# Patient Record
Sex: Female | Born: 1945
Health system: Southern US, Community
[De-identification: ages and names within clinical notes are randomized; demographics above are authoritative.]

## PROBLEM LIST (undated history)

## (undated) DIAGNOSIS — H9313 Tinnitus, bilateral: Secondary | ICD-10-CM

## (undated) DIAGNOSIS — D649 Anemia, unspecified: Secondary | ICD-10-CM

## (undated) DIAGNOSIS — I10 Essential (primary) hypertension: Secondary | ICD-10-CM

## (undated) DIAGNOSIS — R9431 Abnormal electrocardiogram [ECG] [EKG]: Secondary | ICD-10-CM

## (undated) DIAGNOSIS — Z789 Other specified health status: Secondary | ICD-10-CM

## (undated) DIAGNOSIS — E785 Hyperlipidemia, unspecified: Secondary | ICD-10-CM

## (undated) HISTORY — PX: COLONOSCOPY: SHX174

## (undated) HISTORY — DX: Essential (primary) hypertension: I10

## (undated) HISTORY — DX: Abnormal electrocardiogram (ECG) (EKG): R94.31

## (undated) HISTORY — PX: ARM SKIN LESION BIOPSY / EXCISION: SUR471

## (undated) HISTORY — DX: Other specified health status: Z78.9

## (undated) HISTORY — DX: Hyperlipidemia, unspecified: E78.5

---

## 1975-01-03 HISTORY — PX: TUBAL LIGATION: SHX77

## 1983-01-03 DIAGNOSIS — I10 Essential (primary) hypertension: Secondary | ICD-10-CM

## 1983-01-03 HISTORY — DX: Essential (primary) hypertension: I10

## 2000-08-01 ENCOUNTER — Encounter: Payer: Self-pay | Admitting: Family Medicine

## 2000-08-01 ENCOUNTER — Ambulatory Visit (HOSPITAL_COMMUNITY): Admission: RE | Admit: 2000-08-01 | Discharge: 2000-08-01 | Payer: Self-pay | Admitting: Family Medicine

## 2000-08-20 ENCOUNTER — Other Ambulatory Visit: Admission: RE | Admit: 2000-08-20 | Discharge: 2000-08-20 | Payer: Self-pay | Admitting: *Deleted

## 2004-09-26 ENCOUNTER — Emergency Department (HOSPITAL_COMMUNITY): Admission: EM | Admit: 2004-09-26 | Discharge: 2004-09-26 | Payer: Self-pay | Admitting: Emergency Medicine

## 2009-01-02 HISTORY — PX: CATARACT EXTRACTION: SUR2

## 2009-07-10 ENCOUNTER — Emergency Department (HOSPITAL_COMMUNITY): Admission: EM | Admit: 2009-07-10 | Discharge: 2009-07-10 | Payer: Self-pay | Admitting: Emergency Medicine

## 2009-10-21 ENCOUNTER — Encounter: Payer: Self-pay | Admitting: Family Medicine

## 2009-12-02 ENCOUNTER — Ambulatory Visit: Payer: Self-pay | Admitting: Family Medicine

## 2009-12-02 DIAGNOSIS — I10 Essential (primary) hypertension: Secondary | ICD-10-CM | POA: Insufficient documentation

## 2009-12-05 DIAGNOSIS — L0291 Cutaneous abscess, unspecified: Secondary | ICD-10-CM | POA: Insufficient documentation

## 2009-12-05 DIAGNOSIS — L0390 Cellulitis, unspecified: Secondary | ICD-10-CM

## 2009-12-09 ENCOUNTER — Ambulatory Visit (HOSPITAL_COMMUNITY)
Admission: RE | Admit: 2009-12-09 | Discharge: 2009-12-09 | Payer: Self-pay | Source: Home / Self Care | Attending: Ophthalmology | Admitting: Ophthalmology

## 2009-12-16 ENCOUNTER — Ambulatory Visit (HOSPITAL_COMMUNITY)
Admission: RE | Admit: 2009-12-16 | Discharge: 2009-12-16 | Payer: Self-pay | Source: Home / Self Care | Attending: Family Medicine | Admitting: Family Medicine

## 2010-01-10 ENCOUNTER — Telehealth: Payer: Self-pay | Admitting: Family Medicine

## 2010-01-12 ENCOUNTER — Encounter: Payer: Self-pay | Admitting: Family Medicine

## 2010-01-13 ENCOUNTER — Ambulatory Visit (HOSPITAL_COMMUNITY)
Admission: RE | Admit: 2010-01-13 | Discharge: 2010-01-13 | Payer: Self-pay | Source: Home / Self Care | Attending: Ophthalmology | Admitting: Ophthalmology

## 2010-01-14 LAB — CONVERTED CEMR LAB
BUN: 16 mg/dL (ref 6–23)
CO2: 28 meq/L (ref 19–32)
Calcium: 9.8 mg/dL (ref 8.4–10.5)
Chloride: 102 meq/L (ref 96–112)
Creatinine, Ser: 0.96 mg/dL (ref 0.40–1.20)
Glucose, Bld: 94 mg/dL (ref 70–99)
Potassium: 3.4 meq/L — ABNORMAL LOW (ref 3.5–5.3)
Sodium: 141 meq/L (ref 135–145)

## 2010-01-31 ENCOUNTER — Encounter: Payer: Self-pay | Admitting: Family Medicine

## 2010-01-31 ENCOUNTER — Ambulatory Visit: Admit: 2010-01-31 | Payer: Self-pay | Admitting: Family Medicine

## 2010-02-01 NOTE — Letter (Signed)
Summary: Letter to [facility] appt.  Letter to [facility] appt.   Imported By: Curtis Sites 10/22/2009 15:17:06  _____________________________________________________________________  External Attachment:    Type:   Image     Comment:   External Document

## 2010-02-01 NOTE — Assessment & Plan Note (Signed)
Summary: NEW PATIENT   Vital Signs:  Patient profile:   65 year old female Menstrual status:  postmenopausal Height:      66 inches Weight:      184.75 pounds BMI:     29.93 O2 Sat:      98 % on Room air Pulse rate:   82 / minute Pulse rhythm:   regular Resp:     16 per minute BP sitting:   160 / 90  (left arm)  Vitals Entered By: Adella Hare LPN (December 02, 2009 2:37 PM)  Nutrition Counseling: Patient's BMI is greater than 25 and therefore counseled on weight management options.  O2 Flow:  Room air CC: new patient Is Patient Diabetic? No Pain Assessment Patient in pain? no          Menstrual Status postmenopausal   CC:  new patient.  History of Present Illness: Reports  thatshe has been doing well. she has a long h/o htN, recently during preop for surgery, her blood pressure was found to be elevated, she states that often her med makes her "feel badly' when she takes it.  Denies recent fever or chills. Denies sinus pressure, nasal congestion , ear pain or sore throat. Denies chest congestion, or cough productive of sputum. Denies chest pain, palpitations, PND, orthopnea or leg swelling. Denies abdominal pain, nausea, vomitting, diarrhea or constipation. Denies change in bowel movements or bloody stool. Denies dysuria , frequency, incontinence or hesitancy. Denies  joint pain, swelling, or reduced mobility. Denies headaches, vertigo, seizures. Denies depression, anxiety or insomnia. Denies  rash,  or itch.Concerned about an erythematous lesion on right ant chest wall which is painful.     Preventive Screening-Counseling & Management  Alcohol-Tobacco     Smoking Status: never  Caffeine-Diet-Exercise     Does Patient Exercise: no      Drug Use:  no.    Current Medications (verified): 1)  Amlodipine Besylate 10 Mg Tabs (Amlodipine Besylate) .... One Tab By Mouth Once Daily 2)  Maxzide 75-50 Mg Tabs (Triamterene-Hctz) .... One Half By Mouth Once  Daily  Allergies (verified): No Known Drug Allergies  Past History:  Past Medical History: Hypertension  in mid 30's Right cataract  to be extracted in 2011  Past Surgical History: Tubal ligation  Family History: mother deceased- DM, COPD father deceased- pneumonia 3 sisters- HTN x1 3 brothers- DM x1,   Social History: Retired Married 3 grown children Never Smoked Alcohol use-no Drug use-no Regular exercise-no Smoking Status:  never Drug Use:  no Does Patient Exercise:  no  Review of Systems      See HPI General:  Complains of fatigue. Eyes:  Complains of vision loss-both eyes. Derm:  Complains of lesion(s). Endo:  Denies cold intolerance, excessive hunger, excessive thirst, excessive urination, and heat intolerance. Heme:  Denies abnormal bruising and bleeding. Allergy:  Denies hives or rash, itching eyes, and persistent infections.  Physical Exam  General:  Well-developed,well-nourished,in no acute distress; alert,appropriate and cooperative throughout examination HEENT: No facial asymmetry,  EOMI, No sinus tenderness, TM's Clear, oropharynx  pink and moist.   Chest: Clear to auscultation bilaterally.  CVS: S1, S2, No murmurs, No S3.   Abd: Soft, Nontender.  MS: Adequate ROM spine, hips, shoulders and knees.  Ext: No edema.   CNS: CN 2-12 intact, power tone and sensation normal throughout.   Skin: Intact, erythematous purulent appearing lesion on left ant chest. Psych: Good eye contact, normal affect.  Memory intact, not anxious  or depressed appearing.    Impression & Recommendations:  Problem # 1:  HYPERTENSION (ICD-401.9) Assessment Comment Only  Her updated medication list for this problem includes:    Triamterene-hctz 75-50 Mg Tabs (Triamterene-hctz) .Marland Kitchen... Take 1 tablet by mouth once a day    Amlodipine Besylate 10 Mg Tabs (Amlodipine besylate) ..... One tablet at 10pm at night  Orders: Medicare Electronic Prescription [phone])  BP today:  160/90, uncontrolled, dose increase in maxzide instituted   Problem # 2:  Preventive Health Care (ICD-V70.0) Assessment: Comment Only immunization issues addressed  Problem # 3:  CELLULITIS (ICD-682.9) antibiotic oint prescribed  Complete Medication List: 1)  Triamterene-hctz 75-50 Mg Tabs (Triamterene-hctz) .... Take 1 tablet by mouth once a day 2)  Amlodipine Besylate 10 Mg Tabs (Amlodipine besylate) .... One tablet at 10pm at night 3)  Mupirocin 2 % Oint (Mupirocin) .... Apply twice daily to rash on ant chest wall for 5 days , then as needed for infection  Other Orders: Pneumococcal Vaccine (60454) Admin 1st Vaccine (09811) Tdap => 40yrs IM (91478) Admin of Any Addtl Vaccine (29562) Radiology Referral (Radiology)  Patient Instructions: 1)  cPE in 4 to 5 weeks 2)  new dose of the triamterene  75mg  ONE daily 3)  you will be referred for a mamogram 4)  med sent in for rash Prescriptions: MUPIROCIN 2 % OINT (MUPIROCIN) apply twice daily to rash on ant chest wall for 5 days , then as needed for infection  #45 gm x 0   Entered and Authorized by:   Syliva Overman MD   Signed by:   Syliva Overman MD on 12/02/2009   Method used:   Electronically to        [facility] (retail)       [address] [place], [place]  [address]       Ph: [phone]       Fax: [phone]   RxID:   [id number] AMLODIPINE BESYLATE 10 MG TABS (AMLODIPINE BESYLATE) one tablet at 10pm at night  #90 x 0   Entered and Authorized by:   Syliva Overman MD   Signed by:   Syliva Overman MD on 12/02/2009   Method used:   Electronically to        [facility] (retail)       [address] [place], [place]  [address]       Ph: [phone]       Fax: [phone]   RxID:   [id number] TRIAMTERENE-HCTZ 75-50 MG TABS (TRIAMTERENE-HCTZ) Take 1 tablet by mouth once a day  #90 x 0   Entered and Authorized by:   Syliva Overman MD   Signed by:   Syliva Overman MD on 12/02/2009   Method used:   Electronically to        [facility] (retail)       [address]       [place], [place]  [address]       Ph: [phone]       Fax: [phone]   RxID:   [id number]    Orders Added: 1)  New Patient Level IV [99204] 2)  Medicare Electronic Prescription [G8553] 3)  Pneumococcal Vaccine [90732] 4)  Admin 1st Vaccine [90471] 5)  Tdap => 7yrs  IM [90715] 6)  Admin of Any Addtl Vaccine [90472] 7)  Radiology Referral [Radiology]   Immunizations Administered:  Pneumonia Vaccine:    Vaccine Type: Pneumovax    Site: right deltoid    Mfr: Merck    Dose: 0.5 ml    Route: IM    Given by: Adella Hare LPN    Exp. Date: 03/19/2011    Lot #: [id number]    VIS given: 12/07/08 version given December 02, 2009.  Tetanus Vaccine:    Vaccine Type: Tdap    Site: left deltoid    Mfr: GlaxoSmithKline    Dose: 0.5 ml    Route: IM    Given by: Adella Hare LPN    Exp. Date: 10/22/2011    Lot #: [id number]    VIS given: 11/20/07 version given December 02, 2009.   Immunizations Administered:  Pneumonia Vaccine:    Vaccine Type: Pneumovax    Site: right deltoid    Mfr: Merck    Dose: 0.5 ml    Route: IM    Given by: Adella Hare LPN    Exp. Date: 03/19/2011    Lot #: [id number]    VIS given: 12/07/08 version given December 02, 2009.  Tetanus Vaccine:    Vaccine Type: Tdap    Site: left deltoid    Mfr: GlaxoSmithKline    Dose: 0.5 ml    Route: IM    Given by: Adella Hare LPN    Exp. Date: 10/22/2011    Lot #: [id number]    VIS given: 11/20/07 version given December 02, 2009.

## 2010-02-03 ENCOUNTER — Other Ambulatory Visit: Payer: Self-pay | Admitting: Family Medicine

## 2010-02-03 ENCOUNTER — Other Ambulatory Visit (HOSPITAL_COMMUNITY)
Admission: RE | Admit: 2010-02-03 | Discharge: 2010-02-03 | Disposition: A | Discharge status: Home / Self Care | Payer: Medicare FFS | Source: Ambulatory Visit | Attending: Family Medicine | Admitting: Family Medicine

## 2010-02-03 ENCOUNTER — Encounter (INDEPENDENT_AMBULATORY_CARE_PROVIDER_SITE_OTHER): Payer: 59 | Admitting: Family Medicine

## 2010-02-03 ENCOUNTER — Encounter: Payer: Self-pay | Admitting: Family Medicine

## 2010-02-03 DIAGNOSIS — H9209 Otalgia, unspecified ear: Secondary | ICD-10-CM

## 2010-02-03 DIAGNOSIS — R9431 Abnormal electrocardiogram [ECG] [EKG]: Secondary | ICD-10-CM | POA: Insufficient documentation

## 2010-02-03 DIAGNOSIS — R1011 Right upper quadrant pain: Secondary | ICD-10-CM | POA: Insufficient documentation

## 2010-02-03 DIAGNOSIS — Z0000 Encounter for general adult medical examination without abnormal findings: Secondary | ICD-10-CM

## 2010-02-03 DIAGNOSIS — I10 Essential (primary) hypertension: Secondary | ICD-10-CM

## 2010-02-03 DIAGNOSIS — Z01419 Encounter for gynecological examination (general) (routine) without abnormal findings: Secondary | ICD-10-CM | POA: Insufficient documentation

## 2010-02-03 HISTORY — DX: Abnormal electrocardiogram (ECG) (EKG): R94.31

## 2010-02-03 NOTE — Progress Notes (Signed)
Summary: blood pressure medicine  Phone Note Call from Patient   Summary of Call: her blood pressure medicine is making her hands and legs cramp up and would like a different kind of medicine send to [place] [facility] call back at [phone] to let her know Initial call taken by: Lind Guest,  January 10, 2010 1:53 PM  Follow-up for Phone Call        The maxzide is causing her to have cramps and she wants it changed to something else. [place] Follow-up by: Everitt Amber LPN,  January 10, 2010 2:01 PM  Additional Follow-up for Phone Call Additional follow up Details #1::        I recommend blood work to include chem 7 her potassium is likely low, also if she is willing to take potassium one daily with this it is worth tring before changing her med, she has an appt in 3 days , see if she will get chem 7 today or tomorrow , pls order, not as a stat, fax in potassium entered historically if she agrees, pls document if she does not Additional Follow-up by: Syliva Overman MD,  January 10, 2010 2:25 PM   New Allergies: ! MAXZIDE Additional Follow-up for Phone Call Additional follow up Details #2::    patient is not willing to do blood work or take potassium, states she does not like the bp med and doesnt want to take it Follow-up by: Adella Hare LPN,  January 11, 2010 9:29 AM  Additional Follow-up for Phone Call Additional follow up Details #3:: Details for Additional Follow-up Action Taken: advise pt and send order to pharmacy to d/s the maxzide , enter adverse s/e pls of cramps, pls ssen in benazeprill 20mg  one daily # 30 refill one in its place, pls let her knowif she develops , dry cough , or swelling of the lips this is an unwanted adverse side effect, she willneed to stop the med and let me know  Additional Follow-up by: Syliva Overman MD,  January 12, 2010 4:21 AM  New/Updated Medications: BENAZEPRIL HCL 20 MG TABS (BENAZEPRIL HCL) Take 1 tablet by mouth once a day KLOR-CON M20 20 MEQ  CR-TABS (POTASSIUM CHLORIDE CRYS CR) Take 1 tablet by mouth once a day New Allergies: ! MAXZIDEPrescriptions: BENAZEPRIL HCL 20 MG TABS (BENAZEPRIL HCL) Take 1 tablet by mouth once a day  #30 x 1   Entered by:   Everitt Amber LPN   Authorized by:   Syliva Overman MD   Signed by:   Everitt Amber LPN on 45/40/9811   Method used:   Printed then faxed to ...       [facility] (retail)       [address]       [place], [place]  [address]       Ph: [phone]       Fax: [phone]   RxID:   [id number] KLOR-CON M20 20 MEQ CR-TABS (POTASSIUM CHLORIDE CRYS CR) Take 1 tablet by mouth once a day  #30 x 2   Entered and Authorized by:   Syliva Overman MD   Signed by:   Syliva Overman MD on 01/10/2010   Method used:   Historical   RxID:   [id number]  New med sent in with letter to d/c maxzide. Patient did agree to get lab to check her potassium.

## 2010-02-03 NOTE — Letter (Signed)
Summary: Letter  Letter   Imported By: Lind Guest 01/12/2010 13:05:24  _____________________________________________________________________  External Attachment:    Type:   Image     Comment:   External Document

## 2010-02-04 ENCOUNTER — Other Ambulatory Visit: Payer: Self-pay | Admitting: Family Medicine

## 2010-02-04 DIAGNOSIS — R109 Unspecified abdominal pain: Secondary | ICD-10-CM

## 2010-02-07 ENCOUNTER — Encounter (INDEPENDENT_AMBULATORY_CARE_PROVIDER_SITE_OTHER): Payer: Self-pay

## 2010-02-08 ENCOUNTER — Ambulatory Visit (HOSPITAL_COMMUNITY): Payer: Medicare FFS

## 2010-02-09 NOTE — Assessment & Plan Note (Signed)
Summary: physical   Vital Signs:  Patient profile:   65 year old female Menstrual status:  postmenopausal Height:      66 inches Weight:      182 pounds BMI:     29.48 O2 Sat:      98 % Pulse rate:   74 / minute Pulse rhythm:   regular Resp:     16 per minute BP sitting:   160 / 92  (left arm) Cuff size:   regular  Vitals Entered By: Everitt Amber LPN (February 03, 2010 8:21 AM)  Nutrition Counseling: Patient's BMI is greater than 25 and therefore counseled on weight management options. CC: CPE   Vision Screening:Left eye w/o correction: 20 / 20 Right Eye w/o correction: 20 / 20 Both eyes w/o correction:  20/ 20  Color vision testing: normal      Vision Entered By: Everitt Amber LPN (February 03, 2010 8:30 AM)   CC:  CPE .  History of Present Illness: Reports  that she has been  doing well. Denies recent fever or chills. Denies sinus pressure, nasal congestion , ear pain or sore throat. Denies chest congestion, or cough productive of sputum. Denies palpitations, PND, orthopnea or leg swelling.Does have intermittent left chest discomfort Denies , nausea, vomitting, diarrhea or constipation. Denies change in bowel movements or bloody stool. Denies dysuria , frequency, incontinence or hesitancy. Denies  joint pain, swelling, or reduced mobility. Denies headaches, vertigo, seizures. Denies depression, anxiety or insomnia. Denies  rash, lesions, or itch.     Current Medications (verified): 1)  Benazepril Hcl 20 Mg Tabs (Benazepril Hcl) .... Take 1 Tablet By Mouth Once A Day 2)  Amlodipine Besylate 10 Mg Tabs (Amlodipine Besylate) .... One Tablet At 10pm At Night 3)  Mupirocin 2 % Oint (Mupirocin) .... Apply Twice Daily To Rash On Ant Chest Wall For 5 Days , Then As Needed For Infection 4)  Klor-Con M20 20 Meq Cr-Tabs (Potassium Chloride Crys Cr) .... Take 1 Tablet By Mouth Once A Day 5)  Ketorolac Tromethamine 0.4 % Soln (Ketorolac Tromethamine) .... One Drop in  Affected Eye Daily 6)  Zymaxid 0.5 % Soln (Gatifloxacin) .... One Drop in Left Eye Daily 7)  Prednisolone Acetate 1 % Susp (Prednisolone Acetate) .... One Drop in Left Eye Two Times A Day  Allergies (verified): 1)  ! Maxzide  Past History:  Past medical history reviewed for relevance to current acute and chronic problems.  Past Medical History: Reviewed history from 12/02/2009 and no changes required. Hypertension  in mid 30's Right cataract  to be extracted in 2011  Past Surgical History: Tubal ligation, 1977 left cataract extractio and right cataract extraction in 2011  Review of Systems      See HPI Eyes:  Denies discharge and red eye. ENT:  Complains of earache; left ear pressure x 3 weeks, she has chronic tinnitus for yars, sytill has left earache. CV:  Complains of chest pain or discomfort; denies difficulty breathing while lying down, fainting, palpitations, shortness of breath with exertion, and swelling of hands; inteermittent left chest pain, no identifiable trigger. GI:  Complains of abdominal pain and gas; denies bloody stools, constipation, hemorrhoids, nausea, and vomiting; recurrent bloating and belching x months. Endo:  Denies cold intolerance, excessive hunger, excessive thirst, excessive urination, and polyuria. Heme:  Denies abnormal bruising and bleeding. Allergy:  Denies hives or rash and itching eyes.  Physical Exam  General:  Well-developed,well-nourished,in no acute distress; alert,appropriate and cooperative throughout  examination Head:  Normocephalic and atraumatic without obvious abnormalities. No apparent alopecia or balding. Eyes:  No corneal or conjunctival inflammation noted. EOMI. Perrla. Funduscopic exam benign, without hemorrhages, exudates or papilledema. Vision grossly normal. Ears:  External ear exam shows no significant lesions or deformities.  Otoscopic examination reveals clear canals, tympanic membranes are intact bilaterally without  bulging, retraction, inflammation or discharge. Hearing is grossly normal bilaterally. Nose:  External nasal examination shows no deformity or inflammation. Nasal mucosa are pink and moist without lesions or exudates. Mouth:  fair dentition and teeth missing.   Neck:  No deformities, masses, or tenderness noted. Chest Wall:  No deformities, masses, or tenderness noted. Breasts:  No mass, nodules, thickening, tenderness, bulging, retraction, inflamation, nipple discharge or skin changes noted.   Lungs:  Normal respiratory effort, chest expands symmetrically. Lungs are clear to auscultation, no crackles or wheezes. Heart:  Normal rate and regular rhythm. S1 and S2 normal without gallop, murmur, click, rub or other extra sounds. Abdomen:  Bowel sounds positive,abdomen soft and non-tender without masses, organomegaly or hernias noted. Rectal:  No external abnormalities noted. Normal sphincter tone. No rectal masses or tenderness. Genitalia:  Normal introitus for age, no external lesions, no vaginal discharge, mucosa pink and moist, no vaginal or cervical lesions, no vaginal atrophy, no friaility or hemorrhage, normal uterus size and position, no adnexal masses or tenderness Msk:  No deformity or scoliosis noted of thoracic or lumbar spine.   Pulses:  R and L carotid,radial,femoral,dorsalis pedis and posterior tibial pulses are full and equal bilaterally Extremities:  No clubbing, cyanosis, edema, or deformity noted with normal full range of motion of all joints.   Neurologic:  No cranial nerve deficits noted. Station and gait are normal. Plantar reflexes are down-going bilaterally. DTRs are symmetrical throughout. Sensory, motor and coordinative functions appear intact. Skin:  Intact without suspicious lesions or rashes Cervical Nodes:  No lymphadenopathy noted Axillary Nodes:  No palpable lymphadenopathy Inguinal Nodes:  No significant adenopathy Psych:  Cognition and judgment appear intact. Alert  and cooperative with normal attention span and concentration. No apparent delusions, illusions, hallucinations   Impression & Recommendations:  Problem # 1:  ABDOMINAL PAIN RIGHT UPPER QUADRANT (ICD-789.01) Assessment Comment Only  Orders: Radiology Referral (Radiology)  Problem # 2:  ABNORMAL ELECTROCARDIOGRAM (ICD-794.31) Assessment: Comment Only  Future Orders: Cardiology Referral (Cardiology) ... 02/08/2010, occasional chest pain, and htn  Problem # 3:  HYPERTENSION (ICD-401.9) Assessment: Unchanged  Her updated medication list for this problem includes:    Benazepril Hcl 20 Mg Tabs (Benazepril hcl) .Marland Kitchen... Take 1 tablet by mouth once a day    Amlodipine Besylate 10 Mg Tabs (Amlodipine besylate) ..... One tablet at 10pm at night    Hydrochlorothiazide 25 Mg Tabs (Hydrochlorothiazide) .Marland Kitchen... Take 1 tablet by mouth once a day  Orders: EKG w/ Interpretation (93000) Medicare Electronic Prescription [id number]) EKG w/ Interpretation (93000) T-Basic Metabolic Panel (98119-14782)  BP today: 160/92 Prior BP: 160/90 (12/02/2009)  Labs Reviewed: K+: 3.4 (01/12/2010) Creat: : 0.96 (01/12/2010)     Problem # 4:  PHYSICAL EXAMINATION (ICD-V70.0) Assessment: Comment Only pap sent , stool is guaic neg  Problem # 5:  EAR PAIN, LEFT (ICD-388.70) Assessment: Comment Only  Her updated medication list for this problem includes:    Auralgan 1.4-5.5 % Soln (Benzocaine-antipyrine) ..... One drop 3 times daily to left ear for 5 days , then as needed for pain/discomfort  Orders: Medicare Electronic Prescription [phone])  Discussed symptom control.   Complete Medication List:  1)  Benazepril Hcl 20 Mg Tabs (Benazepril hcl) .... Take 1 tablet by mouth once a day 2)  Amlodipine Besylate 10 Mg Tabs (Amlodipine besylate) .... One tablet at 10pm at night 3)  Mupirocin 2 % Oint (Mupirocin) .... Apply twice daily to rash on ant chest wall for 5 days , then as needed for infection 4)  Klor-con M20  20 Meq Cr-tabs (Potassium chloride crys cr) .... Take 1 tablet by mouth once a day 5)  Ketorolac Tromethamine 0.4 % Soln (Ketorolac tromethamine) .... One drop in affected eye daily 6)  Zymaxid 0.5 % Soln (Gatifloxacin) .... One drop in left eye daily 7)  Prednisolone Acetate 1 % Susp (Prednisolone acetate) .... One drop in left eye two times a day 8)  Hydrochlorothiazide 25 Mg Tabs (Hydrochlorothiazide) .... Take 1 tablet by mouth once a day 9)  Auralgan 1.4-5.5 % Soln (Benzocaine-antipyrine) .... One drop 3 times daily to left ear for 5 days , then as needed for pain/discomfort 10)  Aspir-low 81 Mg Tbec (Aspirin) .... Take 1 tablet by mouth once a day 11)  Oscal 500/200 D-3 500-200 Mg-unit Tabs (Calcium carbonate-vitamin d) .... Take 1 tablet by mouth three times a day 12)  Womens Multivitamin Plus Tabs (Multiple vitamins-minerals) .... Take 1 tablet by mouth once a day  Other Orders: T-Lipid Profile (95621-30865) T-CBC w/Diff (78469-62952) T-TSH [phone]) Gastroenterology Referral (GI)  Patient Instructions: 1)  f/u in 6 to 8 weeks. 2)  BMP prior to visit, ICD-9: 3)  Lipid Panel prior to visit, ICD-9:  fasting in 1 week 4)  TSH prior to visit, ICD-9: 5)  CBC w/ Diff prior to visit, ICD-9: 6)  Your blood pressure is still too high, and additional med is added, pls continue what you are already traking. 7)  An ear drop is prescribed for your left ear, pls use as directed, and stop using q tips in the ear. 8)  For routine health calcium is recommended for bones, and one multivit daily 9)  Take an Aspirin every day. 10)  We will schedule a colonoscopy/sigmoidoscopy to help detect colon cancer. 11)  You are being referred to the cardiologist since your EKG is abnormal. 12)  You will be referred for gallbladder studies since you c/o right upper abdominal pain with bloating Prescriptions: WOMENS MULTIVITAMIN PLUS  TABS (MULTIPLE VITAMINS-MINERALS) Take 1 tablet by mouth once a day  #90  x 3   Entered and Authorized by:   Syliva Overman MD   Signed by:   Syliva Overman MD on 02/03/2010   Method used:   Electronically to        [facility] (retail)       [address]       [place], [place]  [address]       Ph: [phone]       Fax: [phone]   RxID:   [id number] OSCAL 500/200 D-3 500-200 MG-UNIT TABS (CALCIUM CARBONATE-VITAMIN D) Take 1 tablet by mouth three times a day  #270 x 3   Entered and Authorized by:   Syliva Overman MD   Signed by:   Syliva Overman MD on 02/03/2010   Method used:   Electronically to        [facility] (retail)       [address]       [place], [place]  [address]  Ph: [phone]       Fax: [phone]   RxID:   [id number] ASPIR-LOW 81 MG TBEC (ASPIRIN) Take 1 tablet by mouth once a day  #90 x 3   Entered and Authorized by:   Syliva Overman MD   Signed by:   Syliva Overman MD on 02/03/2010   Method used:   Electronically to        [facility] (retail)       [address] [place], [place]  [address]       Ph: [phone]       Fax: [phone]   RxID:   [id number] AURALGAN 1.4-5.5 % SOLN (BENZOCAINE-ANTIPYRINE) one drop 3 times daily to left ear for 5 days , then as needed for pain/discomfort  #25ml x 0   Entered and Authorized by:   Syliva Overman MD   Signed by:   Syliva Overman MD on 02/03/2010   Method used:   Electronically to        [facility] (retail)       [address] [place], [place]  [address]       Ph: [phone]       Fax: [phone]   RxID:   [id number] HYDROCHLOROTHIAZIDE 25 MG TABS (HYDROCHLOROTHIAZIDE) Take 1 tablet by mouth once a day  #30 x 3   Entered and Authorized by:   Syliva Overman MD   Signed by:   Syliva Overman MD on 02/03/2010   Method used:   Electronically to        [facility]  (retail)       [address]       [place], [place]  [address]       Ph: [phone]       Fax: [phone]   RxID:   [id number]    Orders Added: 1)  Est. Patient 65& > [99397] 2)  EKG w/ Interpretation [93000] 3)  Medicare Electronic Prescription [G8553] 4)  EKG w/ Interpretation [93000] 5)  Radiology Referral [Radiology] 6)  T-Basic Metabolic Panel [80048-22910] 7)  T-Lipid Profile [80061-22930] 8)  T-CBC w/Diff [78242-35361] 9)  T-TSH [44315-40086] 10)  Gastroenterology Referral [GI] 11)  Cardiology Referral [Cardiology]

## 2010-02-09 NOTE — Letter (Signed)
Summary: [facility]   Imported By: Lind Guest 01/31/2010 10:06:30  _____________________________________________________________________  External Attachment:    Type:   Image     Comment:   External Document

## 2010-02-17 NOTE — Letter (Signed)
Summary: Pap Smear, Normal Letter, [facility]  [address]   [place], [place] [address]   Phone: [phone]  Fax: [phone]          February 07, 2010    Dear: Sara Haynes    I am pleased to notify you that your PAP smear was normal.  You will need your next PAP smear in:     ____ 3 Months    ____ 6 Months    ____ 12 Months    Please call the office at our office number above, to schedule your next appointment.    Sincerely,     [facility]

## 2010-02-25 ENCOUNTER — Encounter: Payer: Self-pay | Admitting: Family Medicine

## 2010-03-01 NOTE — Letter (Signed)
[facility]   Imported By: Lind Guest 02/25/2010 11:30:14  _____________________________________________________________________  External Attachment:    Type:   Image     Comment:   External Document

## 2010-03-10 ENCOUNTER — Encounter: Payer: Self-pay | Admitting: Family Medicine

## 2010-03-14 LAB — BASIC METABOLIC PANEL
BUN: 22 mg/dL (ref 6–23)
CO2: 29 mEq/L (ref 19–32)
Calcium: 9.9 mg/dL (ref 8.4–10.5)
Creatinine, Ser: 1.1 mg/dL (ref 0.4–1.2)
GFR calc non Af Amer: 50 mL/min — ABNORMAL LOW (ref >60)
Glucose, Bld: 98 mg/dL (ref 70–99)
Sodium: 138 mEq/L (ref 135–145)

## 2010-03-20 LAB — BASIC METABOLIC PANEL
CO2: 27 mEq/L (ref 19–32)
Calcium: 9.6 mg/dL (ref 8.4–10.5)
Chloride: 103 mEq/L (ref 96–112)
Creatinine, Ser: 0.9 mg/dL (ref 0.4–1.2)
GFR calc Af Amer: >60 mL/min (ref >60)
Glucose, Bld: 129 mg/dL — ABNORMAL HIGH (ref 70–99)

## 2010-03-22 ENCOUNTER — Encounter: Payer: Self-pay | Admitting: Family Medicine

## 2010-03-22 NOTE — Letter (Signed)
[facility]   Imported By: Lind Guest 03/15/2010 14:45:30  _____________________________________________________________________  External Attachment:    Type:   Image     Comment:   External Document

## 2010-03-25 ENCOUNTER — Ambulatory Visit: Payer: 59 | Admitting: Family Medicine

## 2010-04-26 ENCOUNTER — Encounter: Payer: Self-pay | Admitting: Family Medicine

## 2010-04-27 ENCOUNTER — Encounter: Payer: Self-pay | Admitting: Family Medicine

## 2010-04-28 ENCOUNTER — Encounter: Payer: Self-pay | Admitting: Family Medicine

## 2010-04-28 ENCOUNTER — Ambulatory Visit (INDEPENDENT_AMBULATORY_CARE_PROVIDER_SITE_OTHER): Payer: 59 | Admitting: Family Medicine

## 2010-04-28 VITALS — BP 180/110 | HR 68 | Resp 16 | Ht 66.5 in | Wt 185.0 lb

## 2010-04-28 DIAGNOSIS — R7301 Impaired fasting glucose: Secondary | ICD-10-CM

## 2010-04-28 DIAGNOSIS — R5381 Other malaise: Secondary | ICD-10-CM

## 2010-04-28 DIAGNOSIS — Z79899 Other long term (current) drug therapy: Secondary | ICD-10-CM

## 2010-04-28 DIAGNOSIS — I10 Essential (primary) hypertension: Secondary | ICD-10-CM

## 2010-04-28 DIAGNOSIS — R5383 Other fatigue: Secondary | ICD-10-CM

## 2010-04-28 MED ORDER — BENAZEPRIL HCL 20 MG PO TABS: 20.0000 mg | ORAL_TABLET | Freq: Two times a day (BID) | ORAL | Status: DC

## 2010-04-28 NOTE — Patient Instructions (Addendum)
F/u in 6 weeks.   The dose of benazepril 20mg  is increased to twice daily, pls take at 9am and 9pm.  Cbc , fasting chem7, lipid , tsh in 6 weeks  PLS CALL AND RESCHEDULE YOUR COLONSCOPY

## 2010-04-29 LAB — BASIC METABOLIC PANEL
CO2: 26 mEq/L (ref 19–32)
Calcium: 9.6 mg/dL (ref 8.4–10.5)
Creat: 0.94 mg/dL (ref 0.40–1.20)
Glucose, Bld: 88 mg/dL (ref 70–99)

## 2010-04-29 LAB — CBC WITH DIFFERENTIAL/PLATELET
Basophils Relative: 1 % (ref 0–1)
Eosinophils Absolute: 0.1 10*3/uL (ref 0.0–0.7)
Eosinophils Relative: 1 % (ref 0–5)
Lymphs Abs: 2.1 10*3/uL (ref 0.7–4.0)
MCH: 29.3 pg (ref 26.0–34.0)
MCHC: 32.6 g/dL (ref 30.0–36.0)
MCV: 90.1 fL (ref 78.0–100.0)
Monocytes Relative: 7 % (ref 3–12)
Neutrophils Relative %: 43 % (ref 43–77)
Platelets: 212 10*3/uL (ref 150–400)
RBC: 3.92 MIL/uL (ref 3.87–5.11)

## 2010-04-29 LAB — TSH: TSH: 1.17 u[IU]/mL (ref 0.350–4.500)

## 2010-04-29 LAB — LIPID PANEL: Cholesterol: 219 mg/dL — ABNORMAL HIGH (ref 0–200)

## 2010-05-01 NOTE — Progress Notes (Signed)
  Subjective:    Patient ID: Sara Haynes, female    DOB: 1945/02/24, 65 y.o.   MRN: [id number]  HPI Pt here for f/u of uncontrolled hypertension. She has seen the cardiologist since her last visit, had another med adjustment , again due to cramps on the med she had prescribed. She appears intolerant of diuretics.    Review of Systems Denies recent fever or chills. Denies sinus pressure, nasal congestion, ear pain or sore throat. Denies chest congestion, productive cough or wheezing. Denies chest pains, palpitations, paroxysmal nocturnal dyspnea, orthopnea and leg swelling Denies abdominal pain, nausea, vomiting,diarrhea or constipation.  Denies rectal bleeding or change in bowel movement.       Objective:   Physical Exam    Patient alert and oriented and in no Cardiopulmonary distress.  HEENT: No facial asymmetry, EOMI, no sinus tenderness, TM's clear, Oropharynx pink and moist.  Neck supple no adenopathy.  Chest: Clear to auscultation bilaterally.  CVS: S1, S2 no murmurs, no S3.  ABD: Soft non tender. Bowel sounds normal.  Ext: No edema  MS: Adequate ROM spine, shoulders, hips and knees.  Skin: Intact, no ulcerations or rash noted.  Psych: Good eye contact, normal affect. Memory intact not anxious or depressed appearing.  CNS: CN 2-12 intact, power, tone and sensation normal throughout.     Assessment & Plan:

## 2010-05-01 NOTE — Assessment & Plan Note (Signed)
Medication compliance addressed. Commitment to regular exercise and healthy  food choices, with portion control discussed. DASH diet and low fat diet discussed and literature offered. Changes in medication made at this visit.  

## 2010-06-01 ENCOUNTER — Encounter: Payer: Self-pay | Admitting: Family Medicine

## 2010-06-03 ENCOUNTER — Encounter: Payer: Self-pay | Admitting: Family Medicine

## 2010-06-03 ENCOUNTER — Ambulatory Visit (INDEPENDENT_AMBULATORY_CARE_PROVIDER_SITE_OTHER): Payer: 59 | Admitting: Family Medicine

## 2010-06-03 VITALS — BP 170/102 | HR 92 | Resp 16 | Ht 65.0 in | Wt 184.0 lb

## 2010-06-03 DIAGNOSIS — I10 Essential (primary) hypertension: Secondary | ICD-10-CM

## 2010-06-03 NOTE — Patient Instructions (Signed)
F/u in 6 to 8 weeks.  Your blood pressure is a little  better than the last visit but still  Too high. Today 170/102  Take benazepril/hctz HALF tablet every morning at 7am  Take benazepril 20mg  ONE tablet at 10pm  Call if unable to take the medication

## 2010-06-03 NOTE — Progress Notes (Signed)
  Subjective:    Patient ID: Sara Haynes, female    DOB: Dec 25, 1945, 65 y.o.   MRN: [id number]  HPI Pt reports intolerance to benaz/  hctz one daily, states she ges t drunk, did no take benazepr l20mg  twice dailty and switched back to only half of the combination tab. She has been testing daily and her blood pressure has never been under 140 and is often markedlyelevated. She denies headache, chest pain, palpitation, orthopnea or leg swelling.   Review of Systems Denies recent fever, chils, head or chest congestion    Objective:   Physical Exam Patient alert and oriented and in no Cardiopulmonary distress.  HEENT: No facial asymmetry, EOMI, no sinus tenderness, TM's clear, Oropharynx pink and moist.  Neck supple no adenopathy.  Chest: Clear to auscultation bilaterally.  CVS: S1, S2 no murmurs, no S3.  Skin: Intact, no ulcerations or rash noted.  Psych: Good eye contact, normal affect. Memory intact not anxious or depressed appearing.  CNS: CN 2-12 intact, power, tone and sensation normal throughout.        Assessment & Plan:

## 2010-06-05 NOTE — Assessment & Plan Note (Signed)
Uncontrolled, pt to start benazepril one daily at bedtime and continue combination drug as before, she is to call if intolerant

## 2010-06-09 ENCOUNTER — Telehealth: Payer: Self-pay | Admitting: Family Medicine

## 2010-06-09 NOTE — Telephone Encounter (Signed)
   Pt advised to reduce to half tab in the morning and half at bedtime, she is willing to try this, she is intolerant of her bp med                      Pt advised

## 2010-08-03 ENCOUNTER — Encounter: Payer: Self-pay | Admitting: Family Medicine

## 2010-08-04 ENCOUNTER — Ambulatory Visit (INDEPENDENT_AMBULATORY_CARE_PROVIDER_SITE_OTHER): Payer: 59 | Admitting: Family Medicine

## 2010-08-04 ENCOUNTER — Encounter: Payer: Self-pay | Admitting: Family Medicine

## 2010-08-04 VITALS — BP 150/90 | HR 87 | Resp 16 | Ht 66.5 in | Wt 185.1 lb

## 2010-08-04 DIAGNOSIS — Z13220 Encounter for screening for lipoid disorders: Secondary | ICD-10-CM

## 2010-08-04 DIAGNOSIS — M5380 Other specified dorsopathies, site unspecified: Secondary | ICD-10-CM

## 2010-08-04 DIAGNOSIS — I10 Essential (primary) hypertension: Secondary | ICD-10-CM

## 2010-08-04 DIAGNOSIS — M62830 Muscle spasm of back: Secondary | ICD-10-CM

## 2010-08-04 MED ORDER — HYDROCHLOROTHIAZIDE 12.5 MG PO CAPS: 12.5000 mg | ORAL_CAPSULE | Freq: Every day | ORAL | Status: DC

## 2010-08-04 MED ORDER — BENAZEPRIL-HYDROCHLOROTHIAZIDE 10-12.5 MG PO TABS: 1.0000 | ORAL_TABLET | Freq: Every day | ORAL | Status: DC

## 2010-08-04 MED ORDER — BENAZEPRIL HCL 10 MG PO TABS: 10.0000 mg | ORAL_TABLET | Freq: Every day | ORAL | Status: DC

## 2010-08-04 NOTE — Progress Notes (Signed)
  Subjective:    Patient ID: Sara Haynes, female    DOB: December 05, 1945, 66 y.o.   MRN: [id number]  HPI  1 week h/o left flank pain after lifting clothes worse when she twists. She is here primarily for f/u of uncontrolled blood pressure. She reports being able to tolerate her medication well and denies light headedness, cramps or cough  Review of Systems Denies recent fever or chills. Denies sinus pressure, nasal congestion, ear pain or sore throat. Denies chest congestion, productive cough or wheezing. Denies chest pains, palpitations and leg swelling Denies abdominal pain, nausea, vomiting,diarrhea or constipation.   Denies dysuria, frequency, hesitancy or incontinence. Denies joint pain or  swelling Denies headaches, seizures, numbness, or tingling. Denies depression, anxiety or insomnia. Denies skin break down or rash.        Objective:   Physical Exam Patient alert and oriented and in no cardiopulmonary distress.  HEENT: No facial asymmetry, EOMI, no sinus tenderness,  oropharynx pink and moist.  Neck supple no adenopathy.  Chest: Clear to auscultation bilaterally.  CVS: S1, S2 no murmurs, no S3.  ABD: Soft non tender. Bowel sounds normal.  Ext: No edema  MS: Adequate ROM spine, shoulders, hips and knees.  Skin: Intact, no ulcerations or rash noted.  Psych: Good eye contact, normal affect. Memory intact not anxious or depressed appearing.  CNS: CN 2-12 intact, power, tone and sensation normal throughout.        Assessment & Plan:

## 2010-08-04 NOTE — Patient Instructions (Addendum)
F/U end October or early November.  Fasting lipid, and chem 7 ,  3 to 5 days before next visit..  Today chem 7  Blood pressure is much better.  Continue medication  At the dose you were taking.  PLEASE NOTE your new tablets are HALF as strong , so do NOT break in half, take the WHOLE New additional medicine to be taken once daily

## 2010-08-05 DIAGNOSIS — M62830 Muscle spasm of back: Secondary | ICD-10-CM | POA: Insufficient documentation

## 2010-08-05 LAB — BASIC METABOLIC PANEL
CO2: 29 mEq/L (ref 19–32)
Chloride: 102 mEq/L (ref 96–112)
Glucose, Bld: 76 mg/dL (ref 70–99)
Potassium: 3.8 mEq/L (ref 3.5–5.3)
Sodium: 140 mEq/L (ref 135–145)

## 2010-08-05 NOTE — Assessment & Plan Note (Signed)
1 week h/o acute back spasm, pt will use anti inflammatories and compresses

## 2010-08-05 NOTE — Assessment & Plan Note (Signed)
Improved though sub-optimal control, pt able to "tolerate" current dosing. Add HCTZ 12.5mg  one daily

## 2010-08-17 ENCOUNTER — Telehealth: Payer: Self-pay | Admitting: Family Medicine

## 2010-08-17 NOTE — Telephone Encounter (Signed)
We have the fax form from pharmacy and will complete and fax back

## 2010-11-02 ENCOUNTER — Encounter: Payer: Self-pay | Admitting: Family Medicine

## 2010-11-03 HISTORY — PX: EYE SURGERY: SHX253

## 2010-11-03 LAB — BASIC METABOLIC PANEL
Calcium: 9.9 mg/dL (ref 8.4–10.5)
Glucose, Bld: 115 mg/dL — ABNORMAL HIGH (ref 70–99)
Sodium: 141 mEq/L (ref 135–145)

## 2010-11-03 LAB — LIPID PANEL
HDL: 48 mg/dL (ref >39)
LDL Cholesterol: 139 mg/dL — ABNORMAL HIGH (ref 0–99)
Total CHOL/HDL Ratio: 4.5 Ratio
VLDL: 29 mg/dL (ref 0–40)

## 2010-11-04 ENCOUNTER — Ambulatory Visit (INDEPENDENT_AMBULATORY_CARE_PROVIDER_SITE_OTHER): Payer: 59 | Admitting: Family Medicine

## 2010-11-04 ENCOUNTER — Encounter: Payer: Self-pay | Admitting: Family Medicine

## 2010-11-04 VITALS — BP 180/100 | HR 70 | Resp 16 | Ht 66.5 in | Wt 188.1 lb

## 2010-11-04 DIAGNOSIS — R7301 Impaired fasting glucose: Secondary | ICD-10-CM

## 2010-11-04 DIAGNOSIS — E785 Hyperlipidemia, unspecified: Secondary | ICD-10-CM

## 2010-11-04 DIAGNOSIS — Z23 Encounter for immunization: Secondary | ICD-10-CM

## 2010-11-04 DIAGNOSIS — R7303 Prediabetes: Secondary | ICD-10-CM

## 2010-11-04 DIAGNOSIS — I10 Essential (primary) hypertension: Secondary | ICD-10-CM

## 2010-11-04 DIAGNOSIS — R7309 Other abnormal glucose: Secondary | ICD-10-CM

## 2010-11-04 HISTORY — DX: Hyperlipidemia, unspecified: E78.5

## 2010-11-04 MED ORDER — AMLODIPINE BESYLATE 2.5 MG PO TABS: 2.5000 mg | ORAL_TABLET | Freq: Every day | ORAL | Status: DC

## 2010-11-04 MED ORDER — BENAZEPRIL-HYDROCHLOROTHIAZIDE 10-12.5 MG PO TABS: 1.0000 | ORAL_TABLET | Freq: Every day | ORAL | Status: DC

## 2010-11-04 NOTE — Progress Notes (Signed)
  Subjective:    Patient ID: Sara Haynes, female    DOB: [birth year], 65 y.o.   MRN: [id number]  HPI The PT is here for follow up and re-evaluation of chronic medical conditions, medication management and review of any available recent lab and radiology data.  Preventive health is updated, specifically  Cancer screening and Immunization.   Questions or concerns regarding consultations or procedures which the PT has had in the interim are  addressed. The PT reports adverse reactions to antihypertensive  Medication prescribed and has been taking half the prescribed dose. Reports increased stress since her spouse recently dx with an aneurysm and will need surgery           Review of Systems See HPI Denies recent fever or chills. Denies sinus pressure, nasal congestion, ear pain or sore throat. Denies chest congestion, productive cough or wheezing. Denies chest pains, palpitations and leg swelling Denies abdominal pain, nausea, vomiting,diarrhea or constipation.   Denies dysuria, frequency, hesitancy or incontinence. Denies joint pain, swelling and limitation in mobility. Denies headaches, seizures, numbness, or tingling. Denies depression, anxiety or insomnia. Denies skin break down or rash.        Objective:   Physical Exam   Patient alert and oriented and in no cardiopulmonary distress.  HEENT: No facial asymmetry, EOMI, no sinus tenderness,  oropharynx pink and moist.  Neck supple no adenopathy.  Chest: Clear to auscultation bilaterally.  CVS: S1, S2 no murmurs, no S3.  ABD: Soft non tender. Bowel sounds normal.  Ext: No edema  MS: Adequate ROM spine, shoulders, hips and knees.  Skin: Intact, no ulcerations or rash noted.  Psych: Good eye contact, normal affect. Memory intact not anxious or depressed appearing.  CNS: CN 2-12 intact, power, tone and sensation normal throughout.      Assessment & Plan:

## 2010-11-04 NOTE — Assessment & Plan Note (Signed)
Uncontrolled pt non  compliant with med, multiple c/o intolerable side effects , she does not call in , but turns up for appts stating she had not taken the med as prescribed.

## 2010-11-04 NOTE — Patient Instructions (Signed)
F/u in first week in  January.  New additional medication for blood pressure, amlodipine 2.5mg  daily, and new tablet for blood pressure at the dose you ar taking so you will not need to break this benazepril tablet in half  HBA1C in January before f/u  Flu vaccine today.  Your cholesterol is high, you need to reduce fried and fatty foods, butter, cheese, oil and red meats  Your blood sugar is too high. You need to cut back on sweets, cake, ice cream, sweetened drinks and learn how to measure you carbs at meals. Pls register for a class at the hospital, this is free

## 2010-11-04 NOTE — Assessment & Plan Note (Signed)
Low fat diet discussed and encouraged, no med at this time , recheck lipids prior to next visit

## 2010-12-31 LAB — HEMOGLOBIN A1C
Hgb A1c MFr Bld: 6.1 % — ABNORMAL HIGH (ref <5.7)
Mean Plasma Glucose: 128 mg/dL — ABNORMAL HIGH (ref <117)

## 2011-01-03 HISTORY — PX: EYE SURGERY: SHX253

## 2011-01-05 ENCOUNTER — Encounter: Payer: Self-pay | Admitting: Family Medicine

## 2011-01-06 ENCOUNTER — Ambulatory Visit (INDEPENDENT_AMBULATORY_CARE_PROVIDER_SITE_OTHER): Payer: 59 | Admitting: Family Medicine

## 2011-01-06 ENCOUNTER — Encounter: Payer: Self-pay | Admitting: Family Medicine

## 2011-01-06 VITALS — BP 200/110 | HR 86 | Resp 16 | Ht 66.5 in | Wt 182.0 lb

## 2011-01-06 DIAGNOSIS — I10 Essential (primary) hypertension: Secondary | ICD-10-CM

## 2011-01-06 DIAGNOSIS — E785 Hyperlipidemia, unspecified: Secondary | ICD-10-CM

## 2011-01-06 DIAGNOSIS — R7309 Other abnormal glucose: Secondary | ICD-10-CM

## 2011-01-06 DIAGNOSIS — R7303 Prediabetes: Secondary | ICD-10-CM

## 2011-01-06 MED ORDER — POTASSIUM CHLORIDE CRYS ER 20 MEQ PO TBCR: 20.0000 meq | EXTENDED_RELEASE_TABLET | Freq: Two times a day (BID) | ORAL | Status: DC

## 2011-01-06 MED ORDER — HYDROCHLOROTHIAZIDE 25 MG PO TABS: 25.0000 mg | ORAL_TABLET | Freq: Every day | ORAL | Status: DC

## 2011-01-06 NOTE — Patient Instructions (Signed)
F/U end April  You are being referred to cardiology for blood pressure control, your pressure is so high that it is putting you at risk for heart failure, stroke or kidney failure.  Until you see cardiology to help with blood pressure, take HCTZ and potassium for the blood pressure  Your cholesterol and blood sugars are too high, you need to reduce fatty food, sugar and carbs    Fasting lipid, chem 7, HBa1C  In end April before next visit

## 2011-01-07 DIAGNOSIS — R7303 Prediabetes: Secondary | ICD-10-CM | POA: Insufficient documentation

## 2011-01-07 NOTE — Progress Notes (Signed)
Subjective:     Patient ID: Sara Haynes, female   DOB: 1945/03/05, 66 y.o.   MRN: [id number]  HPI The PT is here for follow up and re-evaluation of chronic medical conditions, medication management and review of any available recent lab and radiology data.  Preventive health is updated, specifically  Cancer screening and Immunization.    Main reason for evaluation is uncontrolled stage 2 HTN, essentially , patient reports intolerance to all medication prescribed, and I have attempted approx 6 different combinations. Her blood pressure remains high, she is incapable of adhering to prescribed meds, stops the medication prior to returning faithfully for visits, still with blood pressure  markedly elevated. She is asymptomatic as far as her cardiovascular system is concerned, however I again review the silent organ damage which is occuring in the setting of severe uncontrolled HTN. At this point I believe that help from cardiology is warranted, I explain this to her and she is being referred for BP management   Review of Systems See HPI Denies recent fever or chills. Denies sinus pressure, nasal congestion, ear pain or sore throat. Denies chest congestion, productive cough or wheezing. Denies chest pains, palpitations and leg swelling Denies abdominal pain, nausea, vomiting,diarrhea or constipation.   Denies dysuria, frequency, hesitancy or incontinence. Denies joint pain, swelling and limitation in mobility. Denies headaches, seizures, numbness, or tingling. Denies depression, anxiety or insomnia. Denies skin break down or rash.        Objective:   Physical Exam Patient alert and oriented and in no cardiopulmonary distress.  HEENT: No facial asymmetry, EOMI, no sinus tenderness,  oropharynx pink and moist.  Neck supple no adenopathy.  Chest: Clear to auscultation bilaterally.  CVS: S1, S2 no murmurs, no S3.  ABD: Soft non tender. Bowel sounds normal.  Ext: No edema  MS:  Adequate ROM spine, shoulders, hips and knees.  Skin: Intact, no ulcerations or rash noted.  Psych: Good eye contact, normal affect. Memory intact not anxious or depressed appearing.  CNS: CN 2-12 intact, power, tone and sensation normal throughout.     Assessment:        Plan:

## 2011-01-07 NOTE — Assessment & Plan Note (Signed)
Elevated total and LDL cholesterol, low fat diet discussed and encouraged, no med at this time, rept lb in 4 month

## 2011-01-07 NOTE — Assessment & Plan Note (Signed)
Pos fam h/o diabetes, with abn hBA1C. Encouraged to attend group sessions on diabetic ed and diet, the importance of measuring carbs and limiting sweets is discussed

## 2011-01-07 NOTE — Assessment & Plan Note (Signed)
Stage 2 hTN, uncontrolled, pt reports intolerance to all medication , cardiology to help with management.  Danger of continuing to maintain such elevated pressure is stressed

## 2011-01-13 ENCOUNTER — Ambulatory Visit (INDEPENDENT_AMBULATORY_CARE_PROVIDER_SITE_OTHER): Payer: 59 | Admitting: Cardiology

## 2011-01-13 ENCOUNTER — Encounter: Payer: Self-pay | Admitting: Cardiology

## 2011-01-13 VITALS — BP 195/110 | HR 96 | Resp 16 | Ht 66.0 in | Wt 183.0 lb

## 2011-01-13 DIAGNOSIS — I10 Essential (primary) hypertension: Secondary | ICD-10-CM

## 2011-01-13 DIAGNOSIS — Z789 Other specified health status: Secondary | ICD-10-CM | POA: Insufficient documentation

## 2011-01-13 DIAGNOSIS — R9431 Abnormal electrocardiogram [ECG] [EKG]: Secondary | ICD-10-CM

## 2011-01-13 DIAGNOSIS — E785 Hyperlipidemia, unspecified: Secondary | ICD-10-CM

## 2011-01-13 MED ORDER — CLONIDINE HCL 0.2 MG/24HR TD PTWK: 1.0000 | MEDICATED_PATCH | TRANSDERMAL | Status: DC

## 2011-01-13 NOTE — Assessment & Plan Note (Signed)
EKG shows changes suggestive of LVH, but is otherwise unremarkable.  No further testing nor treatment is warranted beyond control of hypertension.

## 2011-01-13 NOTE — Assessment & Plan Note (Addendum)
I discussed the issues with medication and compliance with medication.  I indicated that minor adverse effects not infrequently decreased with time and the patient needs to give each medication a reasonable chance.  She appears to be more concerned about pills than other forms of treatment.  We will start clonidine TTS level II and recheck blood pressure in 2 weeks.  I indicated that patient needs to call if she thinks she is experiencing a side effect before discontinuing the drug.  Basic tests not yet performed will be obtained including a chest x-ray and urinalysis.  I will see her again in one month.  If we cannot successfully treat her pharmacologically, radiofrequency ablation of the renal artery should be available commercially in the near future.

## 2011-01-13 NOTE — Patient Instructions (Addendum)
Your physician recommends that you schedule a follow-up appointment in: 1 month with Dr Dietrich Pates and 2 weeks with nurse for blood pressure check  A chest x-ray takes a picture of the organs and structures inside the chest, including the heart, lungs, and blood vessels. This test can show several things, including, whether the heart is enlarges; whether fluid is building up in the lungs; and whether pacemaker / defibrillator leads are still in place.  Your physician has requested that you regularly monitor and record your blood pressure readings at home. Please use the same machine at the same time of day to check your readings and record them to bring to your follow-up visit.  Your physician has recommended you make the following change in your medication:  1 - Start Catapress patch and change weekly

## 2011-01-13 NOTE — Progress Notes (Signed)
Patient ID: Sara Haynes, female   DOB: 03-01-1945, 66 y.o.   MRN: [id number] HPI: Initial evaluation for this nice woman kindly referred by Dr. Lodema Hong for evaluation and treatment of hypertension.  Sara Haynes has had long-standing hypertension, but has never been able to achieve control due to adverse reactions to multiple medications.  Dr. Sudie Bailey initially attempted to manage, but apparently determined that treatment would be impossible.  Since starting with Dr. Lodema Hong, patient has had adverse reactions to amlodipine, diuretic and ACE inhibitor.  In all cases, she claims muscle cramps that resolved after the medication was discontinued.  She has no such symptoms when she is not taking medication.  Current Outpatient Prescriptions on File Prior to Visit  Medication Sig Dispense Refill  . aspirin (ASPIRIN LOW DOSE) 81 MG EC tablet Take 81 mg by mouth daily.        . Benzocaine-Antipyrine (AURALGAN) 1.4-5.5 % SOLN Place in ear(s). One drop 3 times daily to left ear for 5 days, then as needed for pain /discomfort       . calcium-vitamin D (OSCAL 500/200 D-3) 500-200 MG-UNIT per tablet Take 1 tablet by mouth 3 (three) times daily.        Marland Kitchen gatifloxacin (ZYMAXID) 0.5 % SOLN Place 1 drop into the left eye daily.        Marland Kitchen ketorolac (ACULAR) 0.4 % SOLN 1 drop daily.        . Multiple Vitamins-Minerals (WOMENS MULTIVITAMIN PLUS) TABS Take by mouth daily.        . mupirocin (BACTROBAN) 2 % ointment Apply topically. Apply to rash twice daily on  Ant chest wall for 5 days, then as needed for infection          Allergies  Allergen Reactions  . Amlodipine     All over cramps  . Hydrochlorothiazide W/Triamterene     REACTION: cramps      Past Medical History  Diagnosis Date  . Hypertension 1985    Never successfully treated.  . Hyperlipidemia 11/04/2010  . Abnormal electrocardiogram 02/03/2010    Sinus bradycardia  . Medication intolerance      Past Surgical History  Procedure Date  .  Tubal ligation 1977  . Cataract extraction 2011    Bilateral     Family History  Problem Relation Age of Onset  . COPD Mother   . Diabetes Mother   . Pneumonia Father   . Hypertension Sister   . Diabetes Brother   . Alcohol abuse Father      History   Social History  . Marital Status: Married    Spouse Name: N/A    Number of Children: 3  . Years of Education: N/A   Occupational History  . Retired    Social History Main Topics  . Smoking status: Never Smoker   . Smokeless tobacco: Never Used  . Alcohol Use: No  . Drug Use: No  . Sexually Active: Not on file   Other Topics Concern  . Not on file   Social History Narrative  . No narrative on file  ROS:  Dentures; stable weight and appetite; intermittent constipation.    All other systems reviewed and are negative.  PHYSICAL EXAM: BP 195/110  Pulse 96  Resp 16  Ht 5\' 6"  (1.676 m)  Wt 83.008 kg (183 lb)  BMI 29.54 kg/m2  General-Well-developed; no acute distress Body Habitus-proportionate weight and height HEENT-[place]/AT; PERRL; EOM intact; conjunctiva and lids nl Neck-No JVD; no carotid bruits  Endocrine-No thyromegaly Lungs-Clear lung fields; resonant percussion; normal I-to-E ratio Cardiovascular- normal PMI; normal S1 and S2 Abdomen-BS normal; soft and non-tender without masses or organomegaly Musculoskeletal-No deformities, cyanosis or clubbing Neurologic-Nl cranial nerves; symmetric strength and tone Skin- Warm, no significant lesions Extremities-Nl distal pulses; no edema  EKG:  Normal sinus rhythm, borderline left atrial abnormality, LVH by voltage, minor nonspecific ST segment abnormality, nondiagnostic lateral Q waves. When compared to a previous tracing performed 02/03/10, anterior T-wave inversions have resolved.  ASSESSMENT AND PLAN:  Brady Bing, MD 01/13/2011 1:06 PM

## 2011-01-13 NOTE — Assessment & Plan Note (Signed)
Lipid abnormalities are mild to moderate.  In the absence of known vascular disease, pharmacologic therapy is not necessary.

## 2011-01-19 LAB — URINALYSIS
Bilirubin Urine: NEGATIVE
Nitrite: NEGATIVE
Protein, ur: NEGATIVE mg/dL
Urobilinogen, UA: 0.2 mg/dL (ref 0.0–1.0)

## 2011-01-27 ENCOUNTER — Encounter: Payer: Self-pay | Admitting: *Deleted

## 2011-01-27 ENCOUNTER — Ambulatory Visit (INDEPENDENT_AMBULATORY_CARE_PROVIDER_SITE_OTHER): Payer: 59 | Admitting: *Deleted

## 2011-01-27 VITALS — BP 197/91 | HR 97 | Wt 183.0 lb

## 2011-01-27 DIAGNOSIS — I10 Essential (primary) hypertension: Secondary | ICD-10-CM

## 2011-01-27 NOTE — Patient Instructions (Signed)
Advised patient to continue all medications, as prescribed and follow up with Dr Dietrich Pates in Feb as planned.

## 2011-01-27 NOTE — Progress Notes (Signed)
Patient returns today for a 1 month BP check s/p initiation of Catapress patch.  Patient admits to not taking HCTZ 25 mg as prescribed, as she thought she was to stop it when patch initiated.  BP at home have all ben adequate with pressures in the 110 - 140 range, however was hypertensive in the office today.  States that she gets nervous when in the office.  No other complaints noted. Advised patient to continue all medications, as prescribed and follow up with Dr Dietrich Pates in Feb as planned.

## 2011-02-13 ENCOUNTER — Encounter: Payer: Self-pay | Admitting: Cardiology

## 2011-02-13 ENCOUNTER — Encounter: Payer: 59 | Admitting: Cardiology

## 2011-02-19 NOTE — Progress Notes (Signed)
This encounter was created in error - please disregard.

## 2011-02-28 ENCOUNTER — Ambulatory Visit: Payer: 59 | Admitting: Adult Health

## 2011-03-02 ENCOUNTER — Ambulatory Visit: Payer: 59 | Admitting: Adult Health

## 2011-03-03 ENCOUNTER — Ambulatory Visit (INDEPENDENT_AMBULATORY_CARE_PROVIDER_SITE_OTHER): Payer: 59 | Admitting: Adult Health

## 2011-03-03 ENCOUNTER — Encounter: Payer: Self-pay | Admitting: Adult Health

## 2011-03-03 DIAGNOSIS — I10 Essential (primary) hypertension: Secondary | ICD-10-CM

## 2011-03-03 MED ORDER — OLMESARTAN MEDOXOMIL 40 MG PO TABS: 40.0000 mg | ORAL_TABLET | Freq: Every day | ORAL | Status: DC

## 2011-03-03 NOTE — Assessment & Plan Note (Signed)
She may be experiencing [place]at syndrome but is clearly hypertensive on this visit. I have rechecked this manually and found it to correlate with her triage BP. I have convinced her to try benicar 40 mg samples for BP control. She is given 28 days of this and advised that she may feel lightheaded and dizzy on this medication at first, but this is normal. She is to come back for BP check next week having taken the medication. She will have a renal ultrasound ordered.

## 2011-03-03 NOTE — Patient Instructions (Signed)
Your physician recommends that you schedule a follow-up appointment in:  1 - 2 weeks with Sara Haynes 2 - 1 week for blood pressure check (Take medications prior to appointment)  Your physician has recommended you make the following change in your medication:  START Benicar 40 mg daily  Renal Ultrasound

## 2011-03-03 NOTE — Progress Notes (Signed)
Patient ID: Sara Haynes, female   DOB: 1945-01-27, 66 y.o.   MRN: [id number] HPI: Initial evaluation for this nice woman kindly referred by Dr. Lodema Hong for evaluation and treatment of hypertension.  Ms. Brahmbhatt has had long-standing hypertension, but has never been able to achieve control due to adverse reactions to multiple medications.  Dr. Sudie Bailey initially attempted to manage, but apparently determined that treatment would be impossible.  Since starting with Dr. Lodema Hong, patient has had adverse reactions to amlodipine, diuretic and ACE inhibitor.  In all cases, she claims muscle cramps that resolved after the medication was discontinued.  She has no such symptoms when she is not taking medication. She was placed on a clonidine TTS patch 0.2 mg weekly by Dr. Dietrich Pates on last visit. She comes today without having taken HCTZ as it causes her to have more cramping. She states that her BP is normal at home.  Current Outpatient Prescriptions on File Prior to Visit  Medication Sig Dispense Refill  . aspirin (ASPIRIN LOW DOSE) 81 MG EC tablet Take 81 mg by mouth daily.        . Benzocaine-Antipyrine (AURALGAN) 1.4-5.5 % SOLN Place in ear(s). One drop 3 times daily to left ear for 5 days, then as needed for pain /discomfort       . calcium-vitamin D (OSCAL 500/200 D-3) 500-200 MG-UNIT per tablet Take 1 tablet by mouth 3 (three) times daily.        . cloNIDine (CATAPRES - DOSED IN MG/24 HR) 0.2 mg/24hr patch Place 1 patch (0.2 mg total) onto the skin once a week.  4 patch  12  . hydrochlorothiazide (HYDRODIURIL) 25 MG tablet Take 1 tablet by mouth Daily.      . Multiple Vitamins-Minerals (WOMENS MULTIVITAMIN PLUS) TABS Take by mouth daily.        . potassium chloride SA (K-DUR,KLOR-CON) 20 MEQ tablet Take 1 tablet by mouth Twice daily.       Allergies  Allergen Reactions  . Amlodipine     All over cramps  . Hydrochlorothiazide W/Triamterene     REACTION: cramps      Past Medical History    Diagnosis Date  . Hypertension 1985    Never successfully treated.  . Hyperlipidemia 11/04/2010  . Abnormal electrocardiogram 02/03/2010    Sinus bradycardia  . Medication intolerance      Past Surgical History  Procedure Date  . Tubal ligation 1977  . Cataract extraction 2011    Bilateral  . Colonoscopy      Family History  Problem Relation Age of Onset  . COPD Mother   . Diabetes Mother   . Pneumonia Father   . Hypertension Sister   . Diabetes Brother   . Alcohol abuse Father      History   Social History  . Marital Status: Married    Spouse Name: N/A    Number of Children: 3  . Years of Education: N/A   Occupational History  . Retired    Social History Main Topics  . Smoking status: Never Smoker   . Smokeless tobacco: Never Used  . Alcohol Use: No  . Drug Use: No  . Sexually Active: Not on file   Other Topics Concern  . Not on file   Social History Narrative  . No narrative on file  ROS:  Dentures; stable weight and appetite; intermittent constipation.    All other systems reviewed and are negative.  PHYSICAL EXAM: BP 203/111  Pulse 99  Resp 18  Ht 5\' 6"  (1.676 m)  Wt 187 lb (84.823 kg)  BMI 30.18 kg/m2  General-Well-developed; no acute distress Body Habitus-proportionate weight and height HEENT-[place]/AT; PERRL; EOM intact; conjunctiva and lids nl Neck-No JVD; no carotid bruits Endocrine-No thyromegaly Lungs-Clear lung fields; resonant percussion; normal I-to-E ratio Cardiovascular- normal PMI; normal S1 and S2 Abdomen-BS normal; soft and non-tender without masses or organomegaly Musculoskeletal-No deformities, cyanosis or clubbing Neurologic-Nl cranial nerves; symmetric strength and tone Skin- Warm, no significant lesions Extremities-Nl distal pulses; no edema  EKG:  Normal sinus rhythm, borderline left atrial abnormality, LVH by voltage, minor nonspecific ST segment abnormality, nondiagnostic lateral Q waves. When compared to a previous tracing  performed 02/03/10, anterior T-wave inversions have resolved.  ASSESSMENT AND PLAN:  Joni Reining, NP 03/03/2011 4:15 PM

## 2011-03-06 ENCOUNTER — Ambulatory Visit (INDEPENDENT_AMBULATORY_CARE_PROVIDER_SITE_OTHER): Payer: 59

## 2011-03-06 ENCOUNTER — Other Ambulatory Visit: Payer: Self-pay | Admitting: Cardiology

## 2011-03-06 DIAGNOSIS — I10 Essential (primary) hypertension: Secondary | ICD-10-CM

## 2011-03-17 ENCOUNTER — Encounter: Payer: Self-pay | Admitting: Adult Health

## 2011-03-17 ENCOUNTER — Ambulatory Visit (INDEPENDENT_AMBULATORY_CARE_PROVIDER_SITE_OTHER): Payer: 59 | Admitting: Adult Health

## 2011-03-17 VITALS — BP 200/122 | HR 75 | Resp 16 | Ht 66.0 in | Wt 185.0 lb

## 2011-03-17 DIAGNOSIS — I10 Essential (primary) hypertension: Secondary | ICD-10-CM

## 2011-03-17 NOTE — Patient Instructions (Addendum)
Your physician recommends that you schedule a follow-up appointment in: 3 months.  

## 2011-03-17 NOTE — Progress Notes (Signed)
   HPI: Sara Haynes is a 66 y/o patient of Dr.Rothbart we are seeing for ongoing assessment of difficult to control hypertension. She was placed on clonidine TTS weekly patch one month ago and was still hypertensive. I added benicar 40 mg by mouth daily. She comes today stating she is feeling dizzy on these medications along with HCTZ she was taking. She stopped taking the HCTZ on her own,  She is adamant that her BP at home have been in the 130"s and 140's. I had a renal ultrasound completed to evaluate for RAS. She is here for discussion of results.  Allergies  Allergen Reactions  . Amlodipine     All over cramps  . Hydrochlorothiazide W/Triamterene     REACTION: cramps    Current Outpatient Prescriptions  Medication Sig Dispense Refill  . aspirin (ASPIRIN LOW DOSE) 81 MG EC tablet Take 81 mg by mouth daily.        . Benzocaine-Antipyrine (AURALGAN) 1.4-5.5 % SOLN Place in ear(s). One drop 3 times daily to left ear for 5 days, then as needed for pain /discomfort       . calcium-vitamin D (OSCAL 500/200 D-3) 500-200 MG-UNIT per tablet Take 1 tablet by mouth 3 (three) times daily.        . cloNIDine (CATAPRES - DOSED IN MG/24 HR) 0.2 mg/24hr patch Place 1 patch (0.2 mg total) onto the skin once a week.  4 patch  12  . Multiple Vitamins-Minerals (WOMENS MULTIVITAMIN PLUS) TABS Take by mouth daily.        Marland Kitchen olmesartan (BENICAR) 40 MG tablet Take 1 tablet (40 mg total) by mouth daily.  30 tablet  12    Past Medical History  Diagnosis Date  . Hypertension 1985    Never successfully treated.  . Hyperlipidemia 11/04/2010  . Abnormal electrocardiogram 02/03/2010    Sinus bradycardia  . Medication intolerance     Past Surgical History  Procedure Date  . Tubal ligation 1977  . Cataract extraction 2011    Bilateral  . Colonoscopy     EAV:WUJWJX of systems complete and found to be negative unless listed above PHYSICAL EXAM BP 200/122  Pulse 75  Resp 16  Ht 5\' 6"  (1.676 m)  Wt 185 lb  (83.915 kg)  BMI 29.86 kg/m2  General: Well developed, well nourished, in no acute distress Head: Eyes PERRLA, No xanthomas.   Normal cephalic and atramatic  Lungs: Clear bilaterally to auscultation and percussion. Heart: HRRR S1 S2, S4.  Pulses are 2+ & equal.            No carotid bruit. No JVD.  No abdominal bruits. No femoral bruits. Abdomen: Bowel sounds are positive, abdomen soft and non-tender without masses or                  Hernia's noted. Msk:  Back normal, normal gait. Normal strength and tone for age. Extremities: No clubbing, cyanosis or edema.  DP +1 Neuro: Alert and oriented X 3. Psych:  Good affect, responds appropriately    ASSESSMENT AND PLAN

## 2011-03-17 NOTE — Assessment & Plan Note (Signed)
She is still very hypertensive today. She refuses to take any more medications because of profound dizziness and pre-syncope. She takes BP at home and is finding results to be in the 130-140 systolic.  Renal ultrasound completed this month was normal and negative for stenosis.  White coat syndrome is probable cause of her elevated BP.  She is recommended to continue with Dr.Simpson for possible antianxiety when she has medical appointments and prescription for low dose valium or xananx.  . She refuses any more medication titration at this time or further testing. She will see Dr. Dietrich Pates in 3 months.  I told her that I was concerned about her white coat syndrome because for all intent and purposes, it is still hypertension. Depending on her stress level, she may have episodes of this throughout the day.  I have warned her about this. She is to continue to take current medications as directed, even though she may feel a little lightheaded on them initially. She verbalizes understanding.

## 2011-03-21 ENCOUNTER — Ambulatory Visit: Payer: Medicare PPO | Admitting: Cardiology

## 2011-04-06 ENCOUNTER — Encounter: Payer: Self-pay | Admitting: Family Medicine

## 2011-04-06 ENCOUNTER — Ambulatory Visit: Payer: 59 | Admitting: Family Medicine

## 2011-06-19 ENCOUNTER — Encounter: Payer: Self-pay | Admitting: Cardiology

## 2011-06-19 ENCOUNTER — Ambulatory Visit (INDEPENDENT_AMBULATORY_CARE_PROVIDER_SITE_OTHER): Payer: 59 | Admitting: Cardiology

## 2011-06-19 VITALS — BP 222/126 | HR 90 | Resp 16 | Ht 66.0 in | Wt 187.0 lb

## 2011-06-19 DIAGNOSIS — E785 Hyperlipidemia, unspecified: Secondary | ICD-10-CM

## 2011-06-19 DIAGNOSIS — I10 Essential (primary) hypertension: Secondary | ICD-10-CM

## 2011-06-19 DIAGNOSIS — R9431 Abnormal electrocardiogram [ECG] [EKG]: Secondary | ICD-10-CM

## 2011-06-19 NOTE — Progress Notes (Deleted)
Name: Sara Haynes    DOB: [birth year]  Age: 66 y.o.  MR#: [id number]       PCP:  Syliva Overman, MD      Insurance: @PAYORNAME @   CC:    Chief Complaint  Patient presents with  . Appointment    no complaints +meds    VS BP 222/126  Pulse 90  Resp 16  Ht 5\' 6"  (1.676 m)  Wt 187 lb (84.823 kg)  BMI 30.18 kg/m2  Weights Current Weight  06/19/11 187 lb (84.823 kg)  03/17/11 185 lb (83.915 kg)  03/03/11 187 lb (84.823 kg)    Blood Pressure  BP Readings from Last 3 Encounters:  06/19/11 222/126  03/17/11 200/122  03/03/11 203/111     Admit date:  (Not on file) Last encounter with RMR:  02/13/2011   Allergy Allergies  Allergen Reactions  . Amlodipine     All over cramps  . Hydrochlorothiazide W-Triamterene     REACTION: cramps    Current Outpatient Prescriptions  Medication Sig Dispense Refill  . aspirin (ASPIRIN LOW DOSE) 81 MG EC tablet Take 81 mg by mouth daily.        . calcium-vitamin D (OSCAL 500/200 D-3) 500-200 MG-UNIT per tablet Take 1 tablet by mouth 3 (three) times daily.        . cloNIDine (CATAPRES - DOSED IN MG/24 HR) 0.2 mg/24hr patch Place 1 patch (0.2 mg total) onto the skin once a week.  4 patch  12  . Multiple Vitamins-Minerals (WOMENS MULTIVITAMIN PLUS) TABS Take by mouth daily.        Marland Kitchen olmesartan (BENICAR) 40 MG tablet Take 40 mg by mouth as needed.      Marland Kitchen DISCONTD: olmesartan (BENICAR) 40 MG tablet Take 1 tablet (40 mg total) by mouth daily.  30 tablet  12  . DISCONTD: hydrochlorothiazide (HYDRODIURIL) 25 MG tablet Take 1 tablet by mouth Daily.      Marland Kitchen DISCONTD: potassium chloride SA (K-DUR,KLOR-CON) 20 MEQ tablet Take 1 tablet by mouth Twice daily.        Discontinued Meds:    Medications Discontinued During This Encounter  Medication Reason  . Benzocaine-Antipyrine (AURALGAN) 1.4-5.5 % SOLN Error  . olmesartan (BENICAR) 40 MG tablet     Patient Active Problem List  Diagnosis  . HYPERTENSION  . ABNORMAL ELECTROCARDIOGRAM  .  Hyperlipidemia  . Prediabetes  . Medication intolerance    LABS No visits with results within 3 Month(s) from this visit. Latest known visit with results is:  Office Visit on 01/13/2011  Component Date Value  . Color, Urine 01/18/2011 YELLOW   . APPearance 01/18/2011 CLEAR   . Specific Gravity, Urine 01/18/2011 1.013   . pH 01/18/2011 5.0   . Glucose, UA 01/18/2011 NEG   . Bilirubin Urine 01/18/2011 NEG   . Ketones, ur 01/18/2011 NEG   . Hgb urine dipstick 01/18/2011 TRACE*  . Protein, ur 01/18/2011 NEG   . Urobilinogen, UA 01/18/2011 0.2   . Nitrite 01/18/2011 NEG   . Leukocytes, UA 01/18/2011 NEG      Results for this Opt Visit:     Results for orders placed in visit on 01/13/11  URINALYSIS      Component Value Range   Color, Urine YELLOW  YELLOW   APPearance CLEAR  CLEAR   Specific Gravity, Urine 1.013  1.005 - 1.030   pH 5.0  5.0 - 8.0   Glucose, UA NEG  NEG mg/dL   Bilirubin Urine NEG  NEG   Ketones, ur NEG  NEG mg/dL   Hgb urine dipstick TRACE (*) NEG   Protein, ur NEG  NEG mg/dL   Urobilinogen, UA 0.2  0.0 - 1.0 mg/dL   Nitrite NEG  NEG   Leukocytes, UA NEG  NEG    EKG Orders placed in visit on 01/13/11  . EKG 12-LEAD     Prior Assessment and Plan Problem List as of 06/19/2011            Cardiology Problems   HYPERTENSION   Last Assessment & Plan Note   03/17/2011 Office Visit Signed 03/17/2011  2:18 PM by Jodelle Gross, NP    She is still very hypertensive today. She refuses to take any more medications because of profound dizziness and pre-syncope. She takes BP at home and is finding results to be in the 130-140 systolic.  Renal ultrasound completed this month was normal and negative for stenosis.  White coat syndrome is probable cause of her elevated BP.  She is recommended to continue with Dr.Simpson for possible antianxiety when she has medical appointments and prescription for low dose valium or xananx.  . She refuses any more medication  titration at this time or further testing. She will see Dr. Dietrich Pates in 3 months.  I told her that I was concerned about her white coat syndrome because for all intent and purposes, it is still hypertension. Depending on her stress level, she may have episodes of this throughout the day.  I have warned her about this. She is to continue to take current medications as directed, even though she may feel a little lightheaded on them initially. She verbalizes understanding.    Hyperlipidemia   Last Assessment & Plan Note   01/13/2011 Office Visit Signed 01/13/2011  1:14 PM by Kathlen Brunswick, MD    Lipid abnormalities are mild to moderate.  In the absence of known vascular disease, pharmacologic therapy is not necessary.      Other   ABNORMAL ELECTROCARDIOGRAM   Last Assessment & Plan Note   01/13/2011 Office Visit Signed 01/13/2011  1:12 PM by Kathlen Brunswick, MD    EKG shows changes suggestive of LVH, but is otherwise unremarkable.  No further testing nor treatment is warranted beyond control of hypertension.    Prediabetes   Last Assessment & Plan Note   01/06/2011 Office Visit Signed 01/07/2011  6:19 AM by Kerri Perches, MD    Pos fam h/o diabetes, with abn hBA1C. Encouraged to attend group sessions on diabetic ed and diet, the importance of measuring carbs and limiting sweets is discussed    Medication intolerance       Imaging: No results found.   FRS Calculation: Score not calculated. Missing: Total Cholesterol

## 2011-06-19 NOTE — Assessment & Plan Note (Signed)
Blood pressure remains markedly elevated in a medical setting.  24-hour ambulatory BP monitoring will be arranged.  Patient will return with a list of determination she has already obtained at home.  Chillness certainly requires additional medication, but finding drugs she will take will likely be difficult.

## 2011-06-19 NOTE — Progress Notes (Signed)
Patient ID: Sara Haynes, female   DOB: 1945/06/07, 66 y.o.   MRN: [id number]  HPI: Scheduled return visit for this nice woman with hypertension that has been difficult to control due to exacerbation in the medical setting and adverse effects to multiple antihypertensive medications.  Since her last visit, she has discontinued Benicar.  She found that blood pressure actually increased when she took that medication resulting in dizziness.  She has never had low blood pressure in response to any medication.  She is feeling well with good exercise tolerance and good quality of life.  Prior to Admission medications   Medication Sig Start Date End Date Taking? Authorizing Provider  aspirin (ASPIRIN LOW DOSE) 81 MG EC tablet Take 81 mg by mouth daily.     Yes Historical Provider, MD  calcium-vitamin D (OSCAL 500/200 D-3) 500-200 MG-UNIT per tablet Take 1 tablet by mouth 3 (three) times daily.     Yes Historical Provider, MD  cloNIDine (CATAPRES - DOSED IN MG/24 HR) 0.2 mg/24hr patch Place 1 patch (0.2 mg total) onto the skin once a week. 01/13/11 01/13/12 Yes Kathlen Brunswick, MD  Multiple Vitamins-Minerals (WOMENS MULTIVITAMIN PLUS) TABS Take by mouth daily.     Yes Historical Provider, MD  olmesartan (BENICAR) 40 MG tablet Take 40 mg by mouth as needed. 03/03/11 03/02/12  Jodelle Gross, NP   Allergies  Allergen Reactions  . Amlodipine     All over cramps  . Hydrochlorothiazide W-Triamterene     REACTION: cramps     Past medical history, social history, and family history reviewed and updated.  ROS: Denies orthopnea, PND, pedal edema, palpitations or syncope.  All other systems reviewed and are negative.  PHYSICAL EXAM: BP 222/126  Pulse 90  Resp 16  Ht 5\' 6"  (1.676 m)  Wt 84.823 kg (187 lb)  BMI 30.18 kg/m2  General-Well developed; no acute distress Funduscopic-Grade 1-2 hypertensive changes. Body habitus-Mildly overweight Neck-No JVD; no carotid bruits Lungs-clear lung fields;  resonant to percussion Cardiovascular-normal PMI; normal S1 and S2 Abdomen-normal bowel sounds; soft and non-tender without masses or organomegaly Musculoskeletal-No deformities, no cyanosis or clubbing Neurologic-Normal cranial nerves; symmetric strength and tone Skin-Warm, no significant lesions Extremities-distal pulses intact; no edema  ASSESSMENT AND PLAN:  [place] Bing, MD 06/19/2011 1:12 PM

## 2011-06-19 NOTE — Assessment & Plan Note (Signed)
EKG suggests LVH.  Severity of process will be evaluated with echocardiography.

## 2011-06-19 NOTE — Patient Instructions (Signed)
Your physician recommends that you schedule a follow-up appointment in: 1 -  2 months with provider 2 - This week with nurses and bring blood pressure cuff and all medication bottles  Your physician has requested that you have an echocardiogram. Echocardiography is a painless test that uses sound waves to create images of your heart. It provides your doctor with information about the size and shape of your heart and how well your heart's chambers and valves are working. This procedure takes approximately one hour. There are no restrictions for this procedure.

## 2011-06-20 NOTE — Assessment & Plan Note (Signed)
Lipid values are suboptimal, but pharmacologic treatment is not mandated in the absence of known vascular disease.

## 2011-06-21 ENCOUNTER — Ambulatory Visit (HOSPITAL_COMMUNITY)
Admission: RE | Admit: 2011-06-21 | Discharge: 2011-06-21 | Disposition: A | Discharge status: Home / Self Care | Payer: Medicare PPO | Source: Ambulatory Visit | Attending: Cardiology | Admitting: Cardiology

## 2011-06-21 ENCOUNTER — Ambulatory Visit (INDEPENDENT_AMBULATORY_CARE_PROVIDER_SITE_OTHER): Payer: Medicare PPO | Admitting: *Deleted

## 2011-06-21 VITALS — BP 195/100 | HR 81 | Ht 66.0 in | Wt 186.0 lb

## 2011-06-21 DIAGNOSIS — I059 Rheumatic mitral valve disease, unspecified: Secondary | ICD-10-CM | POA: Insufficient documentation

## 2011-06-21 DIAGNOSIS — I359 Nonrheumatic aortic valve disorder, unspecified: Secondary | ICD-10-CM | POA: Insufficient documentation

## 2011-06-21 DIAGNOSIS — E785 Hyperlipidemia, unspecified: Secondary | ICD-10-CM | POA: Insufficient documentation

## 2011-06-21 DIAGNOSIS — I10 Essential (primary) hypertension: Secondary | ICD-10-CM

## 2011-06-21 DIAGNOSIS — R9431 Abnormal electrocardiogram [ECG] [EKG]: Secondary | ICD-10-CM | POA: Insufficient documentation

## 2011-06-21 NOTE — Progress Notes (Signed)
*  PRELIMINARY RESULTS* Echocardiogram 2D Echocardiogram has been performed.  Caswell Corwin 06/21/2011, 9:10 AM

## 2011-06-21 NOTE — Progress Notes (Signed)
Presents for blood pressure check and assessment of home blood pressure monitor.  Her monitor reads 228/121 today, which is off from our monitor.  Brings multiple readings from home, which are much better than today's readings.  Denies complaints.  Medication list reviewed.  Pt is awaiting a 24 hour blood pressure monitor at this time.   Cardiology Attending Blood pressure monitor is apparently an accurate and likely needs to be replaced. Home blood pressure determinations not available for my review.     Obtain 24 hour monitoring device from noninvasive lab and provide to patient.  I will assist in setting up device.  [place] Bing, MD 07/16/2011, 9:42 AM

## 2011-06-22 ENCOUNTER — Encounter: Payer: Self-pay | Admitting: *Deleted

## 2011-07-04 ENCOUNTER — Other Ambulatory Visit: Payer: Self-pay | Admitting: Family Medicine

## 2011-07-04 ENCOUNTER — Ambulatory Visit (INDEPENDENT_AMBULATORY_CARE_PROVIDER_SITE_OTHER): Payer: Medicare PPO | Admitting: Family Medicine

## 2011-07-04 ENCOUNTER — Encounter: Payer: Self-pay | Admitting: Family Medicine

## 2011-07-04 VITALS — BP 212/112 | HR 87 | Resp 18 | Ht 66.5 in | Wt 186.0 lb

## 2011-07-04 DIAGNOSIS — Z1211 Encounter for screening for malignant neoplasm of colon: Secondary | ICD-10-CM

## 2011-07-04 DIAGNOSIS — Z139 Encounter for screening, unspecified: Secondary | ICD-10-CM

## 2011-07-04 DIAGNOSIS — R7309 Other abnormal glucose: Secondary | ICD-10-CM

## 2011-07-04 DIAGNOSIS — R7303 Prediabetes: Secondary | ICD-10-CM

## 2011-07-04 DIAGNOSIS — I10 Essential (primary) hypertension: Secondary | ICD-10-CM

## 2011-07-04 DIAGNOSIS — E785 Hyperlipidemia, unspecified: Secondary | ICD-10-CM

## 2011-07-04 LAB — CBC
MCV: 86 fL (ref 78.0–100.0)
Platelets: 200 10*3/uL (ref 150–400)
RBC: 3.58 MIL/uL — ABNORMAL LOW (ref 3.87–5.11)
RDW: 14.2 % (ref 11.5–15.5)
WBC: 5.3 10*3/uL (ref 4.0–10.5)

## 2011-07-04 LAB — POC HEMOCCULT BLD/STL (OFFICE/1-CARD/DIAGNOSTIC): Fecal Occult Blood, POC: NEGATIVE

## 2011-07-04 NOTE — Progress Notes (Signed)
  Subjective:    Patient ID: Sara Haynes, female    DOB: [birth year], 66 y.o.   MRN: [id number]  HPI Last weekend pt experienced cramping lower abdominal pain, excessive flatulence , no vomiting, feels nauseated all the time. Notes a change in stool caliber , and a feeling of incomplete emptying, seeing pellets for 4 to 6 month, colonoscopy past due , states she had her last in approx 2003 in office of her former PCP Dr Sudie Bailey. She has a brother with colon polyp. Still remains intolerant of blood pressure med and is uncontrolled. I have encouraged her to continue the clonidine patch and start benicar 40mg  at 10pm every night, she agrees to do this Was prediabetic and hyperlipidemic , labs are past due   Review of Systems See HPI Denies recent fever or chills. Denies sinus pressure, nasal congestion, ear pain or sore throat. Denies chest congestion, productive cough or wheezing. Denies chest pains, palpitations and leg swelling   Denies dysuria, frequency, hesitancy or incontinence. Denies joint pain, swelling and limitation in mobility. Denies headaches, seizures, numbness, or tingling. Denies depression, anxiety or insomnia. Denies skin break down or rash.        Objective:   Physical Exam Patient alert and oriented and in no cardiopulmonary distress.  HEENT: No facial asymmetry, EOMI, no sinus tenderness,  oropharynx pink and moist.  Neck supple no adenopathy.  Chest: Clear to auscultation bilaterally.  CVS: S1, S2 no murmurs, no S3.  ABD: Soft non tender. Bowel sounds normal.No organomegaly or masses Rectal: no mass, guaiac negative stool Ext: No edema  MS: Adequate ROM spine, shoulders, hips and knees.  Skin: Intact, no ulcerations or rash noted.  Psych: Good eye contact, normal affect. Memory intact not anxious or depressed appearing.  CNS: CN 2-12 intact, power, tone and sensation normal throughout.        Assessment & Plan:

## 2011-07-04 NOTE — Assessment & Plan Note (Signed)
Uncontrolled, pt encouraged to take benicar every evening on schedule

## 2011-07-04 NOTE — Assessment & Plan Note (Signed)
Hyperlipidemia:Low fat diet discussed and encouraged.  Updated lab today 

## 2011-07-04 NOTE — Patient Instructions (Addendum)
F/u in 3 to 3.5  weeks.  You are being scheduled for your mammogram it is past due.  You are referred to Dr Karilyn Cota for screening colonoscopy, you need this.  PLEASE START Benicar 40mg  at 10 o clock every night , continue the weekly clonidine patch, your blood pressure is too high   Please stop chips , sweets and salty snacks.  Eat fresh fruit and vegetable and drink water, also white meat or fish, grilled or baked, not fried  Labs today CBc, chem 7, hba1c, tsh, lipid, h pylori  Rectal today

## 2011-07-04 NOTE — Assessment & Plan Note (Signed)
Low carb diet, reduced sugar intake and weight loss stressed

## 2011-07-05 LAB — LIPID PANEL
Cholesterol: 211 mg/dL — ABNORMAL HIGH (ref 0–200)
Total CHOL/HDL Ratio: 4.5 Ratio
Triglycerides: 121 mg/dL (ref <150)

## 2011-07-05 LAB — BASIC METABOLIC PANEL
CO2: 30 mEq/L (ref 19–32)
Chloride: 104 mEq/L (ref 96–112)
Creat: 0.88 mg/dL (ref 0.50–1.10)
Sodium: 141 mEq/L (ref 135–145)

## 2011-07-05 LAB — HEMOGLOBIN A1C: Mean Plasma Glucose: 120 mg/dL — ABNORMAL HIGH (ref <117)

## 2011-07-05 LAB — H. PYLORI ANTIBODY, IGG: H Pylori IgG: 0.43 {ISR}

## 2011-07-09 ENCOUNTER — Encounter: Payer: Self-pay | Admitting: Cardiology

## 2011-07-10 ENCOUNTER — Ambulatory Visit (HOSPITAL_COMMUNITY): Payer: Medicare PPO

## 2011-07-11 ENCOUNTER — Other Ambulatory Visit (INDEPENDENT_AMBULATORY_CARE_PROVIDER_SITE_OTHER): Payer: Self-pay | Admitting: *Deleted

## 2011-07-11 ENCOUNTER — Telehealth (INDEPENDENT_AMBULATORY_CARE_PROVIDER_SITE_OTHER): Payer: Self-pay | Admitting: *Deleted

## 2011-07-11 ENCOUNTER — Encounter (INDEPENDENT_AMBULATORY_CARE_PROVIDER_SITE_OTHER): Payer: Self-pay | Admitting: *Deleted

## 2011-07-11 DIAGNOSIS — Z1211 Encounter for screening for malignant neoplasm of colon: Secondary | ICD-10-CM

## 2011-07-11 MED ORDER — PEG-KCL-NACL-NASULF-NA ASC-C 100 G PO SOLR: 1.0000 | Freq: Once | ORAL | Status: DC

## 2011-07-11 NOTE — Telephone Encounter (Signed)
Patient needs movi prep 

## 2011-07-12 ENCOUNTER — Encounter: Payer: Self-pay | Admitting: Cardiology

## 2011-07-13 ENCOUNTER — Ambulatory Visit (HOSPITAL_COMMUNITY)
Admission: RE | Admit: 2011-07-13 | Discharge: 2011-07-13 | Disposition: A | Discharge status: Home / Self Care | Payer: Medicare PPO | Source: Ambulatory Visit | Attending: Family Medicine | Admitting: Family Medicine

## 2011-07-13 DIAGNOSIS — Z139 Encounter for screening, unspecified: Secondary | ICD-10-CM

## 2011-07-13 DIAGNOSIS — Z1231 Encounter for screening mammogram for malignant neoplasm of breast: Secondary | ICD-10-CM | POA: Insufficient documentation

## 2011-08-01 ENCOUNTER — Ambulatory Visit: Payer: Medicare PPO | Admitting: Family Medicine

## 2011-08-02 ENCOUNTER — Encounter (INDEPENDENT_AMBULATORY_CARE_PROVIDER_SITE_OTHER): Payer: Self-pay | Admitting: *Deleted

## 2011-08-03 ENCOUNTER — Ambulatory Visit (INDEPENDENT_AMBULATORY_CARE_PROVIDER_SITE_OTHER): Payer: Medicare PPO | Admitting: Family Medicine

## 2011-08-03 ENCOUNTER — Encounter: Payer: Self-pay | Admitting: Family Medicine

## 2011-08-03 VITALS — BP 220/120 | HR 77 | Resp 18 | Ht 66.5 in | Wt 187.1 lb

## 2011-08-03 DIAGNOSIS — I10 Essential (primary) hypertension: Secondary | ICD-10-CM

## 2011-08-03 DIAGNOSIS — Z789 Other specified health status: Secondary | ICD-10-CM

## 2011-08-03 NOTE — Patient Instructions (Signed)
Continue the clonidine You should be able to pick up your monitor on Monday  Keep previous appointment with heart doctor  F/U 2 months Dr. Lodema Hong

## 2011-08-03 NOTE — Progress Notes (Signed)
  Subjective:    Patient ID: Sara Haynes, female    DOB: 11-05-1945, 66 y.o.   MRN: [id number]  HPI Patient presents to followup interim visit for hypertension. She was restarted on Benicar 40 mg at bedtime, was unable to tolerate that she had dizzy spells and felt swimmy headed. She stopped this medication one week ago. She continues to wear the clonidine patch. She states her blood pressure is always elevated when she comes into the doctor's office however at home is very well controlled her blood pressure this morning was 135/81. She denies chest pain, shortness of breath, headache, current dizziness. She's been evaluated by cardiology in his be set up with cardiac monitor however this will not be available until Monday   Review of Systems - per above    GEN- denies fatigue, fever, weight loss,weakness, recent illness HEENT- denies eye drainage, change in vision, nasal discharge, CVS- denies chest pain, palpitations RESP- denies SOB, cough, wheeze ABD- denies N/V, change in stools, abd pain Neuro- denies headache, +dizziness, syncope, seizure activity      Objective:   Physical Exam GEN- NAD, alert and oriented x3 HEENT- PERRL, EOMI, non injected sclera, pink conjunctiva, MMM, oropharynx clear CVS- RRR, no murmur RESP-CTAB EXT- No edema Pulses- Radial, DP- 2+ Neuro- CNII-XII in tact, no focal deficits        Assessment & Plan:

## 2011-08-03 NOTE — Assessment & Plan Note (Signed)
Intolerant of multiple medications with effects of dizziness, HA and muscle cramps with anti-hypertensives

## 2011-08-03 NOTE — Assessment & Plan Note (Addendum)
Intolerant of multiple medications, has tried diuretic, maxide, norvasc, ACEI, ARB. Tolerating clonidine. Will f/u with cardiology for further recommendations. She is set up to wear monitor for BP monitoring starting Monday hopefully to determine if this is a " white coat hypertension". Discussed with PCP, I will not start any new medication today, will await recommendation from pt cardiologist  Reviewed labs with patient  I discussed with cardiology, with intolerance no change to medication, await monitor and his f/u appt in August

## 2011-08-07 ENCOUNTER — Other Ambulatory Visit: Payer: Self-pay | Admitting: *Deleted

## 2011-08-07 DIAGNOSIS — I10 Essential (primary) hypertension: Secondary | ICD-10-CM

## 2011-08-17 ENCOUNTER — Telehealth: Payer: Self-pay | Admitting: Cardiology

## 2011-08-17 NOTE — Telephone Encounter (Signed)
Please schedule a 24 hour BP monitor next week.  Patient states it is ordered by physician.

## 2011-08-21 ENCOUNTER — Ambulatory Visit: Payer: 59 | Admitting: Cardiology

## 2011-08-21 ENCOUNTER — Telehealth: Payer: Self-pay | Admitting: Cardiology

## 2011-08-21 NOTE — Telephone Encounter (Signed)
Please see note below. 

## 2011-08-21 NOTE — Telephone Encounter (Signed)
Patient would like to know how the 24 hr BP monitor works. / tg

## 2011-08-22 ENCOUNTER — Ambulatory Visit (HOSPITAL_COMMUNITY): Admission: RE | Admit: 2011-08-22 | Payer: Medicare PPO | Source: Ambulatory Visit

## 2011-08-22 NOTE — Telephone Encounter (Signed)
Patient states she did not receive return call yesterday and that she does not want the 24 hr BP monitor/tg

## 2011-08-22 NOTE — Telephone Encounter (Signed)
Advised patient of mechanism of action related to the 24 hour blood pressure monitor and rationale.  States BP has been good at home and she does not wish to pursue this at this time.  I asked that she bring [place] a list of pressures on Friday and I will forward that information to Dr Dietrich Pates for review.

## 2011-08-23 NOTE — Telephone Encounter (Signed)
Agreed -

## 2011-08-30 ENCOUNTER — Telehealth (INDEPENDENT_AMBULATORY_CARE_PROVIDER_SITE_OTHER): Payer: Self-pay | Admitting: *Deleted

## 2011-08-30 NOTE — Telephone Encounter (Signed)
PCP/Requesting MD: simpson  Name & DOB: Sara Haynes [birth year]     Procedure: tcs  Reason/Indication:  screening  Has patient had this procedure before?  no  If so, when, by whom and where?    Is there a family history of colon cancer?  no  Who?  What age when diagnosed?    Is patient diabetic?   no      Does patient have prosthetic heart valve?  no  Do you have a pacemaker?  no  Has patient had joint replacement within last 12 months?  no  Is patient on Coumadin, Plavix and/or Aspirin? yes  Medications: asa 81 mg daily, benicar 40 mg daily, clonidine 0.2 mg weekly,advil prn, iron daily  Allergies: nkda  Medication Adjustment: asa 2 days, iron 10 days  Procedure date & time: 09/20/11 at 930

## 2011-09-01 NOTE — Telephone Encounter (Signed)
agree

## 2011-09-07 ENCOUNTER — Ambulatory Visit (INDEPENDENT_AMBULATORY_CARE_PROVIDER_SITE_OTHER): Payer: Medicare PPO | Admitting: Cardiology

## 2011-09-07 ENCOUNTER — Encounter: Payer: Self-pay | Admitting: *Deleted

## 2011-09-07 ENCOUNTER — Encounter (HOSPITAL_COMMUNITY): Payer: Self-pay | Admitting: Pharmacy Technician

## 2011-09-07 ENCOUNTER — Encounter: Payer: Self-pay | Admitting: Cardiology

## 2011-09-07 VITALS — BP 220/118 | HR 91 | Ht 66.0 in | Wt 190.0 lb

## 2011-09-07 DIAGNOSIS — E785 Hyperlipidemia, unspecified: Secondary | ICD-10-CM

## 2011-09-07 DIAGNOSIS — I10 Essential (primary) hypertension: Secondary | ICD-10-CM

## 2011-09-07 MED ORDER — LISINOPRIL 5 MG PO TABS: 5.0000 mg | ORAL_TABLET | Freq: Every day | ORAL | Status: DC

## 2011-09-07 NOTE — Progress Notes (Signed)
Patient ID: Sara Haynes, female   DOB: 1945/04/02, 66 y.o.   MRN: [id number]  HPI: Scheduled return visit for this very nice woman with hypertension.  Since her last visit, she has done fairly well.  She reports some adverse mental effects with transdermal clonidine, but finds these tolerable.  She has maintained careful log of blood pressures at home.  Systolics typically varied from 140-160 and diastolics from 80-95.  Adverse reactions to medications were reviewed with her.  She has had difficulty with amlodipine, hydrochlorothiazide and Benicar, but can recall no other drug intolerances.  Prior to Admission medications   Medication Sig Start Date End Date Taking? Authorizing Provider  aspirin (ASPIRIN LOW DOSE) 81 MG EC tablet Take 81 mg by mouth daily.     Yes Historical Provider, MD  calcium-vitamin D (OSCAL 500/200 D-3) 500-200 MG-UNIT per tablet Take 1 tablet by mouth 3 (three) times daily.     Yes Historical Provider, MD  CHROMIUM PICOLINATE PO Take 1 capsule by mouth daily.   Yes Historical Provider, MD  cloNIDine (CATAPRES - DOSED IN MG/24 HR) 0.2 mg/24hr patch Place 1 patch (0.2 mg total) onto the skin once a week. 01/13/11 01/13/12 Yes Kathlen Brunswick, MD  ibuprofen (ADVIL,MOTRIN) 200 MG tablet Take 200 mg by mouth every 6 (six) hours as needed. For pain   Yes Historical Provider, MD  Multiple Vitamins-Minerals (WOMENS MULTIVITAMIN PLUS) TABS Take by mouth daily.     Yes Historical Provider, MD  olmesartan (BENICAR) 40 MG tablet Take 40 mg by mouth daily.    Historical Provider, MD   Allergies  Allergen Reactions  . Amlodipine     All over cramps  . Hydrochlorothiazide W-Triamterene     REACTION: cramps     Past medical history, social history, and family history reviewed and updated.  ROS: Denies orthopnea, PND, exertional dyspnea, chest discomfort, palpitations or syncope.  All other systems reviewed and are negative.  PHYSICAL EXAM: BP 220/118  Pulse 91  Ht 5\' 6"   (1.676 m)  Wt 86.183 kg (190 lb)  BMI 30.67 kg/m2  SpO2 96%  General-Well developed; no acute distress Body habitus-overweight Neck-No JVD; no carotid bruits Lungs-clear lung fields; resonant to percussion Cardiovascular-normal PMI; normal S1 and S2 Abdomen-normal bowel sounds; soft and non-tender without masses or organomegaly Musculoskeletal-No deformities, no cyanosis or clubbing Neurologic-Normal cranial nerves; symmetric strength and tone Skin-Warm, no significant lesions Extremities-distal pulses intact; no edema  ASSESSMENT AND PLAN:  [place] Bing, MD 09/07/2011 2:01 PM

## 2011-09-07 NOTE — Assessment & Plan Note (Signed)
Lipid profile is suboptimal, and patient has multiple cardiovascular risk factors but no known vascular disease.  Pharmacologic therapy is not necessarily warranted.

## 2011-09-07 NOTE — Patient Instructions (Addendum)
Your physician recommends that you schedule a follow-up appointment in:  1 - 1 month for blood pressure check 2 - 2 months with Dr Dietrich Pates  Your physician has recommended you make the following change in your medication:  1 - START Lisinopril 5 mg daily  Your physician has requested that you regularly monitor and record your blood pressure readings at home. Please use the same machine at the same time of day to check your readings and record them to bring to your nurse visit, along with your blood pressure machine.  Your physician recommends that you return for lab work in: 3 weeks

## 2011-09-07 NOTE — Progress Notes (Deleted)
Name: Sara Haynes    DOB: 1945/10/18  Age: 66 y.o.  MR#: [id number]       PCP:  Syliva Overman, MD      Insurance: @PAYORNAME @   CC:    Chief Complaint  Patient presents with  . Hypertension    Stopped Benicar after a few doses, as it caused "dizziness" - brings a list of adequate blood pressures, taken with an automatic cuff.    VS BP 220/118  Pulse 91  Ht 5\' 6"  (1.676 m)  Wt 190 lb (86.183 kg)  BMI 30.67 kg/m2  SpO2 96%  Weights Current Weight  09/07/11 190 lb (86.183 kg)  08/03/11 187 lb 1.9 oz (84.877 kg)  07/04/11 186 lb (84.369 kg)    Blood Pressure  BP Readings from Last 3 Encounters:  09/07/11 220/118  08/03/11 220/120  07/04/11 212/112     Admit date:  (Not on file) Last encounter with RMR:  08/21/2011   Allergy Allergies  Allergen Reactions  . Amlodipine     All over cramps  . Hydrochlorothiazide W-Triamterene     REACTION: cramps    Current Outpatient Prescriptions  Medication Sig Dispense Refill  . aspirin (ASPIRIN LOW DOSE) 81 MG EC tablet Take 81 mg by mouth daily.        . calcium-vitamin D (OSCAL 500/200 D-3) 500-200 MG-UNIT per tablet Take 1 tablet by mouth 3 (three) times daily.        . CHROMIUM PICOLINATE PO Take 1 capsule by mouth daily.      . cloNIDine (CATAPRES - DOSED IN MG/24 HR) 0.2 mg/24hr patch Place 1 patch (0.2 mg total) onto the skin once a week.  4 patch  12  . ibuprofen (ADVIL,MOTRIN) 200 MG tablet Take 200 mg by mouth every 6 (six) hours as needed. For pain      . Multiple Vitamins-Minerals (WOMENS MULTIVITAMIN PLUS) TABS Take by mouth daily.        Marland Kitchen olmesartan (BENICAR) 40 MG tablet Take 40 mg by mouth daily.      Marland Kitchen DISCONTD: hydrochlorothiazide (HYDRODIURIL) 25 MG tablet Take 1 tablet by mouth Daily.      Marland Kitchen DISCONTD: potassium chloride SA (K-DUR,KLOR-CON) 20 MEQ tablet Take 1 tablet by mouth Twice daily.        Discontinued Meds:   There are no discontinued medications.  Patient Active Problem List  Diagnosis  .  Hypertension  . ABNORMAL ELECTROCARDIOGRAM  . Hyperlipidemia  . Prediabetes  . Medication intolerance    LABS Office Visit on 07/04/2011  Component Date Value  . WBC 07/04/2011 5.3   . RBC 07/04/2011 3.58*  . Hemoglobin 07/04/2011 10.4*  . HCT 07/04/2011 30.8*  . MCV 07/04/2011 86.0   . [facility] 07/04/2011 29.1   . MCHC 07/04/2011 33.8   . RDW 07/04/2011 14.2   . Platelets 07/04/2011 200   . Sodium 07/04/2011 141   . Potassium 07/04/2011 3.9   . Chloride 07/04/2011 104   . CO2 07/04/2011 30   . Glucose, Bld 07/04/2011 85   . BUN 07/04/2011 10   . Creat 07/04/2011 0.88   . Calcium 07/04/2011 9.5   . Hemoglobin A1C 07/04/2011 5.8*  . Mean Plasma Glucose 07/04/2011 120*  . Cholesterol 07/04/2011 211*  . Triglycerides 07/04/2011 121   . HDL 07/04/2011 47   . Total CHOL/HDL Ratio 07/04/2011 4.5   . VLDL 07/04/2011 24   . LDL Cholesterol 07/04/2011 140*  . TSH 07/04/2011 2.582   . H  Pylori IgG 07/04/2011 0.43   . Fecal Occult Blood, POC 07/04/2011 Negative   Orders Only on 07/04/2011  Component Date Value  . Iron 07/04/2011 34*  . Ferritin 07/04/2011 178      Results for this Opt Visit:     Results for orders placed in visit on 07/04/11  CBC      Component Value Range   WBC 5.3  4.0 - 10.5 K/uL   RBC 3.58 (*) 3.87 - 5.11 MIL/uL   Hemoglobin 10.4 (*) 12.0 - 15.0 g/dL   HCT 16.1 (*) 09.6 - 04.5 %   MCV 86.0  78.0 - 100.0 fL   MCH 29.1  26.0 - 34.0 pg   MCHC 33.8  30.0 - 36.0 g/dL   RDW 40.9  81.1 - 91.4 %   Platelets 200  150 - 400 K/uL  BASIC METABOLIC PANEL      Component Value Range   Sodium 141  135 - 145 mEq/L   Potassium 3.9  3.5 - 5.3 mEq/L   Chloride 104  96 - 112 mEq/L   CO2 30  19 - 32 mEq/L   Glucose, Bld 85  70 - 99 mg/dL   BUN 10  6 - 23 mg/dL   Creat 7.82  9.56 - 2.13 mg/dL   Calcium 9.5  8.4 - 08.6 mg/dL  HEMOGLOBIN V7Q      Component Value Range   Hemoglobin A1C 5.8 (*) <5.7 %   Mean Plasma Glucose 120 (*) <117 mg/dL  LIPID PANEL       Component Value Range   Cholesterol 211 (*) 0 - 200 mg/dL   Triglycerides 469  <629 mg/dL   HDL 47  >52 mg/dL   Total CHOL/HDL Ratio 4.5     VLDL 24  0 - 40 mg/dL   LDL Cholesterol 841 (*) 0 - 99 mg/dL  TSH      Component Value Range   TSH 2.582  0.350 - 4.500 uIU/mL  H. PYLORI ANTIBODY, IGG      Component Value Range   H Pylori IgG 0.43    HEMOCCULT (POC) BLOOD/STOOL TEST (OFFICE-1 CARD)      Component Value Range   Fecal Occult Blood, POC Negative      EKG Orders placed in visit on 08/07/11  . HOLTER MONITOR - 24 HOUR     Prior Assessment and Plan Problem List as of 09/07/2011            Cardiology Problems   Hypertension   Last Assessment & Plan Note   08/03/2011 Office Visit Addendum 08/03/2011 12:37 PM by Salley Scarlet, MD    Intolerant of multiple medications, has tried diuretic, maxide, norvasc, ACEI, ARB. Tolerating clonidine. Will f/u with cardiology for further recommendations. She is set up to wear monitor for BP monitoring starting Monday hopefully to determine if this is a " white coat hypertension". Discussed with PCP, I will not start any new medication today, will await recommendation from pt cardiologist  Reviewed labs with patient  I discussed with cardiology, with intolerance no change to medication, await monitor and his f/u appt in August    Hyperlipidemia   Last Assessment & Plan Note   07/04/2011 Office Visit Signed 07/04/2011  5:54 PM by Kerri Perches, MD    Hyperlipidemia:Low fat diet discussed and encouraged.  Updated lab today      Other   ABNORMAL ELECTROCARDIOGRAM   Last Assessment & Plan Note   06/19/2011 Office Visit  Signed 06/19/2011  1:17 PM by Kathlen Brunswick, MD    EKG suggests LVH.  Severity of process will be evaluated with echocardiography.    Prediabetes   Last Assessment & Plan Note   07/04/2011 Office Visit Signed 07/04/2011  5:54 PM by Kerri Perches, MD    Low carb diet, reduced sugar intake and weight loss stressed     Medication intolerance   Last Assessment & Plan Note   08/03/2011 Office Visit Signed 08/03/2011  9:28 AM by Salley Scarlet, MD    Intolerant of multiple medications with effects of dizziness, HA and muscle cramps with anti-hypertensives        Imaging: No results found.   FRS Calculation: Score not calculated. Missing: Total Cholesterol

## 2011-09-07 NOTE — Assessment & Plan Note (Signed)
Blood pressure control is suboptimal, and data appears to support severe whitecoat hypertension.  Patient has refused to carry a 24-hour blood pressure monitor.  She will continue to check pressures at home, will start lisinopril and will return on a monthly basis for further adjustment of antihypertensive therapy.  We will verify accuracy of her sphygmomanometer at her next visit.

## 2011-09-19 MED ORDER — SODIUM CHLORIDE 0.45 % IV SOLN
Freq: Once | INTRAVENOUS | Status: AC
  Administered 2011-09-20: 1000 mL via INTRAVENOUS

## 2011-09-20 ENCOUNTER — Encounter (HOSPITAL_COMMUNITY)
Admission: RE | Disposition: A | Discharge status: Home / Self Care | Payer: Self-pay | Source: Ambulatory Visit | Attending: Internal Medicine

## 2011-09-20 ENCOUNTER — Encounter (HOSPITAL_COMMUNITY): Payer: Self-pay | Admitting: *Deleted

## 2011-09-20 ENCOUNTER — Ambulatory Visit (HOSPITAL_COMMUNITY)
Admission: RE | Admit: 2011-09-20 | Discharge: 2011-09-20 | Disposition: A | Discharge status: Home / Self Care | Payer: Medicare PPO | Source: Ambulatory Visit | Attending: Internal Medicine | Admitting: Internal Medicine

## 2011-09-20 DIAGNOSIS — K644 Residual hemorrhoidal skin tags: Secondary | ICD-10-CM

## 2011-09-20 DIAGNOSIS — K5730 Diverticulosis of large intestine without perforation or abscess without bleeding: Secondary | ICD-10-CM

## 2011-09-20 DIAGNOSIS — Z1211 Encounter for screening for malignant neoplasm of colon: Secondary | ICD-10-CM

## 2011-09-20 DIAGNOSIS — I10 Essential (primary) hypertension: Secondary | ICD-10-CM | POA: Insufficient documentation

## 2011-09-20 DIAGNOSIS — E785 Hyperlipidemia, unspecified: Secondary | ICD-10-CM | POA: Insufficient documentation

## 2011-09-20 DIAGNOSIS — D126 Benign neoplasm of colon, unspecified: Secondary | ICD-10-CM | POA: Insufficient documentation

## 2011-09-20 HISTORY — PX: COLONOSCOPY: SHX5424

## 2011-09-20 SURGERY — COLONOSCOPY
Anesthesia: Moderate Sedation

## 2011-09-20 MED ORDER — STERILE WATER FOR IRRIGATION IR SOLN
Status: DC | PRN
  Administered 2011-09-20: 10:00:00

## 2011-09-20 MED ORDER — MIDAZOLAM HCL 5 MG/5ML IJ SOLN
INTRAMUSCULAR | Status: AC
  Filled 2011-09-20: qty 10

## 2011-09-20 MED ORDER — MIDAZOLAM HCL 5 MG/5ML IJ SOLN
INTRAMUSCULAR | Status: DC | PRN
  Administered 2011-09-20 (×2): 2 mg via INTRAVENOUS

## 2011-09-20 MED ORDER — MEPERIDINE HCL 50 MG/ML IJ SOLN
INTRAMUSCULAR | Status: AC
  Filled 2011-09-20: qty 1

## 2011-09-20 MED ORDER — MEPERIDINE HCL 50 MG/ML IJ SOLN
INTRAMUSCULAR | Status: DC | PRN
  Administered 2011-09-20 (×2): 25 mg via INTRAVENOUS

## 2011-09-20 NOTE — Op Note (Signed)
COLONOSCOPY PROCEDURE REPORT  PATIENT:  Sara Haynes  MR#:  [id number] Birthdate:  December 27, 1945, 66 y.o., female Endoscopist:  Dr. Malissa Hippo, MD Referred By:  Dr. Syliva Overman, MD Procedure Date: 09/20/2011  Procedure:   Colonoscopy  Indications: Patient is 66 year old African female who is here for average risk screening colonoscopy. This is patient's first exam.  Informed Consent:  The procedure and risks were reviewed with the patient and informed consent was obtained.  Medications:  Demerol 50 mg IV Versed 4 mg IV  Description of procedure:  After a digital rectal exam was performed, that colonoscope was advanced from the anus through the rectum and colon to the area of the cecum, ileocecal valve and appendiceal orifice. The cecum was deeply intubated. These structures were well-seen and photographed for the record. From the level of the cecum and ileocecal valve, the scope was slowly and cautiously withdrawn. The mucosal surfaces were carefully surveyed utilizing scope tip to flexion to facilitate fold flattening as needed. The scope was pulled down into the rectum where a thorough exam including retroflexion was performed.  Findings:   Prep satisfactory. Moderate number of diverticula noted at sigmoid colon. Small polyp ablated via cold biopsy from ascending colon. Normal rectal mucosa. Small hemorrhoids below the dentate line.  Therapeutic/Diagnostic Maneuvers Performed:  See above  Complications:  None  Cecal Withdrawal Time:  13 minutes  Impression:  Examination performed to cecum. Small polyp ablated via cold biopsy from ascending colon. Moderate sigmoid colon diverticulosis. Small external hemorrhoids.   Recommendations:  Standard instructions given. I will contact patient with results of biopsy and further recommendations.  REHMAN,NAJEEB U  09/20/2011 10:22 AM  CC: Dr. Syliva Overman, MD & Dr. Bonnetta Barry ref. provider found

## 2011-09-20 NOTE — H&P (Signed)
Sara Haynes is an 66 y.o. female.   Chief Complaint: Patient is here for colonoscopy. HPI: Patient is 67 year old African female who is here for screening colonoscopy. She denies abdominal pain change in bowel habits or rectal bleeding. This is patient's first exam. Family history is negative for colorectal carcinoma.  Past Medical History  Diagnosis Date  . Hypertension 1985    Never successfully treated.  . Hyperlipidemia 11/04/2010  . Abnormal electrocardiogram 02/03/2010    Sinus bradycardia  . Medication intolerance     Past Surgical History  Procedure Date  . Tubal ligation 1977  . Cataract extraction 2011    Bilateral  . Colonoscopy     Family History  Problem Relation Age of Onset  . COPD Mother   . Diabetes Mother   . Pneumonia Father   . Hypertension Sister   . Diabetes Brother   . Alcohol abuse Father    Social History:  reports that she has never smoked. She has never used smokeless tobacco. She reports that she does not drink alcohol or use illicit drugs.  Allergies:  Allergies  Allergen Reactions  . Amlodipine     All over cramps  . Hydrochlorothiazide W-Triamterene     REACTION: cramps    Medications Prior to Admission  Medication Sig Dispense Refill  . aspirin (ASPIRIN LOW DOSE) 81 MG EC tablet Take 81 mg by mouth daily.        . calcium-vitamin D (OSCAL 500/200 D-3) 500-200 MG-UNIT per tablet Take 1 tablet by mouth 3 (three) times daily.        . CHROMIUM PICOLINATE PO Take 1 capsule by mouth daily.      . cloNIDine (CATAPRES - DOSED IN MG/24 HR) 0.2 mg/24hr patch Place 1 patch (0.2 mg total) onto the skin once a week.  4 patch  12  . ibuprofen (ADVIL,MOTRIN) 200 MG tablet Take 200 mg by mouth every 6 (six) hours as needed. For pain      . lisinopril (PRINIVIL,ZESTRIL) 5 MG tablet Take 1 tablet (5 mg total) by mouth daily.  30 tablet  11  . Multiple Vitamins-Minerals (WOMENS MULTIVITAMIN PLUS) TABS Take by mouth daily.          No results  found for this or any previous visit (from the past 48 hour(s)). No results found.  ROS  Blood pressure 160/111, pulse 74, temperature 97.7 F (36.5 C), temperature source Oral, resp. rate 16, height 5\' 6"  (1.676 m), weight 190 lb (86.183 kg), SpO2 98.00%. Physical Exam  Constitutional: She appears well-developed and well-nourished.  HENT:  Mouth/Throat: Oropharynx is clear and moist.  Eyes: Conjunctivae normal are normal. No scleral icterus.  Neck: No thyromegaly present.  Cardiovascular: Normal rate, regular rhythm and normal heart sounds.   No murmur heard. Respiratory: Effort normal and breath sounds normal.  GI: Soft. She exhibits no distension and no mass. There is no tenderness.  Musculoskeletal: She exhibits no edema.  Lymphadenopathy:    She has no cervical adenopathy.  Neurological: She is alert.  Skin: Skin is warm and dry.     Assessment/Plan Average risk screening colonoscopy.  Sara Haynes U 09/20/2011, 9:48 AM

## 2011-09-22 ENCOUNTER — Encounter (HOSPITAL_COMMUNITY): Payer: Self-pay | Admitting: Internal Medicine

## 2011-09-26 ENCOUNTER — Other Ambulatory Visit: Payer: Self-pay | Admitting: Cardiology

## 2011-09-27 LAB — BASIC METABOLIC PANEL
CO2: 29 mEq/L (ref 19–32)
Chloride: 105 mEq/L (ref 96–112)
Glucose, Bld: 77 mg/dL (ref 70–99)
Potassium: 4 mEq/L (ref 3.5–5.3)
Sodium: 140 mEq/L (ref 135–145)

## 2011-09-29 ENCOUNTER — Other Ambulatory Visit: Payer: Self-pay | Admitting: *Deleted

## 2011-09-29 DIAGNOSIS — I10 Essential (primary) hypertension: Secondary | ICD-10-CM

## 2011-10-03 ENCOUNTER — Ambulatory Visit (INDEPENDENT_AMBULATORY_CARE_PROVIDER_SITE_OTHER): Payer: Medicare PPO | Admitting: Family Medicine

## 2011-10-03 ENCOUNTER — Encounter: Payer: Self-pay | Admitting: Family Medicine

## 2011-10-03 VITALS — BP 198/72 | HR 104 | Resp 18 | Ht 66.5 in | Wt 186.0 lb

## 2011-10-03 DIAGNOSIS — I10 Essential (primary) hypertension: Secondary | ICD-10-CM

## 2011-10-03 DIAGNOSIS — R7303 Prediabetes: Secondary | ICD-10-CM

## 2011-10-03 DIAGNOSIS — Z23 Encounter for immunization: Secondary | ICD-10-CM

## 2011-10-03 DIAGNOSIS — Z789 Other specified health status: Secondary | ICD-10-CM

## 2011-10-03 DIAGNOSIS — R7309 Other abnormal glucose: Secondary | ICD-10-CM

## 2011-10-03 MED ORDER — CLONIDINE HCL 0.2 MG/24HR TD PTWK: MEDICATED_PATCH | TRANSDERMAL | Status: DC

## 2011-10-03 NOTE — Progress Notes (Signed)
  Subjective:    Patient ID: Sara Haynes, female    DOB: 1945-09-04, 66 y.o.   MRN: [id number]  HPI The PT is here for follow up and re-evaluation of chronic medical conditions, medication management and review of any available recent lab and radiology data.  Preventive health is updated, specifically  Cancer screening and Immunization.  Needs zostavax , otherwise up to date Questions or concerns regarding consultations or procedures which the PT has had in the interim are  Addressed.Had colonoscopy, and is still under the care of cardiology attempting to get her blood pressure controlled. Today , states she stopped the latest addition ACE low dose, states she "felt drunk" .  There are no new concerns.  There are no specific complaints except recurrent lightheadedness with every medication adjustment attempted to normalize or near normalize her blood pressure      Review of Systems See HPI Denies recent fever or chills. Denies sinus pressure, nasal congestion, ear pain or sore throat. Denies chest congestion, productive cough or wheezing. Denies chest pains, palpitations and leg swelling Denies abdominal pain, nausea, vomiting,diarrhea or constipation.   Denies dysuria, frequency, hesitancy or incontinence. Denies joint pain, swelling and limitation in mobility. Denies headaches, seizures, numbness, or tingling. Denies depression, anxiety or insomnia. Denies skin break down or rash.        Objective:   Physical Exam  Patient alert and oriented and in no cardiopulmonary distress.  HEENT: No facial asymmetry, EOMI, no sinus tenderness,  oropharynx pink and moist.  Neck supple no adenopathy.  Chest: Clear to auscultation bilaterally.  CVS: S1, S2 no murmurs, no S3.  ABD: Soft non tender. Bowel sounds normal.  Ext: No edema  MS: Adequate ROM spine, shoulders, hips and knees.  Skin: Intact, no ulcerations or rash noted.  Psych: Good eye contact, normal affect.  Memory intact not anxious or depressed appearing.  CNS: CN 2-12 intact, power, tone and sensation normal throughout.       Assessment & Plan:

## 2011-10-03 NOTE — Patient Instructions (Addendum)
Pelvic and breast in December, please call if you need me before  Since you have stopped the lotensin, I will let the cardiology office know., and ask that they cancel appt for this week, but you need to keep the one in the middle of the month with Dr . Dietrich Pates  Increase the catapres patch to two patches to be applied once weekly starting this Saturday. BP today is 180/110  Flu vaccine today

## 2011-10-04 ENCOUNTER — Encounter (INDEPENDENT_AMBULATORY_CARE_PROVIDER_SITE_OTHER): Payer: Self-pay | Admitting: *Deleted

## 2011-10-04 DIAGNOSIS — Z23 Encounter for immunization: Secondary | ICD-10-CM

## 2011-10-05 ENCOUNTER — Other Ambulatory Visit: Payer: Self-pay | Admitting: *Deleted

## 2011-10-05 DIAGNOSIS — I10 Essential (primary) hypertension: Secondary | ICD-10-CM

## 2011-10-05 MED ORDER — CLONIDINE HCL 0.2 MG/24HR TD PTWK: MEDICATED_PATCH | TRANSDERMAL | Status: DC

## 2011-10-09 ENCOUNTER — Other Ambulatory Visit: Payer: Self-pay

## 2011-10-09 DIAGNOSIS — I10 Essential (primary) hypertension: Secondary | ICD-10-CM

## 2011-10-09 MED ORDER — CLONIDINE HCL 0.2 MG/24HR TD PTWK: MEDICATED_PATCH | TRANSDERMAL | Status: DC

## 2011-10-10 NOTE — Assessment & Plan Note (Signed)
Improved in May , though hBA1C still elevated, and she has a strong f/h of DM Patient educated about the importance of limiting  Carbohydrate intake , the need to commit to daily physical activity for a minimum of 30 minutes , and to commit weight loss. The fact that changes in all these areas will reduce or eliminate all together the development of diabetes is stressed.

## 2011-10-10 NOTE — Assessment & Plan Note (Signed)
Uncontrolled. Again stopped newest drug started by cardiology states she "was drunk" Will attempt to double clonidine patch, she is to continue to follow up with cardiology, this was made clear DASH diet and commitment to daily physical activity for a minimum of 30 minutes discussed and encouraged, as a part of hypertension management. The importance of attaining a healthy weight is also discussed. Encouraged use of beet and cucumber may be helpful with BP lowering

## 2011-10-10 NOTE — Assessment & Plan Note (Signed)
An ongoing problem where her main dx is concerned , hypertension

## 2011-10-13 ENCOUNTER — Encounter: Payer: Self-pay | Admitting: *Deleted

## 2011-11-07 ENCOUNTER — Encounter: Payer: Self-pay | Admitting: Cardiology

## 2011-11-07 ENCOUNTER — Ambulatory Visit (INDEPENDENT_AMBULATORY_CARE_PROVIDER_SITE_OTHER): Payer: Medicare PPO | Admitting: Cardiology

## 2011-11-07 VITALS — BP 210/110 | HR 80 | Ht 66.0 in | Wt 188.8 lb

## 2011-11-07 DIAGNOSIS — I10 Essential (primary) hypertension: Secondary | ICD-10-CM

## 2011-11-07 MED ORDER — VERAPAMIL HCL ER 180 MG PO CP24: 180.0000 mg | ORAL_CAPSULE | Freq: Every day | ORAL | Status: DC

## 2011-11-07 NOTE — Patient Instructions (Addendum)
Your physician recommends that you schedule a follow-up appointment in:  1 - 4 months with provider 2 - 1 month with nurse for blood pressure check 3 - 24 hour blood pressure monitor  Your physician has requested that you regularly monitor and record your blood pressure readings at home. Please use the same machine at the same time of day to check your readings and record them to bring to your follow-up visit.  Your physician has recommended you make the following change in your medication:  1 - Apply only ONE clonidine patch per week 2 - START Verapamil 180 mg daily

## 2011-11-07 NOTE — Assessment & Plan Note (Signed)
Blood pressure control remains limited to nonexistent.  Verapamil will be added to her medical regime with a decrease in transdermal clonidine to one patch per week.  She will purchase a new cuff and measure blood pressures at home, returning in one month for reassessment by the cardiology nurses.

## 2011-11-07 NOTE — Progress Notes (Signed)
Patient ID: Sara Haynes, female   DOB: 12-23-1945, 66 y.o.   MRN: [id number]  HPI: Scheduled return visit for this very nice woman with hypertension and hyperlipidemia.  Since her last visit, she has recorded blood pressures at home with generally good results.  She developed an adverse reaction to ACE inhibitor prompting discontinuation and has been using 2 clonidine level II patches simultaneously.  She generally has felt well with some dry mouth and fatigue.  She experiences occasional transient dizziness without loss of consciousness or falls  She performs all of her housework and cares for her grandchild without significant cardiopulmonary symptoms.  Patient brought her sphygmomanometer, which was tested.  It consistently gave "error" readings, apparently due to a leak in the blood pressure cuff.  Prior to Admission medications   Medication Sig Start Date End Date Taking? Authorizing Provider  aspirin (ASPIRIN LOW DOSE) 81 MG EC tablet Take 81 mg by mouth daily.     Yes Historical Provider, MD  calcium-vitamin D (OSCAL 500/200 D-3) 500-200 MG-UNIT per tablet Take 1 tablet by mouth 3 (three) times daily.     Yes Historical Provider, MD  CHROMIUM PICOLINATE PO Take 1 capsule by mouth daily.   Yes Historical Provider, MD  cloNIDine (CATAPRES - DOSED IN MG/24 HR) 0.2 mg/24hr patch Dose increase effective 10/03/2011.  Apply two patches every Saturday 10/7/ha3 10/08/12 Yes Kerri Perches, MD  ibuprofen (ADVIL,MOTRIN) 200 MG tablet Take 200 mg by mouth every 6 (six) hours as needed. For pain   Yes Historical Provider, MD  Multiple Vitamins-Minerals (WOMENS MULTIVITAMIN PLUS) TABS Take by mouth daily.     Yes Historical Provider, MD   Allergies  Allergen Reactions  . Amlodipine     All over cramps  . Hydrochlorothiazide W-Triamterene     REACTION: cramps  . Lisinopril     Dizzy and leg cramps      Past medical history, social history, and family history reviewed and updated.  ROS:  Denies orthopnea, PND, palpitations, or syncope.  All other systems reviewed and are negative.  PHYSICAL EXAM: BP 210/110  Pulse 80  Ht 5\' 6"  (1.676 m)  Wt 85.639 kg (188 lb 12.8 oz)  BMI 30.47 kg/m2  SpO2 97% ; Repeat blood pressure: 170/100 in the left arm sitting General-Well developed; no acute distress Body habitus-Mildly overweight Neck-No JVD; no carotid bruits Lungs-clear lung fields; resonant to percussion Cardiovascular-normal PMI; normal S1 and S2; fourth heart sound present Abdomen-normal bowel sounds; soft and non-tender without masses or organomegaly Musculoskeletal-No deformities, no cyanosis or clubbing Neurologic-Normal cranial nerves; symmetric strength and tone Skin-Warm, no significant lesions Extremities-distal pulses intact; no edema  ASSESSMENT AND PLAN:  [place] Bing, MD 11/07/2011 1:56 PM

## 2011-11-07 NOTE — Progress Notes (Deleted)
Name: Sara Haynes    DOB: 10-Dec-1945  Age: 66 y.o.  MR#: [id number]       PCP:  Syliva Overman, MD      Insurance: @PAYORNAME @   CC:   No chief complaint on file.  MED LIST  VS BP 210/110  Pulse 80  Ht 5\' 6"  (1.676 m)  Wt 188 lb 12.8 oz (85.639 kg)  BMI 30.47 kg/m2  SpO2 97%  Weights Current Weight  11/07/11 188 lb 12.8 oz (85.639 kg)  10/03/11 186 lb 0.6 oz (84.387 kg)  09/20/11 190 lb (86.183 kg)    Blood Pressure  BP Readings from Last 3 Encounters:  11/07/11 210/110  10/03/11 198/72  09/20/11 145/81     Admit date:  (Not on file) Last encounter with RMR:  09/26/2011   Allergy Allergies  Allergen Reactions  . Amlodipine     All over cramps  . Hydrochlorothiazide W-Triamterene     REACTION: cramps  . Lisinopril     Dizzy and leg cramps     Current Outpatient Prescriptions  Medication Sig Dispense Refill  . aspirin (ASPIRIN LOW DOSE) 81 MG EC tablet Take 81 mg by mouth daily.        . calcium-vitamin D (OSCAL 500/200 D-3) 500-200 MG-UNIT per tablet Take 1 tablet by mouth 3 (three) times daily.        . CHROMIUM PICOLINATE PO Take 1 capsule by mouth daily.      . cloNIDine (CATAPRES - DOSED IN MG/24 HR) 0.2 mg/24hr patch Dose increase effective 10/03/2011.  Apply two patches every Saturday  24 patch  1  . ibuprofen (ADVIL,MOTRIN) 200 MG tablet Take 200 mg by mouth every 6 (six) hours as needed. For pain      . Multiple Vitamins-Minerals (WOMENS MULTIVITAMIN PLUS) TABS Take by mouth daily.        . [DISCONTINUED] hydrochlorothiazide (HYDRODIURIL) 25 MG tablet Take 1 tablet by mouth Daily.      . [DISCONTINUED] potassium chloride SA (K-DUR,KLOR-CON) 20 MEQ tablet Take 1 tablet by mouth Twice daily.        Discontinued Meds:   There are no discontinued medications.  Patient Active Problem List  Diagnosis  . Hypertension  . ABNORMAL ELECTROCARDIOGRAM  . Hyperlipidemia  . Prediabetes  . Medication intolerance    LABS Orders Only on 09/26/2011    Component Date Value  . Sodium 09/26/2011 140   . Potassium 09/26/2011 4.0   . Chloride 09/26/2011 105   . CO2 09/26/2011 29   . Glucose, Bld 09/26/2011 77   . BUN 09/26/2011 11   . Creat 09/26/2011 0.94   . Calcium 09/26/2011 9.3      Results for this Opt Visit:     Results for orders placed in visit on 09/26/11  BASIC METABOLIC PANEL      Component Value Range   Sodium 140  135 - 145 mEq/L   Potassium 4.0  3.5 - 5.3 mEq/L   Chloride 105  96 - 112 mEq/L   CO2 29  19 - 32 mEq/L   Glucose, Bld 77  70 - 99 mg/dL   BUN 11  6 - 23 mg/dL   Creat 6.21  3.08 - 6.57 mg/dL   Calcium 9.3  8.4 - 84.6 mg/dL    EKG Orders placed in visit on 08/07/11  . HOLTER MONITOR - 24 HOUR     Prior Assessment and Plan Problem List as of 11/07/2011  Cardiology Problems   Hypertension   Last Assessment & Plan Note   10/03/2011 Office Visit Signed 10/10/2011  5:06 PM by Kerri Perches, MD    Uncontrolled. Again stopped newest drug started by cardiology states she "was drunk" Will attempt to double clonidine patch, she is to continue to follow up with cardiology, this was made clear DASH diet and commitment to daily physical activity for a minimum of 30 minutes discussed and encouraged, as a part of hypertension management. The importance of attaining a healthy weight is also discussed. Encouraged use of beet and cucumber may be helpful with BP lowering    Hyperlipidemia   Last Assessment & Plan Note   09/07/2011 Office Visit Signed 09/07/2011  6:58 PM by Kathlen Brunswick, MD    Lipid profile is suboptimal, and patient has multiple cardiovascular risk factors but no known vascular disease.  Pharmacologic therapy is not necessarily warranted.      Other   ABNORMAL ELECTROCARDIOGRAM   Last Assessment & Plan Note   06/19/2011 Office Visit Signed 06/19/2011  1:17 PM by Kathlen Brunswick, MD    EKG suggests LVH.  Severity of process will be evaluated with echocardiography.     Prediabetes   Last Assessment & Plan Note   10/03/2011 Office Visit Signed 10/10/2011  5:07 PM by Kerri Perches, MD    Improved in May , though hBA1C still elevated, and she has a strong f/h of DM Patient educated about the importance of limiting  Carbohydrate intake , the need to commit to daily physical activity for a minimum of 30 minutes , and to commit weight loss. The fact that changes in all these areas will reduce or eliminate all together the development of diabetes is stressed.       Medication intolerance   Last Assessment & Plan Note   10/03/2011 Office Visit Signed 10/10/2011  5:08 PM by Kerri Perches, MD    An ongoing problem where her main dx is concerned , hypertension        Imaging: No results found.   FRS Calculation: Score not calculated. Missing: Total Cholesterol

## 2011-11-13 ENCOUNTER — Ambulatory Visit (HOSPITAL_COMMUNITY)
Admission: RE | Admit: 2011-11-13 | Discharge: 2011-11-13 | Disposition: A | Discharge status: Home / Self Care | Payer: Medicare PPO | Source: Ambulatory Visit | Attending: Cardiology | Admitting: Cardiology

## 2011-11-13 DIAGNOSIS — I10 Essential (primary) hypertension: Secondary | ICD-10-CM

## 2011-11-13 NOTE — Progress Notes (Signed)
Ambulatory Blood Pressure Monitor in progress 24 hr. 

## 2011-11-17 ENCOUNTER — Other Ambulatory Visit: Payer: Self-pay | Admitting: *Deleted

## 2011-11-17 ENCOUNTER — Telehealth: Payer: Self-pay | Admitting: Cardiology

## 2011-11-17 MED ORDER — CARVEDILOL 12.5 MG PO TABS: 12.5000 mg | ORAL_TABLET | Freq: Two times a day (BID) | ORAL | Status: DC

## 2011-11-17 MED ORDER — LOSARTAN POTASSIUM 100 MG PO TABS: 100.0000 mg | ORAL_TABLET | Freq: Every day | ORAL | Status: DC

## 2011-11-17 NOTE — Telephone Encounter (Signed)
Patient made aware of recommendations and verbalized understanding.  Pt has an allergy listed related to lisinopril causing a contraindication to pop up when losartan ordered, however was by passed, as reaction was listed as stomach cramps.

## 2011-11-17 NOTE — Telephone Encounter (Signed)
24 hour ambulatory blood pressure results indicate poor control of hypertension. We will increase therapy and monitor compliance.  Coreg 12.5 mg twice a day Losartan 100 mg per day Home BPs Blood pressure check and pill counts in 3 weeks.

## 2011-11-17 NOTE — Telephone Encounter (Signed)
Attempted to contact patient.  Mailbox is full and I am unable to leave a message at this time.

## 2011-12-07 ENCOUNTER — Ambulatory Visit (INDEPENDENT_AMBULATORY_CARE_PROVIDER_SITE_OTHER): Payer: Medicare PPO | Admitting: Adult Health

## 2011-12-07 VITALS — BP 218/110 | HR 108 | Ht 66.5 in | Wt 189.1 lb

## 2011-12-07 DIAGNOSIS — I10 Essential (primary) hypertension: Secondary | ICD-10-CM

## 2011-12-07 MED ORDER — CARVEDILOL 25 MG PO TABS: 25.0000 mg | ORAL_TABLET | Freq: Two times a day (BID) | ORAL | Status: DC

## 2011-12-07 NOTE — Progress Notes (Signed)
Reviewed BP records. She is not taking medications as directed. Not taking losartan or coreg as she has not picked them up from the pharmacy. Is unhappy with medications that she is taking because they make her feel bad.   I have advised the nurses to go up on the Coreg to 25 mg BID, take the verapamil as directed, and continue catapress patch. She can hold off on the ARB for now.Do not want to load her up with too many changes at once when she is having trouble taking the medications currently prescribed prior to the recent additions from Dr. Dietrich Pates. They are also to advise the patient that she will feel bad at first when taking the medications until her body has become used to the medications. Medical noncompliance can lead to CVA or hospitalization if she persists in this behavior. The nurses have called her. She will follow up.

## 2011-12-07 NOTE — Patient Instructions (Addendum)
Your physician recommends that you schedule a follow-up appointment in:  For Monday 12/11/2011 TO RECHECK BLOOD PRESSURE  Your physician has recommended you make the following change in your medication:   1) CONTINUE Clonidine 0.2 APPLY TWO PATCHES EVERY Saturday 2) CONTINUE VERAPAMIL 180MG  ONCE DAILY AT BEDTIME 3) START COREG 25MG  TWICE DAILY (YOU CAN TAKE WITH A MEAL TO EASE POSSIBLE SIDE EFFECTS) 4) DO NOT PICK UP COREG 12.5MG  OR LOSARTAN FROM PHARMACY TODAY

## 2011-12-14 ENCOUNTER — Telehealth: Payer: Self-pay | Admitting: Family Medicine

## 2012-01-01 ENCOUNTER — Encounter: Payer: Medicare PPO | Admitting: Family Medicine

## 2012-01-05 NOTE — Telephone Encounter (Signed)
Attempted to contact patient. Voicemail not set up.

## 2012-01-26 ENCOUNTER — Encounter: Payer: Medicare PPO | Admitting: Family Medicine

## 2012-03-08 ENCOUNTER — Ambulatory Visit: Payer: Self-pay | Admitting: Cardiology

## 2012-07-19 ENCOUNTER — Telehealth: Payer: Self-pay | Admitting: *Deleted

## 2012-07-19 NOTE — Telephone Encounter (Signed)
This nurse called ML NP to advise an apt has been scheduled for 07-23-12 at 1:15pm with Dr PN per Dr Macarthur Critchley has no available apts, noted ML vm box at [phone] is full unable to leave message, no call back noted from [facility] NP to advise if pt BP came down prior to leaving pt home will call ML again on Monday, called pt to advise about apt and update BP readings, pt vm box notes not set up yet times 3, will try to contact pt again on Monday when office re-opens

## 2012-07-19 NOTE — Telephone Encounter (Signed)
Noted incoming call from Cherre Huger NP from [facility] to advise she is with the pt and notes her BP as 188/100, pt admits that she stopped taking her medication months ago per it all made her feel bad, ML NP advised she contacted the pt PCP and notes she does not work on fridays, advised pt to put Clonidine patch on and take all other of her BP medications while still with the pt to see if the BP will come down prior to leaving the pt home, noted no apt available for pt today nor with DR RR next Monday, ML NP requested the pt be set up for a HTN specialist as well as a noting a nodule on the pt thyroid that she feels needs to be checked as well, pt is noted as asymptomatic at this time, ML NP advised she will call back to our office to update pt current BP to see if medications will bring this down, please advise instructions for pt in absence of DR RR per request from [facility] NP

## 2012-07-22 NOTE — Telephone Encounter (Signed)
Called ML NP once again to advise the pt has an apt tomorrow however this nurse has called her Friday and today with no contact made, to advise pt about apt date and time, ML advised she will also try to contact the pt again, this nurse called the spouse number listed and noted no longer working, left vm for pt daughter Omega to advise that the pt has an apt tomorrow with Dr PN at 1:15pm, to please call our office to confirm

## 2012-07-23 ENCOUNTER — Encounter: Payer: Self-pay | Admitting: *Deleted

## 2012-07-23 ENCOUNTER — Ambulatory Visit (INDEPENDENT_AMBULATORY_CARE_PROVIDER_SITE_OTHER): Payer: Medicare PPO | Admitting: Cardiovascular Disease

## 2012-07-23 ENCOUNTER — Encounter: Payer: Self-pay | Admitting: Cardiovascular Disease

## 2012-07-23 VITALS — BP 182/91 | HR 90 | Ht 66.0 in | Wt 174.0 lb

## 2012-07-23 DIAGNOSIS — R9431 Abnormal electrocardiogram [ECG] [EKG]: Secondary | ICD-10-CM

## 2012-07-23 DIAGNOSIS — I10 Essential (primary) hypertension: Secondary | ICD-10-CM

## 2012-07-23 DIAGNOSIS — R7309 Other abnormal glucose: Secondary | ICD-10-CM

## 2012-07-23 DIAGNOSIS — R7303 Prediabetes: Secondary | ICD-10-CM

## 2012-07-23 NOTE — Assessment & Plan Note (Signed)
Low carb diet F/U primary for A1c

## 2012-07-23 NOTE — Assessment & Plan Note (Signed)
Patient not willing to take meds as directed Says it makes no difference.  She will f/u with primary and would need to seek advice from nephrology or other MD in future.  Will send letter to Dr Dietrich Pates to have patient no longer seen in our practice due to non compliance and also yelling and hollering in the waiting room.

## 2012-07-23 NOTE — Telephone Encounter (Signed)
Noted pt came into office to evaluation, advised MD PN that she only placed clonidine patch on while ML NP was in her home for visit, pt noted as non compliant with medications/instructions given by DR PN, notations made by DR PN in chart concerning OV today, ML NP with [employer] advised via mobile pt was non-compliant, ML will update pt information, manager YL made aware to contact pt daughter regarding today OV

## 2012-07-23 NOTE — Assessment & Plan Note (Signed)
Related to HTN and LVH  Echo 6/19 with normal EF 55% only mild AR and MR

## 2012-07-23 NOTE — Patient Instructions (Addendum)
Your physician recommends that you schedule a follow-up appointment in: AS NEEDED  

## 2012-07-23 NOTE — Progress Notes (Signed)
Patient ID: Sara Haynes, female   DOB: 06-30-1945, 67 y.o.   MRN: [id number] Patient of Dr Dietrich Pates with refractory HTN.  Patient very beligerent today.  Nurse saw her last week and was alarmed at elevated BP but patient not taking any meds.  Patient indicated she starting putting catapres patch on last week but not taking any other meds.  Adverse reactions to medications were reviewed with her. She has had difficulty with amlodipine, hydrochlorothiazide and Benicar, but can recall no other drug intolerances.  Reviewed Ambulatory BP report ordered by Dr Dietrich Pates 11/13 and Systolic BP greater than Hg 73% of time and diastolic greater than 90 48% of time.  Reviewed renal duplex from 3/13 and normal with no RAS.  Cr and K normal 9/13    I tried to explain to her that her BP wont be controlled unless she takes her meds as instructed and she said it made no difference.  No TIA, chest pain palpitations. Diet is still poor and high in salt.  She also indicated that she didn't think her BP was that high and it goes up and down.  1/13  SR 94  ECG does have LVH  Denies excess ETOH or NSAI's  ROS: Denies fever, malais, weight loss, blurry vision, decreased visual acuity, cough, sputum, SOB, hemoptysis, pleuritic pain, palpitaitons, heartburn, abdominal pain, melena, lower extremity edema, claudication, or rash.  All other systems reviewed and negative  General: Affect appropriate Healthy:  appears stated age HEENT: normal Neck supple with no adenopathy JVP normal no bruits no thyromegaly Lungs clear with no wheezing and good diaphragmatic motion Heart:  S1/S2 no murmur, no rub, gallop or click PMI normal Abdomen: benighn, BS positve, no tenderness, no AAA no bruit.  No HSM or HJR Distal pulses intact with no bruits No edema Neuro non-focal Skin warm and dry No muscular weakness   Current Outpatient Prescriptions  Medication Sig Dispense Refill  . aspirin (ASPIRIN LOW DOSE) 81 MG EC tablet  Take 81 mg by mouth daily.        . calcium-vitamin D (OSCAL 500/200 D-3) 500-200 MG-UNIT per tablet Take 1 tablet by mouth 3 (three) times daily.        . carvedilol (COREG) 25 MG tablet Take 1 tablet (25 mg total) by mouth 2 (two) times daily with a meal.  60 tablet  3  . cloNIDine (CATAPRES - DOSED IN MG/24 HR) 0.2 mg/24hr patch Dose increase effective 10/03/2011.  Apply two patches every Saturday  24 patch  1  . ibuprofen (ADVIL,MOTRIN) 200 MG tablet Take 200 mg by mouth every 6 (six) hours as needed. For pain      . losartan (COZAAR) 100 MG tablet Take 1 tablet (100 mg total) by mouth daily.  30 tablet  6  . Multiple Vitamins-Minerals (WOMENS MULTIVITAMIN PLUS) TABS Take by mouth daily.        . [DISCONTINUED] hydrochlorothiazide (HYDRODIURIL) 25 MG tablet Take 1 tablet by mouth Daily.      . [DISCONTINUED] potassium chloride SA (K-DUR,KLOR-CON) 20 MEQ tablet Take 1 tablet by mouth Twice daily.       No current facility-administered medications for this visit.    Allergies  Amlodipine; Hydrochlorothiazide w-triamterene; and Lisinopril  Electrocardiogram:  1/13  SR LVH   Assessment and Plan

## 2012-08-02 ENCOUNTER — Telehealth: Payer: Self-pay | Admitting: Cardiology

## 2012-08-02 NOTE — Telephone Encounter (Signed)
I was asked to review the suggestion that patient be discharged from practice due to noncompliance. I think it preferable that we be available to assist with her care as needed, especially in the absence of other cardiologist in the community. She is currently to be seen as needed with hypertension to be managed by her primary care physician.

## 2012-08-02 NOTE — Telephone Encounter (Signed)
Noted, pt PRN as advised at last OV 07-23-12

## 2012-08-23 ENCOUNTER — Encounter: Payer: Self-pay | Admitting: Family Medicine

## 2012-08-23 ENCOUNTER — Ambulatory Visit (INDEPENDENT_AMBULATORY_CARE_PROVIDER_SITE_OTHER): Payer: Medicare PPO | Admitting: Family Medicine

## 2012-08-23 ENCOUNTER — Other Ambulatory Visit: Payer: Self-pay | Admitting: Family Medicine

## 2012-08-23 VITALS — BP 200/110 | HR 99 | Resp 16 | Ht 66.0 in | Wt 169.0 lb

## 2012-08-23 DIAGNOSIS — R7309 Other abnormal glucose: Secondary | ICD-10-CM

## 2012-08-23 DIAGNOSIS — R7303 Prediabetes: Secondary | ICD-10-CM

## 2012-08-23 DIAGNOSIS — Z1239 Encounter for other screening for malignant neoplasm of breast: Secondary | ICD-10-CM

## 2012-08-23 DIAGNOSIS — Z789 Other specified health status: Secondary | ICD-10-CM

## 2012-08-23 DIAGNOSIS — E785 Hyperlipidemia, unspecified: Secondary | ICD-10-CM

## 2012-08-23 DIAGNOSIS — I10 Essential (primary) hypertension: Secondary | ICD-10-CM

## 2012-08-23 LAB — HEMOGLOBIN A1C: Mean Plasma Glucose: 105 mg/dL (ref <117)

## 2012-08-23 LAB — TSH: TSH: 1.797 u[IU]/mL (ref 0.350–4.500)

## 2012-08-23 LAB — COMPREHENSIVE METABOLIC PANEL
AST: 19 U/L (ref 0–37)
Albumin: 4.6 g/dL (ref 3.5–5.2)
BUN: 13 mg/dL (ref 6–23)
Calcium: 9.7 mg/dL (ref 8.4–10.5)
Chloride: 104 mEq/L (ref 96–112)
Glucose, Bld: 104 mg/dL — ABNORMAL HIGH (ref 70–99)
Potassium: 3.7 mEq/L (ref 3.5–5.3)

## 2012-08-23 LAB — CBC WITH DIFFERENTIAL/PLATELET
HCT: 33.7 % — ABNORMAL LOW (ref 36.0–46.0)
Hemoglobin: 11.4 g/dL — ABNORMAL LOW (ref 12.0–15.0)
Lymphocytes Relative: 51 % — ABNORMAL HIGH (ref 12–46)
Monocytes Absolute: 0.2 10*3/uL (ref 0.1–1.0)
Monocytes Relative: 6 % (ref 3–12)
Neutro Abs: 1.5 10*3/uL — ABNORMAL LOW (ref 1.7–7.7)
WBC: 3.7 10*3/uL — ABNORMAL LOW (ref 4.0–10.5)

## 2012-08-23 LAB — LIPID PANEL
HDL: 55 mg/dL (ref >39)
Triglycerides: 80 mg/dL (ref <150)

## 2012-08-23 MED ORDER — AMLODIPINE BESYLATE 2.5 MG PO TABS: ORAL_TABLET | ORAL | Status: DC

## 2012-08-23 MED ORDER — POTASSIUM CHLORIDE CRYS ER 20 MEQ PO TBCR: 20.0000 meq | EXTENDED_RELEASE_TABLET | Freq: Two times a day (BID) | ORAL | Status: DC

## 2012-08-23 MED ORDER — HYDROCHLOROTHIAZIDE 25 MG PO TABS: ORAL_TABLET | ORAL | Status: DC

## 2012-08-23 NOTE — Progress Notes (Signed)
  Subjective:    Patient ID: Sara Haynes, female    DOB: 31-Oct-1945, 67 y.o.   MRN: [id number]  HPI The PT is here for follow up and re-evaluation of chronic medical conditions, medication management and review of any available recent lab and radiology data.  Preventive health is updated, specifically  Cancer screening and Immunization.   Recent OV with cardiology re alarming BP , which is NOT new, did not go well, and pt will need to establish with an alternate group.Today she brings in 3 meds, hCTZ, amlodipine 2.5mg  and potassium, last taken in 01/2011, which she states she is "able to tolerate" All other medications which she has been tried on make her "drunk and dizzy", hopefully this will not be the case this time around, but clearly at some time in the interim , she experienced s/e which led her to stop them Pt also continues to state that her BP is "normal" citing indeed normal values at home with her cuff, which she has not brought,  But clearly the medical evidence shows otherwise. She has been working on lifestyle change with great success, states she feels extremely well, and absolutely enjoys caring for her great grand babies     Review of Systems See HPI Denies recent fever or chills. Denies sinus pressure, nasal congestion, ear pain or sore throat. Denies chest congestion, productive cough or wheezing. Denies chest pains, palpitations and leg swelling Denies abdominal pain, nausea, vomiting,diarrhea or constipation.   Denies dysuria, frequency, hesitancy or incontinence. Denies joint pain, swelling and limitation in mobility. Denies headaches, seizures, numbness, or tingling. Denies depression, anxiety or insomnia. Denies skin break down or rash.        Objective:   Physical Exam Patient alert and oriented and in no cardiopulmonary distress.  HEENT: No facial asymmetry, EOMI, no sinus tenderness,  oropharynx pink and moist.  Neck supple no adenopathy.  Chest:  Clear to auscultation bilaterally.  CVS: S1, S2 no murmurs, no S3.  ABD: Soft non tender. Bowel sounds normal.  Ext: No edema  MS: Adequate ROM spine, shoulders, hips and knees.  Skin: Intact, no ulcerations or rash noted.  Psych: Good eye contact, normal affect. Memory intact not anxious or depressed appearing.  CNS: CN 2-12 intact, power, tone and sensation normal throughout.        Assessment & Plan:

## 2012-08-23 NOTE — Patient Instructions (Addendum)
Annual wellness in 4 weeks,  Call if you need me before  Labs today, cbc.lipid, cmp and eGFR, HBA1c , tSH  Today and hBA1C  Please take the 3 medications we discussed, you know you are able to take these, I have not changed any dose from what you have had before  Mammogram will be scheduled for you, stop on the way out  It is important that you exercise regularly at least 30 minutes 5 times a week. If you develop chest pain, have severe difficulty breathing, or feel very tired, stop exercising immediately and seek medical attention   Continue to eat a lot of vegetable and fruit

## 2012-08-24 NOTE — Assessment & Plan Note (Signed)
Uncontrolled DASH diet and commitment to daily physical activity for a minimum of 30 minutes discussed and encouraged, as a part of hypertension management. The importance of attaining a healthy weight is also discussed. Pt to start medication today  That she agrees upon

## 2012-08-24 NOTE — Assessment & Plan Note (Signed)
Improved , pt to be congratulated

## 2012-08-24 NOTE — Assessment & Plan Note (Signed)
Unchanged, start amlodipine , HCTZ and potassium

## 2012-08-24 NOTE — Assessment & Plan Note (Signed)
Unchanged, will discuss low fat diet  Hyperlipidemia:Low fat diet discussed and encouraged.

## 2012-08-30 ENCOUNTER — Ambulatory Visit (HOSPITAL_COMMUNITY)
Admission: RE | Admit: 2012-08-30 | Discharge: 2012-08-30 | Disposition: A | Discharge status: Home / Self Care | Payer: Medicare PPO | Source: Ambulatory Visit | Attending: Family Medicine | Admitting: Family Medicine

## 2012-08-30 DIAGNOSIS — Z1231 Encounter for screening mammogram for malignant neoplasm of breast: Secondary | ICD-10-CM | POA: Insufficient documentation

## 2012-08-30 DIAGNOSIS — Z1239 Encounter for other screening for malignant neoplasm of breast: Secondary | ICD-10-CM

## 2012-09-27 ENCOUNTER — Ambulatory Visit (INDEPENDENT_AMBULATORY_CARE_PROVIDER_SITE_OTHER): Payer: Medicare PPO | Admitting: Family Medicine

## 2012-09-27 ENCOUNTER — Encounter: Payer: Self-pay | Admitting: Family Medicine

## 2012-09-27 VITALS — BP 168/90 | HR 77 | Resp 16 | Ht 66.0 in | Wt 167.4 lb

## 2012-09-27 DIAGNOSIS — I10 Essential (primary) hypertension: Secondary | ICD-10-CM

## 2012-09-27 DIAGNOSIS — Z23 Encounter for immunization: Secondary | ICD-10-CM

## 2012-09-27 DIAGNOSIS — Z0000 Encounter for general adult medical examination without abnormal findings: Secondary | ICD-10-CM

## 2012-09-27 DIAGNOSIS — Z1211 Encounter for screening for malignant neoplasm of colon: Secondary | ICD-10-CM

## 2012-09-27 MED ORDER — AMLODIPINE BESYLATE 5 MG PO TABS: 5.0000 mg | ORAL_TABLET | Freq: Every day | ORAL | Status: DC

## 2012-09-27 MED ORDER — HYDROCHLOROTHIAZIDE 25 MG PO TABS: ORAL_TABLET | ORAL | Status: DC

## 2012-09-27 NOTE — Assessment & Plan Note (Signed)
Annual wellness as documented. Pt is fully functional with no limitations She is encouraged to continue healthy lifestyle to improve and maintain best healt possible Blood pressure has been a major issue, fortunately, she has tolerated most recent regime and is agreeable to dose increase in amlodipine which she needs

## 2012-09-27 NOTE — Assessment & Plan Note (Signed)
Rectal exam performed at visit as due. Pt denied any change in bowel movements or rectal blood Exam is negative for mass or hidden blood

## 2012-09-27 NOTE — Patient Instructions (Addendum)
F/u in 2 month, call if you need me before  Congrats on improved blood pressure, increase amlodipine 2.5mg  tablets to TWO at bedtime, new tablet will be 5mg  ONE at bedtime. Please start increased dose tonight  Flu vaccine today, also rectal exam for colon screen  Chem 7 in 2 month, before visit  Lemon/lime water is excellent to drink

## 2012-09-27 NOTE — Progress Notes (Signed)
Subjective:    Patient ID: Sara Haynes, female    DOB: 04-Jul-1945, 67 y.o.   MRN: [id number]  HPI Preventive Screening-Counseling & Management   Patient present here today for a Medicare annual wellness visit.   Current Problems (verified)   Medications Prior to Visit Allergies (verified)   PAST HISTORY  Family History  Social History Married x 49 years , 3 adult children Never any drug use. Retired as a Lawyer at age 39   Risk Factors  Current exercise habits:    Dietary issues discussed:   Cardiac risk factors:   Depression Screen  (Note: if answer to either of the following is "Yes", a more complete depression screening is indicated)   Over the past two weeks, have you felt down, depressed or hopeless? No  Over the past two weeks, have you felt little interest or pleasure in doing things? No  Have you lost interest or pleasure in daily life? No  Do you often feel hopeless? No  Do you cry easily over simple problems? No   Activities of Daily Living  In your present state of health, do you have any difficulty performing the following activities?  Driving?: No Managing money?: No Feeding yourself?:No Getting from bed to chair?:No Climbing a flight of stairs?:No Preparing food and eating?:No Bathing or showering?:No Getting dressed?:No Getting to the toilet?:No Using the toilet?:No Moving around from place to place?: No  Fall Risk Assessment In the past year have you fallen or had a near fall?:No Are you currently taking any medications that make you dizzy?:No ( a lot of BP meds have caused this in the past)   Hearing Difficulties: No Do you often ask people to speak up or repeat themselves?:No Do you experience ringing or noises in your ears?:yes tinnitus over 5 years Do you have difficulty understanding soft or whispered voices?:No  Cognitive Testing  Alert? Yes Normal Appearance?Yes  Oriented to person? Yes Place? Yes  Time? Yes  Displays  appropriate judgment?Yes  Can read the correct time from a watch face? yes Are you having problems remembering things?No  Advanced Directives have been discussed with the patient?Yes , full code   List the Names of Other Physician/Practitioners you currently use: opthalmology   Indicate any recent Medical Services you may have received from other than [facility] providers in the past year (date may be approximate).   Assessment:    Annual Wellness Exam   Plan:    During the course of the visit the patient was educated and counseled about appropriate screening and preventive services including:  A healthy diet is rich in fruit, vegetables and whole grains. Poultry fish, nuts and beans are a healthy choice for protein rather then red meat. A low sodium diet and drinking 64 ounces of water daily is generally recommended. Oils and sweet should be limited. Carbohydrates especially for those who are diabetic or overweight, should be limited to 30-45 gram per meal. It is important to eat on a regular schedule, at least 3 times daily. Snacks should be primarily fruits, vegetables or nuts. It is important that you exercise regularly at least 30 minutes 5 times a week. If you develop chest pain, have severe difficulty breathing, or feel very tired, stop exercising immediately and seek medical attention  Immunization reviewed and updated. Cancer screening reviewed and updated    Patient Instructions (the written plan) was given to the patient.  Medicare Attestation  I have personally reviewed:  The patient's medical and social  history  Their use of alcohol, tobacco or illicit drugs  Their current medications and supplements  The patient's functional ability including ADLs,fall risks, home safety risks, cognitive, and hearing and visual impairment  Diet and physical activities  Evidence for depression or mood disorders  The patient's weight, height, BMI, and visual acuity have been recorded in the  chart. I have made referrals, counseling, and provided education to the patient based on review of the above and I have provided the patient with a written personalized care plan for preventive services.      Review of Systems     Objective:   Physical Exam        Assessment & Plan:

## 2012-09-27 NOTE — Assessment & Plan Note (Signed)
Improved, but still uncontrolled. Increase in amlodipine to 5mg  daily agreed upon with pt DASH diet and commitment to daily physical activity for a minimum of 30 minutes discussed and encouraged, as a part of hypertension management. The importance of attaining a healthy weight is also discussed.

## 2012-09-30 LAB — POC HEMOCCULT BLD/STL (OFFICE/1-CARD/DIAGNOSTIC): Fecal Occult Blood, POC: NEGATIVE

## 2012-09-30 NOTE — Addendum Note (Signed)
Addended by: Abner Greenspan on: 09/30/2012 08:21 AM   Modules accepted: Orders

## 2012-11-09 LAB — BASIC METABOLIC PANEL
BUN: 15 mg/dL (ref 6–23)
CO2: 28 mEq/L (ref 19–32)
Calcium: 9.5 mg/dL (ref 8.4–10.5)
Chloride: 105 mEq/L (ref 96–112)
Creat: 0.83 mg/dL (ref 0.50–1.10)
Glucose, Bld: 84 mg/dL (ref 70–99)
Potassium: 3.8 mEq/L (ref 3.5–5.3)
Sodium: 140 mEq/L (ref 135–145)

## 2012-11-21 ENCOUNTER — Encounter (HOSPITAL_COMMUNITY): Payer: Self-pay | Admitting: Emergency Medicine

## 2012-11-21 ENCOUNTER — Emergency Department (INDEPENDENT_AMBULATORY_CARE_PROVIDER_SITE_OTHER): Payer: Medicare PPO

## 2012-11-21 ENCOUNTER — Emergency Department (HOSPITAL_COMMUNITY)
Admission: EM | Admit: 2012-11-21 | Discharge: 2012-11-21 | Disposition: A | Discharge status: Home / Self Care | Payer: Medicare PPO | Source: Home / Self Care

## 2012-11-21 DIAGNOSIS — S9030XA Contusion of unspecified foot, initial encounter: Secondary | ICD-10-CM

## 2012-11-21 DIAGNOSIS — S9031XA Contusion of right foot, initial encounter: Secondary | ICD-10-CM

## 2012-11-21 NOTE — ED Notes (Signed)
States she accidentally hit her foot Sunday, and since hen has had swelling, pain; NAD

## 2012-11-21 NOTE — ED Provider Notes (Signed)
CSN: [id number]     Arrival date & time 11/21/12  4098 History   First MD Initiated Contact with Patient 11/21/12 [phone]     Chief Complaint  Patient presents with  . Foot Pain   (Consider location/radiation/quality/duration/timing/severity/associated sxs/prior Treatment) HPI Comments: 67 year old female states she bumped her right foot on an object 4 days ago. She is complaining of increasing pain to the medial aspect of the foot with tenderness and swelling over the  Dorsum and medial aspect.   Past Medical History  Diagnosis Date  . Hypertension 1985    Never successfully treated.  . Hyperlipidemia 11/04/2010  . Abnormal electrocardiogram 02/03/2010    Sinus bradycardia  . Medication intolerance    Past Surgical History  Procedure Laterality Date  . Tubal ligation  1977  . Cataract extraction  2011    Bilateral  . Colonoscopy    . Colonoscopy  09/20/2011    Procedure: COLONOSCOPY;  Surgeon: Malissa Hippo, MD;  Location: [facility];  Service: Endoscopy;  Laterality: N/A;  930  . Eye surgery Right 11/2010    cataract extraction  . Eye surgery Left 01/2011    cataract extraction   Family History  Problem Relation Age of Onset  . COPD Mother   . Diabetes Mother   . Pneumonia Father   . Hypertension Sister   . Diabetes Brother   . Alcohol abuse Father    History  Substance Use Topics  . Smoking status: Never Smoker   . Smokeless tobacco: Never Used  . Alcohol Use: No   OB History   Grav Para Term Preterm Abortions TAB SAB Ect Mult Living                 Review of Systems  Constitutional: Negative for fever, chills and activity change.  HENT: Negative.   Respiratory: Negative.   Cardiovascular: Negative.   Musculoskeletal:       As per HPI  Skin: Negative for color change, pallor and rash.  Neurological: Negative.     Allergies  Amlodipine; Hydrochlorothiazide w-triamterene; and Lisinopril  Home Medications   Current Outpatient Rx  Name  Route  Sig   Dispense  Refill  . amLODipine (NORVASC) 5 MG tablet   Oral   Take 1 tablet (5 mg total) by mouth daily.   30 tablet   11     Dose increase effective 09/27/2012   . aspirin (ASPIRIN LOW DOSE) 81 MG EC tablet   Oral   Take 81 mg by mouth daily.           . calcium-vitamin D (OSCAL 500/200 D-3) 500-200 MG-UNIT per tablet   Oral   Take 1 tablet by mouth 3 (three) times daily.           . hydrochlorothiazide (HYDRODIURIL) 25 MG tablet      One tablet every morning at 8am for high blood pressure   30 tablet   4   . Multiple Vitamins-Minerals (WOMENS MULTIVITAMIN PLUS) TABS   Oral   Take by mouth daily.           . potassium chloride SA (K-DUR,KLOR-CON) 20 MEQ tablet   Oral   Take 1 tablet (20 mEq total) by mouth 2 (two) times daily.   60 tablet   2    BP 154/93  Pulse 90  Temp(Src) 97.3 F (36.3 C) (Oral)  Resp 18  SpO2 96% Physical Exam  Nursing note and vitals reviewed. Constitutional: She is oriented  to person, place, and time. She appears well-developed and well-nourished. No distress.  HENT:  Head: Normocephalic and atraumatic.  Eyes: EOM are normal.  Neck: Neck supple.  Pulmonary/Chest: Effort normal. No respiratory distress.  Musculoskeletal:  Tenderness along the medial aspect of the right foot. Swelling and tenderness over the medial half of the dorsum. Dorsiflexion approximately 50% of full range of motion, plantar flexion 100%. Pedal pulse 2+. No ankle pain swelling or tenderness. No erythema or increased warmth. Distal neurovascular and motor sensory intact.  Neurological: She is alert and oriented to person, place, and time. No cranial nerve deficit.  Skin: Skin is warm and dry.  Psychiatric: She has a normal mood and affect.    ED Course  Procedures (including critical care time) Labs Review Labs Reviewed - No data to display Imaging Review Dg Foot Complete Right  11/21/2012   CLINICAL DATA:  Right foot pain for 4 days since an injury.   EXAM: RIGHT FOOT COMPLETE - 3+ VIEW  COMPARISON:  None.  FINDINGS: No acute bony or joint abnormality is identified. Small plantar calcaneal spur is noted. Soft tissue structures are unremarkable.  IMPRESSION: No acute finding.   Electronically Signed   By: Drusilla Kanner M.D.   On: 11/21/2012 10:04      MDM   1. Foot contusion, right, initial encounter      No signs of infection or gout.  Xray neg. ACE wrap for 3-4 days. If not improving see your PCP If worse may return.  Hayden Rasmussen, NP 11/21/12 1021

## 2012-11-23 NOTE — ED Provider Notes (Signed)
Medical screening examination/treatment/procedure(s) were performed by a resident physician or non-physician practitioner and as the supervising physician I was immediately available for consultation/collaboration.  Evan Corey, MD    Evan S Corey, MD 11/23/12 0838 

## 2012-11-27 ENCOUNTER — Encounter: Payer: Self-pay | Admitting: Family Medicine

## 2012-11-27 ENCOUNTER — Ambulatory Visit (INDEPENDENT_AMBULATORY_CARE_PROVIDER_SITE_OTHER): Payer: Medicare PPO | Admitting: Family Medicine

## 2012-11-27 VITALS — BP 190/100 | HR 100 | Resp 18 | Ht 66.0 in | Wt 164.0 lb

## 2012-11-27 DIAGNOSIS — R7309 Other abnormal glucose: Secondary | ICD-10-CM

## 2012-11-27 DIAGNOSIS — I10 Essential (primary) hypertension: Secondary | ICD-10-CM

## 2012-11-27 DIAGNOSIS — R7303 Prediabetes: Secondary | ICD-10-CM

## 2012-11-27 DIAGNOSIS — G8929 Other chronic pain: Secondary | ICD-10-CM

## 2012-11-27 DIAGNOSIS — M79609 Pain in unspecified limb: Secondary | ICD-10-CM

## 2012-11-27 DIAGNOSIS — M79672 Pain in left foot: Secondary | ICD-10-CM | POA: Insufficient documentation

## 2012-11-27 DIAGNOSIS — E785 Hyperlipidemia, unspecified: Secondary | ICD-10-CM

## 2012-11-27 DIAGNOSIS — Z9119 Patient's noncompliance with other medical treatment and regimen: Secondary | ICD-10-CM

## 2012-11-27 NOTE — Progress Notes (Signed)
   Subjective:    Patient ID: Sara Haynes, female    DOB: 03/27/1945, 67 y.o.   MRN: [id number]  HPI Pt in for ED f/o right foot following direct trauma approximately 2 weeks ago. She was initially evaluated in the ED, no fracture seen on Xray, states pain and swelling have improved a little, but still experiencing pain rated at an 8 No h/o fever, redness or drainage from the foot, no skin breakdown Again sttaes she has not been taking her bP meds reguarlly, stating that she takes meds prescribed only if her home BP reading is high, states that she "feels well", and when she checks her BP it is 130/80 or less Review of Systems See HPI  Denies recent fever or chills. Denies sinus pressure, nasal congestion, ear pain or sore throat. Denies chest congestion, productive cough or wheezing. Denies chest pains, palpitations and leg swelling Denies abdominal pain, nausea, vomiting,diarrhea or constipation.   Denies dysuria, frequency, hesitancy or incontinence.  Denies headaches, seizures, numbness, or tingling. Denies depression, anxiety or insomnia. Denies skin break down or rash.        Objective:   Physical Exam  Patient alert and oriented and in no cardiopulmonary distress.  HEENT: No facial asymmetry, EOMI, no sinus tenderness,  oropharynx pink and moist.  Neck supple no adenopathy.  Chest: Clear to auscultation bilaterally.  CVS: S1, S2 no murmurs, no S3.  ABD: Soft non tender. Bowel sounds normal.  Ext: No edema  MS: Adequate ROM spine, shoulders, hips and knees.Decreased ROM and swelling with tenderness of d right foot  Skin: Intact, no ulcerations or rash noted.  Psych: Good eye contact, normal affect. Memory intact not anxious or depressed appearing.  CNS: CN 2-12 intact, power, tone and sensation normal throughout.       Assessment & Plan:

## 2012-11-27 NOTE — Patient Instructions (Addendum)
Pelvic and breast  in 4 month, call if you need me before  You are referred to dr Hilda Lias  Pls take the other 2 BP meds in here before you leave, we will give you water

## 2012-11-29 DIAGNOSIS — Z9119 Patient's noncompliance with other medical treatment and regimen: Secondary | ICD-10-CM | POA: Insufficient documentation

## 2012-11-29 DIAGNOSIS — Z91199 Patient's noncompliance with other medical treatment and regimen due to unspecified reason: Secondary | ICD-10-CM | POA: Insufficient documentation

## 2012-11-29 NOTE — Assessment & Plan Note (Signed)
2 week h/o pain and swelling of right foot following trauma. Negative Xray however , significant pain and swelling remain, needs ortho eval asap

## 2012-11-29 NOTE — Assessment & Plan Note (Signed)
Marked improvement with lifestyle change, hopefully this will continue

## 2012-11-29 NOTE — Assessment & Plan Note (Signed)
Hyperlipidemia:Low fat diet discussed and encouraged.   

## 2012-11-29 NOTE — Assessment & Plan Note (Signed)
Longstanding h/o unconontrolled stage 2 HTN , py maintains intolerance of medications and repeatedly presents with markedly elevated BP, despite re education both here and also when sen in cardiology

## 2012-11-29 NOTE — Assessment & Plan Note (Signed)
Uncontrolled due to med non compliance. Pt took her medication under supervision in the office at  Time of visit. Importance of compliance and risks of ongoing noncompliance including stroke, kidney failure , heart failure were again discussed

## 2013-01-11 ENCOUNTER — Other Ambulatory Visit: Payer: Self-pay | Admitting: Family Medicine

## 2013-03-18 ENCOUNTER — Encounter: Payer: Self-pay | Admitting: Family Medicine

## 2013-03-18 ENCOUNTER — Ambulatory Visit (INDEPENDENT_AMBULATORY_CARE_PROVIDER_SITE_OTHER): Payer: Medicare PPO | Admitting: Family Medicine

## 2013-03-18 ENCOUNTER — Encounter (INDEPENDENT_AMBULATORY_CARE_PROVIDER_SITE_OTHER): Payer: Self-pay

## 2013-03-18 ENCOUNTER — Other Ambulatory Visit (HOSPITAL_COMMUNITY)
Admission: RE | Admit: 2013-03-18 | Discharge: 2013-03-18 | Disposition: A | Discharge status: Home / Self Care | Payer: Medicare PPO | Source: Ambulatory Visit | Attending: Family Medicine | Admitting: Family Medicine

## 2013-03-18 VITALS — BP 148/92 | HR 98 | Resp 16 | Wt 155.4 lb

## 2013-03-18 DIAGNOSIS — Z124 Encounter for screening for malignant neoplasm of cervix: Secondary | ICD-10-CM

## 2013-03-18 DIAGNOSIS — Z1211 Encounter for screening for malignant neoplasm of colon: Secondary | ICD-10-CM

## 2013-03-18 DIAGNOSIS — Z0000 Encounter for general adult medical examination without abnormal findings: Secondary | ICD-10-CM

## 2013-03-18 DIAGNOSIS — N840 Polyp of corpus uteri: Secondary | ICD-10-CM | POA: Insufficient documentation

## 2013-03-18 LAB — POC HEMOCCULT BLD/STL (OFFICE/1-CARD/DIAGNOSTIC): Fecal Occult Blood, POC: NEGATIVE

## 2013-03-18 MED ORDER — HYDROCHLOROTHIAZIDE 25 MG PO TABS: ORAL_TABLET | ORAL | Status: DC

## 2013-03-18 NOTE — Progress Notes (Signed)
   Subjective:    Patient ID: Sara Haynes, female    DOB: August 02, 1945, 68 y.o.   MRN: [id number]  HPI Pt  in for complete physical exam. She has no heal;th concerns which are of concern to be addressed. Immunization and cancer screening needs are reviewed and addressed.   Review of Systems See HPI     Objective:   Physical Exam  Pleasant well nourished female, alert and oriented x 3, in no cardio-pulmonary distress. Afebrile. HEENT No facial trauma or asymetry. Sinuses non tender.  EOMI, PERTL, fundoscopic exam  no hemorhage or exudate.  External ears normal, tympanic membranes clear. Oropharynx moist, no exudate, good dentition. Neck: supple, no adenopathy,JVD or thyromegaly.No bruits.  Chest: Clear to ascultation bilaterally.No crackles or wheezes. Non tender to palpation  Breast: No asymetry,no masses or lumps. No tenderness. No nipple discharge or inversion. No axillary or supraclavicular adenopathy  Cardiovascular system; Heart sounds normal,  S1 and  S2 ,no S3.  No murmur, or thrill.Regular rate Apical beat not displaced Peripheral pulses normal.  Abdomen: Soft, non tender, no organomegaly or masses. No bruits. Bowel sounds normal. No guarding, tenderness or rebound.  Rectal:  Normal sphincter tone. No mass.No rectal masses.  Guaiac negative stool.  GU: External genitalia normal female genitalia , female distribution of hair. No lesions. Urethral meatus normal in size, no  Prolapse, no lesions visibly  Present. Bladder non tender. Vagina pink and moist , with no visible lesions , discharge present . Adequate pelvic support no  cystocele or rectocele noted Cervix pink and appears healthy, no lesions or ulcerations noted, no discharge noted from os Uterus enlarged 8 week size,no cervical motion or adnexal tenderness.   Musculoskeletal exam: Full ROM of spine, hips , shoulders and knees. No deformity ,swelling or crepitus noted. No muscle  wasting or atrophy.   Neurologic: Cranial nerves 2 to 12 intact. Power, tone ,sensation and reflexes normal throughout. No disturbance in gait. No tremor.  Skin: Intact, no ulceration, erythema , scaling or rash noted. Pigmentation normal throughout  Psych; Normal mood and affect. Judgement and concentration normal       Assessment & Plan:  Routine general medical examination at a health care facility Annual physical  exam as documented. Counseling done  re healthy lifestyle involving commitment to 150 minutes exercise per week, heart healthy diet, and attaining healthy weight.The importance of adequate sleep also discussed. Regular seat belt use  is also discussed. Changes in health habits are decided on by the patient with goals and time frames  set for achieving them. Immunization and cancer screening needs are specifically addressed at this visit. Fall precautions also discussed,, and information provided

## 2013-03-18 NOTE — Patient Instructions (Signed)
Annual wellness in 4.5 month, call if you need me before  Congrats on excellent blood pressure, continue all medication you are taking   Labs today  You will be referred for a pelvic US as your womb feels enlarged  Fall Prevention and Home Safety Falls cause injuries and can affect all age groups. It is possible to prevent falls.  HOW TO PREVENT FALLS  Wear shoes with rubber soles that do not have an opening for your toes.  Keep the inside and outside of your house well lit.  Use night lights throughout your home.  Remove clutter from floors.  Clean up floor spills.  Remove throw rugs or fasten them to the floor with carpet tape.  Do not place electrical cords across pathways.  Put grab bars by your tub, shower, and toilet. Do not use towel bars as grab bars.  Put handrails on both sides of the stairway. Fix loose handrails.  Do not climb on stools or stepladders, if possible.  Do not wax your floors.  Repair uneven or unsafe sidewalks, walkways, or stairs.  Keep items you use a lot within reach.  Be aware of pets.  Keep emergency numbers next to the telephone.  Put smoke detectors in your home and near bedrooms. Ask your doctor what other things you can do to prevent falls. Document Released: 10/15/2008 Document Revised: 06/20/2011 Document Reviewed: 03/21/2011 [facility] Patient Information 2014 [place], [place].

## 2013-03-18 NOTE — Assessment & Plan Note (Addendum)
Annual physical  exam as documented. Counseling done  re healthy lifestyle involving commitment to 150 minutes exercise per week, heart healthy diet, and attaining healthy weight.The importance of adequate sleep also discussed. Regular seat belt use  is also discussed. Changes in health habits are decided on by the patient with goals and time frames  set for achieving them. Immunization and cancer screening needs are specifically addressed at this visit. Fall precautions also discussed,, and information provided

## 2013-03-22 LAB — BASIC METABOLIC PANEL
BUN: 11 mg/dL (ref 6–23)
CHLORIDE: 107 meq/L (ref 96–112)
CO2: 27 mEq/L (ref 19–32)
Calcium: 9.3 mg/dL (ref 8.4–10.5)
Creat: 0.75 mg/dL (ref 0.50–1.10)
GLUCOSE: 95 mg/dL (ref 70–99)
POTASSIUM: 4.1 meq/L (ref 3.5–5.3)
SODIUM: 141 meq/L (ref 135–145)

## 2013-03-22 LAB — LIPID PANEL
CHOLESTEROL: 149 mg/dL (ref 0–200)
HDL: 39 mg/dL — AB (ref >39)
LDL Cholesterol: 97 mg/dL (ref 0–99)
Total CHOL/HDL Ratio: 3.8 Ratio
Triglycerides: 66 mg/dL (ref <150)
VLDL: 13 mg/dL (ref 0–40)

## 2013-03-22 LAB — CBC WITH DIFFERENTIAL/PLATELET
Basophils Absolute: 0 10*3/uL (ref 0.0–0.1)
Basophils Relative: 0 % (ref 0–1)
Eosinophils Absolute: 0.1 10*3/uL (ref 0.0–0.7)
Eosinophils Relative: 2 % (ref 0–5)
HCT: 31.6 % — ABNORMAL LOW (ref 36.0–46.0)
HEMOGLOBIN: 10.6 g/dL — AB (ref 12.0–15.0)
Lymphocytes Relative: 62 % — ABNORMAL HIGH (ref 12–46)
Lymphs Abs: 1.9 10*3/uL (ref 0.7–4.0)
MCH: 29.6 pg (ref 26.0–34.0)
MCHC: 33.5 g/dL (ref 30.0–36.0)
MCV: 88.3 fL (ref 78.0–100.0)
MONOS PCT: 8 % (ref 3–12)
Monocytes Absolute: 0.2 10*3/uL (ref 0.1–1.0)
NEUTROS ABS: 0.9 10*3/uL — AB (ref 1.7–7.7)
Neutrophils Relative %: 28 % — ABNORMAL LOW (ref 43–77)
Platelets: 173 10*3/uL (ref 150–400)
RBC: 3.58 MIL/uL — ABNORMAL LOW (ref 3.87–5.11)
RDW: 14.4 % (ref 11.5–15.5)
WBC: 3.1 10*3/uL — ABNORMAL LOW (ref 4.0–10.5)

## 2013-03-22 LAB — FERRITIN: Ferritin: 222 ng/mL (ref 10–291)

## 2013-03-22 LAB — IRON: IRON: 100 ug/dL (ref 42–145)

## 2013-03-22 LAB — MAGNESIUM: MAGNESIUM: 1.6 mg/dL (ref 1.5–2.5)

## 2013-03-23 LAB — HEMOGLOBIN A1C
Hgb A1c MFr Bld: 5.8 % — ABNORMAL HIGH (ref <5.7)
Mean Plasma Glucose: 120 mg/dL — ABNORMAL HIGH (ref <117)

## 2013-03-24 LAB — PATHOLOGIST SMEAR REVIEW

## 2013-03-25 ENCOUNTER — Other Ambulatory Visit (HOSPITAL_COMMUNITY): Payer: Medicare PPO

## 2013-03-25 ENCOUNTER — Ambulatory Visit (HOSPITAL_COMMUNITY): Payer: Medicare PPO

## 2013-03-25 ENCOUNTER — Ambulatory Visit (HOSPITAL_COMMUNITY)
Admission: RE | Admit: 2013-03-25 | Discharge: 2013-03-25 | Disposition: A | Discharge status: Home / Self Care | Payer: Medicare PPO | Source: Ambulatory Visit | Attending: Family Medicine | Admitting: Family Medicine

## 2013-03-25 DIAGNOSIS — R1900 Intra-abdominal and pelvic swelling, mass and lump, unspecified site: Secondary | ICD-10-CM | POA: Insufficient documentation

## 2013-03-25 DIAGNOSIS — N859 Noninflammatory disorder of uterus, unspecified: Secondary | ICD-10-CM | POA: Insufficient documentation

## 2013-03-26 ENCOUNTER — Ambulatory Visit (HOSPITAL_COMMUNITY)
Admission: RE | Admit: 2013-03-26 | Discharge: 2013-03-26 | Disposition: A | Discharge status: Home / Self Care | Payer: Medicare PPO | Source: Ambulatory Visit | Attending: Family Medicine | Admitting: Family Medicine

## 2013-03-26 DIAGNOSIS — Z780 Asymptomatic menopausal state: Secondary | ICD-10-CM | POA: Insufficient documentation

## 2013-03-26 DIAGNOSIS — Z13820 Encounter for screening for osteoporosis: Secondary | ICD-10-CM

## 2013-03-28 ENCOUNTER — Other Ambulatory Visit: Payer: Self-pay

## 2013-03-28 DIAGNOSIS — R1900 Intra-abdominal and pelvic swelling, mass and lump, unspecified site: Secondary | ICD-10-CM

## 2013-04-17 ENCOUNTER — Encounter: Payer: Self-pay | Admitting: Obstetrics and Gynecology

## 2013-04-17 ENCOUNTER — Ambulatory Visit (INDEPENDENT_AMBULATORY_CARE_PROVIDER_SITE_OTHER): Payer: Medicare PPO | Admitting: Obstetrics and Gynecology

## 2013-04-17 VITALS — BP 196/110 | Ht 66.0 in | Wt 161.8 lb

## 2013-04-17 DIAGNOSIS — N840 Polyp of corpus uteri: Secondary | ICD-10-CM

## 2013-04-17 NOTE — Progress Notes (Signed)
Seen today, is reluctant to agree to removal of polyp, will agree to followup  Visit, giveninfo on endometrial biopsy in office before considering hysteroscopy.

## 2013-04-17 NOTE — Progress Notes (Signed)
This chart was scribed by Ludger Nutting, Medical Scribe, for Dr. Mallory Shirk on 04/17/13 at 3:16 PM. This chart was reviewed by Dr. Mallory Shirk and is accurate.   [facility] Visit  Patient name: Sara Haynes MRN [id number]  Date of birth: 1945-07-17  CC & HPI:  Sara Haynes is a 68 y.o. female presenting today for follow up to discuss ultrasound ordered by Dr. Griffin Dakin office. She denies any symptoms or complaints at this time.  U/s showed a lower uterine segment polyp. ROS:  Negative except listed above.   Pertinent History Reviewed:  Medical & Surgical Hx:  Reviewed: Significant for  Past Medical History  Diagnosis Date  . Hypertension 1985    Never successfully treated.  . Hyperlipidemia 11/04/2010  . Abnormal electrocardiogram 02/03/2010    Sinus bradycardia  . Medication intolerance     Past Surgical History  Procedure Laterality Date  . Tubal ligation  1977  . Cataract extraction  2011    Bilateral  . Colonoscopy    . Colonoscopy  09/20/2011    Procedure: COLONOSCOPY;  Surgeon: Rogene Houston, MD;  Location: [facility];  Service: Endoscopy;  Laterality: N/A;  930  . Eye surgery Right 11/2010    cataract extraction  . Eye surgery Left 01/2011    cataract extraction    Medications: Reviewed & Updated - see associated section Social History: Reviewed -  reports that she has never smoked. She has never used smokeless tobacco.  Objective Findings:  Vitals: BP 196/110  Ht 5\' 6"  (1.676 m)  Wt 161 lb 12.8 oz (73.392 kg)  BMI 26.13 kg/m2  Physical Examination: General appearance - alert, well appearing, and in no distress and oriented to person, place, and time Pt declines to proceed with exam today.  Assessment & Plan:   Given info on hysteroscopy.removal of polyp. Pt unwilling to have  Examination today or schedule surgery. Will attempt endometrial biopsy in office.

## 2013-04-17 NOTE — Patient Instructions (Addendum)
Endometrial Biopsy Endometrial biopsy is a procedure in which a tissue sample is taken from inside the uterus. The tissue sample is then looked at under a microscope to see if the tissue is normal or abnormal. The endometrium is the lining of the uterus. This procedure helps determine where you are in your menstrual cycle and how hormone levels are affecting the lining of the uterus. This procedure may also be used to evaluate uterine bleeding or to diagnose endometrial cancer, tuberculosis, polyps, or inflammatory conditions.  LET [facility] CARE PROVIDER KNOW ABOUT:  Any allergies you have.  All medicines you are taking, including vitamins, herbs, eye drops, creams, and over-the-counter medicines.  Previous problems you or members of your family have had with the use of anesthetics.  Any blood disorders you have.  Previous surgeries you have had.  Medical conditions you have.  Possibility of pregnancy. RISKS AND COMPLICATIONS Generally, this is a safe procedure. However, as with any procedure, complications can occur. Possible complications include:  Bleeding.  Pelvic infection.  Puncture of the uterine wall with the biopsy device (rare). BEFORE THE PROCEDURE   Keep a record of your menstrual cycles as directed by your health care provider. You may need to schedule your procedure for a specific time in your cycle.  You may want to bring a sanitary pad to wear home after the procedure.  Arrange for someone to drive you home after the procedure if you will be given a medicine to help you relax (sedative). PROCEDURE   You may be given a sedative to relax you.  You will lie on an exam table with your feet and legs supported as in a pelvic exam.  Your health care provider will insert an instrument (speculum) into your vagina to see your cervix.  Your cervix will be cleansed with an antiseptic solution. A medicine (local anesthetic) will be used to numb the cervix.  A forceps  instrument (tenaculum) will be used to hold your cervix steady for the biopsy.  A thin, rodlike instrument (uterine sound) will be inserted through your cervix to determine the length of your uterus and the location where the biopsy sample will be removed.  A thin, flexible tube (catheter) will be inserted through your cervix and into the uterus. The catheter is used to collect the biopsy sample from your endometrial tissue.  The catheter and speculum will then be removed, and the tissue sample will be sent to a lab for examination. AFTER THE PROCEDURE  You will rest in a recovery area until you are ready to go home.  You may have mild cramping and a small amount of vaginal bleeding for a few days after the procedure. This is normal.  Make sure you find out how to get your test results. Document Released: 04/21/2004 Document Revised: 08/21/2012 Document Reviewed: 06/05/2012 [facility] Patient Information 2014 [place], [place].   Keep appt in 2 weeks for endomet biopsy(Monday)

## 2013-04-28 ENCOUNTER — Other Ambulatory Visit: Payer: Self-pay | Admitting: Obstetrics and Gynecology

## 2013-04-28 ENCOUNTER — Encounter: Payer: Self-pay | Admitting: Obstetrics and Gynecology

## 2013-04-28 ENCOUNTER — Ambulatory Visit (INDEPENDENT_AMBULATORY_CARE_PROVIDER_SITE_OTHER): Payer: Medicare PPO | Admitting: Obstetrics and Gynecology

## 2013-04-28 VITALS — BP 170/94 | Ht 66.0 in | Wt 163.0 lb

## 2013-04-28 DIAGNOSIS — N840 Polyp of corpus uteri: Secondary | ICD-10-CM | POA: Insufficient documentation

## 2013-04-28 DIAGNOSIS — N814 Uterovaginal prolapse, unspecified: Secondary | ICD-10-CM | POA: Insufficient documentation

## 2013-04-28 DIAGNOSIS — Z01818 Encounter for other preprocedural examination: Secondary | ICD-10-CM

## 2013-04-28 NOTE — Progress Notes (Signed)
This chart was scribed by Ludger Nutting, Medical Scribe, for Dr. Mallory Shirk on 04/28/13 at 10:31 AM. This chart was reviewed by Dr. Mallory Shirk and is accurate.   ROS: + mild urgency and incontinence +right sided vulvar swelling  Pelvic Exam: Cystocele and 2nd degree uterine descensus      Endometrial Biopsy: Patient given informed consent, signed copy in the chart, time out was performed. Time out taken. . The patient was placed in the lithotomy position and the cervix brought into view with sterile speculum.  Portion of cervix cleansed x 2 with betadine swabs.  A tenaculum was placed in the anterior lip of the cervix. The uterus was sounded for depth of 8 cm,. Milex uterine Explora 3 mm was introduced to into the uterus, suction created,  and an endometrial sample was obtained. All equipment was removed and accounted for.   The patient tolerated the procedure well.    Patient given post procedure instructions.  Followup: by phone for results   Plan:  1. Follow up by phone for results  2. schedule hysteroscopy D&C and removal of polyp in 1-3 weeks

## 2013-04-28 NOTE — Patient Instructions (Signed)
Endometrial Biopsy, Care After Refer to this sheet in the next few weeks. These instructions provide you with information on caring for yourself after your procedure. Your health care provider may also give you more specific instructions. Your treatment has been planned according to current medical practices, but problems sometimes occur. Call your health care provider if you have any problems or questions after your procedure. WHAT TO EXPECT AFTER THE PROCEDURE After your procedure, it is typical to have the following:  You may have mild cramping and a small amount of vaginal bleeding for a few days after the procedure. This is normal. HOME CARE INSTRUCTIONS  Only take over-the-counter or prescription medicine as directed by your health care provider.  Do not douche, use tampons, or have sexual intercourse until your health care provider approves.  Follow your health care provider's instructions regarding any activity restrictions, such as strenuous exercise or heavy lifting. SEEK MEDICAL CARE IF:  You have heavy bleeding or bleeding longer than 2 days after the procedure.  You have bad smelling drainage from your vagina.  You have a fever and chills.  Youhave severe lower stomach (abdominal) pain. SEEK IMMEDIATE MEDICAL CARE IF:  You have severe cramps in your stomach or back.  You pass large blood clots.  Your bleeding increases.  You become weak or lightheaded, or you pass out. Document Released: 10/09/2012 Document Reviewed: 06/05/2012 [employer] Patient Information 2014 [employer].  

## 2013-04-29 ENCOUNTER — Encounter (HOSPITAL_COMMUNITY): Payer: Medicare PPO | Admitting: Pharmacy Technician

## 2013-05-07 ENCOUNTER — Encounter (HOSPITAL_COMMUNITY): Payer: Self-pay

## 2013-05-07 ENCOUNTER — Encounter (HOSPITAL_COMMUNITY)
Admission: RE | Admit: 2013-05-07 | Discharge: 2013-05-07 | Disposition: A | Discharge status: Home / Self Care | Payer: Medicare PPO | Source: Ambulatory Visit | Attending: Obstetrics and Gynecology | Admitting: Obstetrics and Gynecology

## 2013-05-07 ENCOUNTER — Other Ambulatory Visit: Payer: Self-pay | Admitting: Obstetrics and Gynecology

## 2013-05-07 ENCOUNTER — Other Ambulatory Visit: Payer: Self-pay

## 2013-05-07 DIAGNOSIS — Z01810 Encounter for preprocedural cardiovascular examination: Secondary | ICD-10-CM | POA: Insufficient documentation

## 2013-05-07 DIAGNOSIS — Z01812 Encounter for preprocedural laboratory examination: Secondary | ICD-10-CM | POA: Insufficient documentation

## 2013-05-07 HISTORY — DX: Tinnitus, bilateral: H93.13

## 2013-05-07 HISTORY — DX: Anemia, unspecified: D64.9

## 2013-05-07 LAB — URINE MICROSCOPIC-ADD ON

## 2013-05-07 LAB — CBC
HCT: 32.3 % — ABNORMAL LOW (ref 36.0–46.0)
HEMOGLOBIN: 10.7 g/dL — AB (ref 12.0–15.0)
MCH: 30.1 pg (ref 26.0–34.0)
MCHC: 33.1 g/dL (ref 30.0–36.0)
MCV: 91 fL (ref 78.0–100.0)
Platelets: 188 10*3/uL (ref 150–400)
RBC: 3.55 MIL/uL — AB (ref 3.87–5.11)
RDW: 13.9 % (ref 11.5–15.5)
WBC: 4.4 10*3/uL (ref 4.0–10.5)

## 2013-05-07 LAB — URINALYSIS, ROUTINE W REFLEX MICROSCOPIC
Bilirubin Urine: NEGATIVE
Glucose, UA: NEGATIVE mg/dL
KETONES UR: NEGATIVE mg/dL
NITRITE: NEGATIVE
PH: 5.5 (ref 5.0–8.0)
Protein, ur: NEGATIVE mg/dL
Specific Gravity, Urine: 1.02 (ref 1.005–1.030)
Urobilinogen, UA: 0.2 mg/dL (ref 0.0–1.0)

## 2013-05-07 LAB — BASIC METABOLIC PANEL
BUN: 15 mg/dL (ref 6–23)
CALCIUM: 9.5 mg/dL (ref 8.4–10.5)
CO2: 29 mEq/L (ref 19–32)
Chloride: 105 mEq/L (ref 96–112)
Creatinine, Ser: 0.87 mg/dL (ref 0.50–1.10)
GFR, EST AFRICAN AMERICAN: 78 mL/min — AB (ref >90)
GFR, EST NON AFRICAN AMERICAN: 67 mL/min — AB (ref >90)
Glucose, Bld: 93 mg/dL (ref 70–99)
POTASSIUM: 4.2 meq/L (ref 3.7–5.3)
SODIUM: 143 meq/L (ref 137–147)

## 2013-05-07 NOTE — Pre-Procedure Instructions (Signed)
Dr Gonzalez aware of H&H. No orders given. 

## 2013-05-07 NOTE — Pre-Procedure Instructions (Signed)
Patient given information to sign up for my chart at home. 

## 2013-05-07 NOTE — Patient Instructions (Addendum)
Sara Haynes  05/07/2013   Your procedure is scheduled on:   05/13/2013  Report to [facility] at  20  AM.  Call this number if you have problems the morning of surgery: [phone]   Remember:   Do not eat food or drink liquids after midnight.   Take these medicines the morning of surgery with A SIP OF WATER: amlodipine   Do not wear jewelry, make-up or nail polish.  Do not wear lotions, powders, or perfumes.   Do not shave 48 hours prior to surgery. Men may shave face and neck.  Do not bring valuables to the hospital.  [facility] is not responsible for any belongings or valuables.               Contacts, dentures or bridgework may not be worn into surgery.  Leave suitcase in the car. After surgery it may be brought to your room.  For patients admitted to the hospital, discharge time is determined by your  treatment team.               Patients discharged the day of surgery will not be allowed to drive home.  Name and phone number of your driver: family  Special Instructions: Shower using CHG 2 nights before surgery and the night before surgery.  If you shower the day of surgery use CHG.  Use special wash - you have one bottle of CHG for all showers.  You should use approximately 1/3 of the bottle for each shower.   Please read over the following fact sheets that you were given: Pain Booklet, Coughing and Deep Breathing, Surgical Site Infection Prevention, Anesthesia Post-op Instructions and Care and Recovery After Surgery Hysteroscopy Hysteroscopy is a procedure used for looking inside the womb (uterus). It may be done for various reasons, including:  To evaluate abnormal bleeding, fibroid (benign, noncancerous) tumors, polyps, scar tissue (adhesions), and possibly cancer of the uterus.  To look for lumps (tumors) and other uterine growths.  To look for causes of why a woman cannot get pregnant (infertility), causes of recurrent loss of pregnancy (miscarriages), or a  lost intrauterine device (IUD).  To perform a sterilization by blocking the fallopian tubes from inside the uterus. In this procedure, a thin, flexible tube with a tiny light and camera on the end of it (hysteroscope) is used to look inside the uterus. A hysteroscopy should be done right after a menstrual period to be sure you are not pregnant. LET [facility] CARE PROVIDER KNOW ABOUT:   Any allergies you have.  All medicines you are taking, including vitamins, herbs, eye drops, creams, and over-the-counter medicines.  Previous problems you or members of your family have had with the use of anesthetics.  Any blood disorders you have.  Previous surgeries you have had.  Medical conditions you have. RISKS AND COMPLICATIONS  Generally, this is a safe procedure. However, as with any procedure, complications can occur. Possible complications include:  Putting a hole in the uterus.  Excessive bleeding.  Infection.  Damage to the cervix.  Injury to other organs.  Allergic reaction to medicines.  Too much fluid used in the uterus for the procedure. BEFORE THE PROCEDURE   Ask your health care provider about changing or stopping any regular medicines.  Do not take aspirin or blood thinners for 1 week before the procedure, or as directed by your health care provider. These can cause bleeding.  If you smoke, do not  smoke for 2 weeks before the procedure.  In some cases, a medicine is placed in the cervix the day before the procedure. This medicine makes the cervix have a larger opening (dilate). This makes it easier for the instrument to be inserted into the uterus during the procedure.  Do not eat or drink anything for at least 8 hours before the surgery.  Arrange for someone to take you home after the procedure. PROCEDURE   You may be given a medicine to relax you (sedative). You may also be given one of the following:  A medicine that numbs the area around the cervix (local  anesthetic).  A medicine that makes you sleep through the procedure (general anesthetic).  The hysteroscope is inserted through the vagina into the uterus. The camera on the hysteroscope sends a picture to a TV screen. This gives the surgeon a good view inside the uterus.  During the procedure, air or a liquid is put into the uterus, which allows the surgeon to see better.  Sometimes, tissue is gently scraped from inside the uterus. These tissue samples are sent to a lab for testing. AFTER THE PROCEDURE   If you had a general anesthetic, you may be groggy for a couple hours after the procedure.  If you had a local anesthetic, you will be able to go home as soon as you are stable and feel ready.  You may have some cramping. This normally lasts for a couple days.  You may have bleeding, which varies from light spotting for a few days to menstrual-like bleeding for 3 7 days. This is normal.  If your test results are not back during the visit, make an appointment with your health care provider to find out the results. Document Released: 03/27/2000 Document Revised: 10/09/2012 Document Reviewed: 07/18/2012 [facility] Patient Information 2014 [place], [place]. PATIENT INSTRUCTIONS POST-ANESTHESIA  IMMEDIATELY FOLLOWING SURGERY:  Do not drive or operate machinery for the first twenty four hours after surgery.  Do not make any important decisions for twenty four hours after surgery or while taking narcotic pain medications or sedatives.  If you develop intractable nausea and vomiting or a severe headache please notify your doctor immediately.  FOLLOW-UP:  Please make an appointment with your surgeon as instructed. You do not need to follow up with anesthesia unless specifically instructed to do so.  WOUND CARE INSTRUCTIONS (if applicable):  Keep a dry clean dressing on the anesthesia/puncture wound site if there is drainage.  Once the wound has quit draining you may leave it open to air.  Generally  you should leave the bandage intact for twenty four hours unless there is drainage.  If the epidural site drains for more than 36-48 hours please call the anesthesia department.  QUESTIONS?:  Please feel free to call your physician or the hospital operator if you have any questions, and they will be happy to assist you.

## 2013-05-12 NOTE — H&P (Signed)
Sara Haynes is an 68 y.o. female. She is referred to our office courtesy of Tula Nakayama, MD for an endometrial polyp noted on gyn u/s , and has had an endometrial biopsy that is negative for malignancy. She is scheduled for hysteroscopy, dilation and curettage, and removal of endometrial polyp.   Pertinent Gynecological History: Menses: post-menopausal Bleeding: post menopausal bleeding Contraception: post menopausal status DES exposure: unknown Blood transfusions: none Sexually transmitted diseases: no past history Previous GYN Procedures:   Last mammogram: normal Date: 08/2012 Last pap: normal Date: 03/2013 OB History: G-, P-   Menstrual History: Menarche age: -  No LMP recorded. Patient is postmenopausal.    Past Medical History  Diagnosis Date  . Hypertension 1985    Never successfully treated.  . Hyperlipidemia 11/04/2010  . Abnormal electrocardiogram 02/03/2010    Sinus bradycardia  . Medication intolerance   . Anemia   . Tinnitus of both ears     Past Surgical History  Procedure Laterality Date  . Tubal ligation  1977  . Cataract extraction  2011    Bilateral  . Colonoscopy    . Colonoscopy  09/20/2011    Procedure: COLONOSCOPY;  Surgeon: Rogene Houston, MD;  Location: [facility];  Service: Endoscopy;  Laterality: N/A;  930  . Eye surgery Right 11/2010    cataract extraction  . Eye surgery Left 01/2011    cataract extraction  . Arm skin lesion biopsy / excision Right     fatty tumor    Family History  Problem Relation Age of Onset  . COPD Mother   . Diabetes Mother   . Pneumonia Father   . Hypertension Sister   . Diabetes Brother   . Alcohol abuse Father     Social History:  reports that she has never smoked. She has never used smokeless tobacco. She reports that she does not drink alcohol or use illicit drugs.  Allergies:  Allergies  Allergen Reactions  . Amlodipine     All over cramps  . Hydrochlorothiazide W-Triamterene    REACTION: cramps  . Lisinopril     Dizzy and leg cramps     No prescriptions prior to admission    ROS  There were no vitals taken for this visit. Physical Exam  Constitutional: She is oriented to person, place, and time. She appears well-developed and well-nourished.  HENT:  Head: Atraumatic.  Neck: Neck supple.  Cardiovascular: Normal rate, regular rhythm, normal heart sounds and intact distal pulses.  Exam reveals no friction rub.   No murmur heard. Respiratory: Effort normal. No respiratory distress. She has no rales.  GI: Bowel sounds are normal. She exhibits no distension. There is no tenderness.  Genitourinary: Vagina normal.  Musculoskeletal: Normal range of motion.  Neurological: She is alert and oriented to person, place, and time.  Psychiatric: She has a normal mood and affect.   CBC    Component Value Date/Time   WBC 4.4 05/07/2013 0845   RBC 3.55* 05/07/2013 0845   HGB 10.7* 05/07/2013 0845   HCT 32.3* 05/07/2013 0845   PLT 188 05/07/2013 0845   MCV 91.0 05/07/2013 0845   MCH 30.1 05/07/2013 0845   MCHC 33.1 05/07/2013 0845   RDW 13.9 05/07/2013 0845   LYMPHSABS 1.9 03/22/2013 0929   MONOABS 0.2 03/22/2013 0929   EOSABS 0.1 03/22/2013 0929   BASOSABS 0.0 03/22/2013 0929      No results found for this or any previous visit (from the past 24 hour(s)).  No results found.  Assessment/Plan: Postmenopausal endometrial polyp Plan: Hysteroscopy, Dilaiton and curettage, and removal of endometrial polyp.  Jonnie Kind 05/12/2013, 6:02 PM

## 2013-05-13 ENCOUNTER — Encounter (HOSPITAL_COMMUNITY): Payer: Self-pay | Admitting: *Deleted

## 2013-05-13 ENCOUNTER — Encounter (HOSPITAL_COMMUNITY): Payer: Medicare PPO | Admitting: Anesthesiology

## 2013-05-13 ENCOUNTER — Ambulatory Visit (HOSPITAL_COMMUNITY): Payer: Medicare PPO | Admitting: Anesthesiology

## 2013-05-13 ENCOUNTER — Encounter (HOSPITAL_COMMUNITY)
Admission: RE | Disposition: A | Discharge status: Home / Self Care | Payer: Self-pay | Source: Ambulatory Visit | Attending: Obstetrics and Gynecology

## 2013-05-13 ENCOUNTER — Ambulatory Visit (HOSPITAL_COMMUNITY)
Admission: RE | Admit: 2013-05-13 | Discharge: 2013-05-13 | Disposition: A | Discharge status: Home / Self Care | Payer: Medicare PPO | Source: Ambulatory Visit | Attending: Obstetrics and Gynecology | Admitting: Obstetrics and Gynecology

## 2013-05-13 DIAGNOSIS — E785 Hyperlipidemia, unspecified: Secondary | ICD-10-CM | POA: Insufficient documentation

## 2013-05-13 DIAGNOSIS — N840 Polyp of corpus uteri: Secondary | ICD-10-CM | POA: Insufficient documentation

## 2013-05-13 DIAGNOSIS — N950 Postmenopausal bleeding: Secondary | ICD-10-CM | POA: Insufficient documentation

## 2013-05-13 DIAGNOSIS — D649 Anemia, unspecified: Secondary | ICD-10-CM | POA: Insufficient documentation

## 2013-05-13 DIAGNOSIS — Z888 Allergy status to other drugs, medicaments and biological substances status: Secondary | ICD-10-CM | POA: Insufficient documentation

## 2013-05-13 DIAGNOSIS — I498 Other specified cardiac arrhythmias: Secondary | ICD-10-CM | POA: Insufficient documentation

## 2013-05-13 DIAGNOSIS — I10 Essential (primary) hypertension: Secondary | ICD-10-CM | POA: Insufficient documentation

## 2013-05-13 DIAGNOSIS — H9319 Tinnitus, unspecified ear: Secondary | ICD-10-CM | POA: Insufficient documentation

## 2013-05-13 HISTORY — PX: HYSTEROSCOPY W/D&C: SHX1775

## 2013-05-13 HISTORY — PX: POLYPECTOMY: SHX5525

## 2013-05-13 SURGERY — DILATATION AND CURETTAGE /HYSTEROSCOPY
Anesthesia: General | Site: Uterus

## 2013-05-13 MED ORDER — MIDAZOLAM HCL 2 MG/2ML IJ SOLN
1.0000 mg | INTRAMUSCULAR | Status: DC | PRN
  Administered 2013-05-13: 2 mg via INTRAVENOUS

## 2013-05-13 MED ORDER — GLYCOPYRROLATE 0.2 MG/ML IJ SOLN
0.2000 mg | Freq: Once | INTRAMUSCULAR | Status: AC
  Administered 2013-05-13: 0.2 mg via INTRAVENOUS

## 2013-05-13 MED ORDER — LIDOCAINE HCL (PF) 1 % IJ SOLN
INTRAMUSCULAR | Status: AC
  Filled 2013-05-13: qty 5

## 2013-05-13 MED ORDER — FENTANYL CITRATE 0.05 MG/ML IJ SOLN
INTRAMUSCULAR | Status: AC
  Filled 2013-05-13: qty 2

## 2013-05-13 MED ORDER — MIDAZOLAM HCL 2 MG/2ML IJ SOLN
INTRAMUSCULAR | Status: AC
  Filled 2013-05-13: qty 2

## 2013-05-13 MED ORDER — FENTANYL CITRATE 0.05 MG/ML IJ SOLN: 25.0000 ug | INTRAMUSCULAR | Status: DC | PRN

## 2013-05-13 MED ORDER — BUPIVACAINE-EPINEPHRINE 0.5% -1:200000 IJ SOLN
INTRAMUSCULAR | Status: DC | PRN
  Administered 2013-05-13: 10 mL

## 2013-05-13 MED ORDER — LACTATED RINGERS IV SOLN
INTRAVENOUS | Status: DC
  Administered 2013-05-13: 07:00:00 via INTRAVENOUS

## 2013-05-13 MED ORDER — FENTANYL CITRATE 0.05 MG/ML IJ SOLN
25.0000 ug | INTRAMUSCULAR | Status: AC
  Administered 2013-05-13: 25 ug via INTRAVENOUS

## 2013-05-13 MED ORDER — ONDANSETRON HCL 4 MG/2ML IJ SOLN
4.0000 mg | Freq: Once | INTRAMUSCULAR | Status: AC
Start: 2013-05-13 — End: 2013-05-13
  Administered 2013-05-13: 4 mg via INTRAVENOUS

## 2013-05-13 MED ORDER — IBUPROFEN 600 MG PO TABS: 600.0000 mg | ORAL_TABLET | Freq: Four times a day (QID) | ORAL | Status: DC | PRN

## 2013-05-13 MED ORDER — FENTANYL CITRATE 0.05 MG/ML IJ SOLN
INTRAMUSCULAR | Status: DC | PRN
  Administered 2013-05-13 (×2): 25 ug via INTRAVENOUS
  Administered 2013-05-13: 50 ug via INTRAVENOUS

## 2013-05-13 MED ORDER — BUPIVACAINE-EPINEPHRINE (PF) 0.5% -1:200000 IJ SOLN
INTRAMUSCULAR | Status: AC
  Filled 2013-05-13: qty 30

## 2013-05-13 MED ORDER — PROPOFOL 10 MG/ML IV BOLUS
INTRAVENOUS | Status: DC | PRN
  Administered 2013-05-13: 130 mg via INTRAVENOUS

## 2013-05-13 MED ORDER — PROPOFOL 10 MG/ML IV EMUL
INTRAVENOUS | Status: AC
  Filled 2013-05-13: qty 20

## 2013-05-13 MED ORDER — LIDOCAINE HCL 1 % IJ SOLN
INTRAMUSCULAR | Status: DC | PRN
  Administered 2013-05-13: 50 mg via INTRADERMAL

## 2013-05-13 MED ORDER — MIDAZOLAM HCL 5 MG/5ML IJ SOLN
INTRAMUSCULAR | Status: DC | PRN
  Administered 2013-05-13: 2 mg via INTRAVENOUS

## 2013-05-13 MED ORDER — GLYCOPYRROLATE 0.2 MG/ML IJ SOLN
INTRAMUSCULAR | Status: AC
  Filled 2013-05-13: qty 1

## 2013-05-13 MED ORDER — ONDANSETRON HCL 4 MG/2ML IJ SOLN
INTRAMUSCULAR | Status: AC
  Filled 2013-05-13: qty 2

## 2013-05-13 MED ORDER — SODIUM CHLORIDE 0.9 % IR SOLN
Status: DC | PRN
  Administered 2013-05-13: 1000 mL

## 2013-05-13 MED ORDER — ONDANSETRON HCL 4 MG/2ML IJ SOLN
4.0000 mg | Freq: Once | INTRAMUSCULAR | Status: DC | PRN
Start: 2013-05-13 — End: 2013-05-13

## 2013-05-13 SURGICAL SUPPLY — 39 items
BAG HAMPER (MISCELLANEOUS) ×3 IMPLANT
CLOTH BEACON ORANGE TIMEOUT ST (SAFETY) ×3 IMPLANT
COVER LIGHT HANDLE STERIS (MISCELLANEOUS) ×6 IMPLANT
DECANTER SPIKE VIAL GLASS SM (MISCELLANEOUS) ×3 IMPLANT
DRAPE PROXIMA HALF (DRAPES) ×3 IMPLANT
DRAPE STERI URO 9X17 APER PCH (DRAPES) ×3 IMPLANT
ELECT REM PT RETURN 9FT ADLT (ELECTROSURGICAL) ×3
ELECTRODE REM PT RTRN 9FT ADLT (ELECTROSURGICAL) ×1 IMPLANT
FORMALIN 10 PREFIL 120ML (MISCELLANEOUS) ×3 IMPLANT
FORMALIN 10 PREFIL 480ML (MISCELLANEOUS) ×1 IMPLANT
GAUZE PACKING 2X5 YD STRL (GAUZE/BANDAGES/DRESSINGS) ×3 IMPLANT
GLOVE BIOGEL PI IND STRL 7.0 (GLOVE) IMPLANT
GLOVE BIOGEL PI IND STRL 9 (GLOVE) ×1 IMPLANT
GLOVE BIOGEL PI INDICATOR 7.0 (GLOVE) ×2
GLOVE BIOGEL PI INDICATOR 9 (GLOVE) ×2
GLOVE ECLIPSE 9.0 STRL (GLOVE) ×3 IMPLANT
GLOVE EXAM NITRILE LRG STRL (GLOVE) ×2 IMPLANT
GLOVE SS BIOGEL STRL SZ 6.5 (GLOVE) IMPLANT
GLOVE SUPERSENSE BIOGEL SZ 6.5 (GLOVE) ×2
GOWN SPEC L3 XXLG W/TWL (GOWN DISPOSABLE) ×4 IMPLANT
GOWN STRL REUS W/TWL LRG LVL3 (GOWN DISPOSABLE) ×3 IMPLANT
INST SET HYSTEROSCOPY (KITS) ×3 IMPLANT
IV NS 1000ML (IV SOLUTION) ×3
IV NS 1000ML BAXH (IV SOLUTION) ×1 IMPLANT
KIT ROOM TURNOVER APOR (KITS) ×3 IMPLANT
MANIFOLD NEPTUNE II (INSTRUMENTS) ×3 IMPLANT
NS IRRIG 1000ML POUR BTL (IV SOLUTION) ×3 IMPLANT
PACK PERI GYN (CUSTOM PROCEDURE TRAY) ×3 IMPLANT
PAD ARMBOARD 7.5X6 YLW CONV (MISCELLANEOUS) ×3 IMPLANT
PAD TELFA 3X4 1S STER (GAUZE/BANDAGES/DRESSINGS) ×3 IMPLANT
SET BASIN LINEN APH (SET/KITS/TRAYS/PACK) ×3 IMPLANT
SET CYSTO W/LG BORE CLAMP LF (SET/KITS/TRAYS/PACK) ×3 IMPLANT
SET IV ADMIN VERSALIGHT (MISCELLANEOUS) IMPLANT
SUT CHROMIC 2 0 CT 1 (SUTURE) IMPLANT
SUT PROLENE 2 0 FS (SUTURE) IMPLANT
SUT VIC AB 0 CT2 8-18 (SUTURE) IMPLANT
SYR CONTROL 10ML LL (SYRINGE) ×2 IMPLANT
VERSALIGHT (MISCELLANEOUS) IMPLANT
YANKAUER SUCT BULB TIP 10FT TU (MISCELLANEOUS) ×2 IMPLANT

## 2013-05-13 NOTE — Anesthesia Preprocedure Evaluation (Signed)
Anesthesia Evaluation  Patient identified by MRN, date of birth, ID band Patient awake    Airway Mallampati: II TM Distance: >3 FB     Dental  (+) Edentulous Upper, Poor Dentition, Missing,    Pulmonary neg pulmonary ROS,  breath sounds clear to auscultation        Cardiovascular hypertension, Pt. on medications Rhythm:Regular Rate:Normal     Neuro/Psych    GI/Hepatic   Endo/Other    Renal/GU      Musculoskeletal   Abdominal   Peds  Hematology  (+) anemia ,   Anesthesia Other Findings   Reproductive/Obstetrics                           Anesthesia Physical Anesthesia Plan  ASA: II  Anesthesia Plan: General   Post-op Pain Management:    Induction: Intravenous  Airway Management Planned: LMA  Additional Equipment:   Intra-op Plan:   Post-operative Plan: Extubation in OR  Informed Consent: I have reviewed the patients History and Physical, chart, labs and discussed the procedure including the risks, benefits and alternatives for the proposed anesthesia with the patient or authorized representative who has indicated his/her understanding and acceptance.     Plan Discussed with:   Anesthesia Plan Comments:         Anesthesia Quick Evaluation

## 2013-05-13 NOTE — Brief Op Note (Signed)
05/13/2013  8:09 AM  PATIENT:  Sara Haynes  68 y.o. female  PRE-OPERATIVE DIAGNOSIS:  endometrial polyp  POST-OPERATIVE DIAGNOSIS:  endometrial polyp  PROCEDURE:  Procedure(s): DILATATION AND CURETTAGE /HYSTEROSCOPY (N/A) POLYPECTOMY (removal endometrial polyp) (N/A)  SURGEON:  Surgeon(s) and Role:    * Jonnie Kind, MD - Primary  PHYSICIAN ASSISTANT:   ASSISTANTS: WITT, CST  ANESTHESIA:   local and general  EBL:  Total I/O In: 200 [I.V.:200] Out: -   BLOOD ADMINISTERED:none  DRAINS: none   LOCAL MEDICATIONS USED:  MARCAINE    and Amount: 10 ml  SPECIMEN:  Source of Specimen:  Endometrial polyp  DISPOSITION OF SPECIMEN:  PATHOLOGY  COUNTS:  YES  TOURNIQUET:  * No tourniquets in log *  DICTATION: .Dragon Dictation  PLAN OF CARE: Discharge to home after PACU  PATIENT DISPOSITION:  PACU - hemodynamically stable.   Delay start of Pharmacological VTE agent (>24hrs) due to surgical blood loss or risk of bleeding: not applicable

## 2013-05-13 NOTE — Transfer of Care (Signed)
Immediate Anesthesia Transfer of Care Note  Patient: Sara Haynes  Procedure(s) Performed: Procedure(s): DILATATION AND CURETTAGE /HYSTEROSCOPY (N/A) POLYPECTOMY (removal endometrial polyp) (N/A)  Patient Location: PACU  Anesthesia Type:General  Level of Consciousness: awake and patient cooperative  Airway & Oxygen Therapy: Patient Spontanous Breathing and Patient connected to face mask oxygen  Post-op Assessment: Report given to PACU RN, Post -op Vital signs reviewed and stable and Patient moving all extremities  Post vital signs: Reviewed and stable  Complications: No apparent anesthesia complications

## 2013-05-13 NOTE — Anesthesia Procedure Notes (Signed)
Procedure Name: LMA Insertion Date/Time: 05/13/2013 7:44 AM Performed by: Charmaine Downs Pre-anesthesia Checklist: Patient identified, Emergency Drugs available, Suction available and Patient being monitored Patient Re-evaluated:Patient Re-evaluated prior to inductionOxygen Delivery Method: Circle system utilized Preoxygenation: Pre-oxygenation with 100% oxygen Intubation Type: IV induction Ventilation: Mask ventilation without difficulty LMA: LMA inserted LMA Size: 3.0 Tube type: Oral Number of attempts: 1 Placement Confirmation: positive ETCO2 and breath sounds checked- equal and bilateral Tube secured with: Tape Dental Injury: Teeth and Oropharynx as per pre-operative assessment

## 2013-05-13 NOTE — Op Note (Signed)
  8:09 AM  PATIENT:  Sara Haynes  68 y.o. female  PRE-OPERATIVE DIAGNOSIS:  endometrial polyp  POST-OPERATIVE DIAGNOSIS:  endometrial polyp  PROCEDURE:  Procedure(s): DILATATION AND CURETTAGE /HYSTEROSCOPY (N/A) POLYPECTOMY (removal endometrial polyp) (N/A)  SURGEON:  Surgeon(s) and Role:    * Jonnie Kind, MD - Primary  PHYSICIAN ASSISTANT:   ASSISTANTS: WITT, CST  ANESTHESIA:   local and general  EBL:  Total I/O In: 200 [I.V.:200] Out: -  Details of procedure: Patient taken operating room prepped and draped for vaginal procedure, timeout conducted and surgical team confirm procedure and personnel. Speculum was inserted, cervix grasped with single-tooth tenaculum sounded anteriorly to 7 cm dilated to 23 [place], introducing the rigid 30 operative hysteroscope identifying a thin atrophic endometrium with a 1 cm polyp on the anterior uterine wall which could be amputated at its base with operative scissors and extracted for a specimen. Repeat hysteroscopy confirmed satisfactory extraction and removal and normal endometrial cavity. Photos were taken to document procedure. Paracervical block with 10 cc of Marcaine was injected and patient to recovery room in stable condition

## 2013-05-13 NOTE — Interval H&P Note (Signed)
History and Physical Interval Note:  05/13/2013 7:23 AM  Sara Haynes  has presented today for surgery, with the diagnosis of endometrial polyp  The various methods of treatment have been discussed with the patient and family. After consideration of risks, benefits and other options for treatment, the patient has consented to  Procedure(s): DILATATION AND CURETTAGE /HYSTEROSCOPY (N/A) POLYPECTOMY (removal endometrial polyp) (N/A) as a surgical intervention .  The patient's history has been reviewed, patient examined, no change in status, stable for surgery.  I have reviewed the patient's chart and labs.  Questions were answered to the patient's satisfaction.     Jonnie Kind

## 2013-05-13 NOTE — Anesthesia Postprocedure Evaluation (Signed)
  Anesthesia Post-op Note  Patient: Sara Haynes  Procedure(s) Performed: Procedure(s): DILATATION AND CURETTAGE /HYSTEROSCOPY (N/A) POLYPECTOMY (removal endometrial polyp) (N/A)  Patient Location: PACU  Anesthesia Type:General  Level of Consciousness: awake, alert , oriented and patient cooperative  Airway and Oxygen Therapy: Patient Spontanous Breathing  Post-op Pain: none  Post-op Assessment: Post-op Vital signs reviewed, Patient's Cardiovascular Status Stable, Respiratory Function Stable, Patent Airway, No signs of Nausea or vomiting and Pain level controlled  Post-op Vital Signs: Reviewed and stable  Last Vitals:  Filed Vitals:   05/13/13 0729  BP: 142/83  Pulse: 93  Temp:   Resp: 18    Complications: No apparent anesthesia complications

## 2013-05-14 ENCOUNTER — Encounter (HOSPITAL_COMMUNITY): Payer: Self-pay | Admitting: Obstetrics and Gynecology

## 2013-05-20 ENCOUNTER — Encounter: Payer: Medicare PPO | Admitting: Obstetrics and Gynecology

## 2013-05-28 ENCOUNTER — Encounter: Payer: Self-pay | Admitting: Obstetrics and Gynecology

## 2013-05-28 ENCOUNTER — Ambulatory Visit (INDEPENDENT_AMBULATORY_CARE_PROVIDER_SITE_OTHER): Payer: Medicare PPO | Admitting: Obstetrics and Gynecology

## 2013-05-28 VITALS — BP 160/110 | Ht 66.0 in | Wt 160.8 lb

## 2013-05-28 DIAGNOSIS — N840 Polyp of corpus uteri: Secondary | ICD-10-CM

## 2013-05-28 DIAGNOSIS — Z9889 Other specified postprocedural states: Secondary | ICD-10-CM

## 2013-05-28 NOTE — Progress Notes (Signed)
Subjective:     Sara Haynes is a 68 y.o. female who presents to the clinic 2 weeks status post operative hysteroscopy for polyp. Diet:       regular without difficulty. Bowel function is: normal. Pain:     The patient is not having any pain.  The pt denies any urinary complaints due to cystocele and uterine relaxation at present, so we simply focused on polyp removal.    Review of Systems Gastrointestinal: negative for abdominal pain, change in bowel habits, constipation and diarrhea    Objective:    BP 160/110  Ht 5\' 6"  (1.676 m)  Wt 160 lb 12.8 oz (72.938 kg)  BMI 25.97 kg/m2 General:  alert, cooperative and no distress  Abdomen: soft, bowel sounds active, non-tender  Incision:   n/a       Pelvic: cystocele present , assymptomatic.                      Uterus small mild laxity, assymptomatic              Adnexa normal    Assessment:    Doing well postoperatively. Operative findings again reviewed. Pathology report discussed. benign polyp  cystocele stable   Mild pelvic relaxation, assymptomatic at present Plan:    1. Continue any current medications. 2. Wound care discussed. 3. Activity restrictions: none 4. Anticipated return to work: now. 5. Follow up: prn 1 yr or prn.

## 2013-08-14 ENCOUNTER — Encounter (INDEPENDENT_AMBULATORY_CARE_PROVIDER_SITE_OTHER): Payer: Self-pay

## 2013-08-14 ENCOUNTER — Ambulatory Visit (INDEPENDENT_AMBULATORY_CARE_PROVIDER_SITE_OTHER): Payer: Medicare PPO | Admitting: Family Medicine

## 2013-08-14 ENCOUNTER — Encounter: Payer: Self-pay | Admitting: Family Medicine

## 2013-08-14 VITALS — BP 180/108 | HR 89 | Resp 16 | Ht 66.0 in | Wt 167.4 lb

## 2013-08-14 DIAGNOSIS — Z0000 Encounter for general adult medical examination without abnormal findings: Secondary | ICD-10-CM

## 2013-08-14 DIAGNOSIS — Z888 Allergy status to other drugs, medicaments and biological substances status: Secondary | ICD-10-CM

## 2013-08-14 DIAGNOSIS — E785 Hyperlipidemia, unspecified: Secondary | ICD-10-CM

## 2013-08-14 DIAGNOSIS — Z789 Other specified health status: Secondary | ICD-10-CM

## 2013-08-14 DIAGNOSIS — R7303 Prediabetes: Secondary | ICD-10-CM

## 2013-08-14 DIAGNOSIS — Z23 Encounter for immunization: Secondary | ICD-10-CM | POA: Insufficient documentation

## 2013-08-14 DIAGNOSIS — I10 Essential (primary) hypertension: Secondary | ICD-10-CM

## 2013-08-14 MED ORDER — SPIRONOLACTONE 25 MG PO TABS: 25.0000 mg | ORAL_TABLET | Freq: Every day | ORAL | Status: DC

## 2013-08-14 NOTE — Assessment & Plan Note (Signed)
Vaccine administered.

## 2013-08-14 NOTE — Patient Instructions (Addendum)
F/u in [employer], call if you need me before  Fasting chem 7, lipid, cbc in October  Prevnar today  You will be contacted for your mammogram  It is important that you exercise regularly at least 30 minutes 5 times a week. If you develop chest pain, have severe difficulty breathing, or feel very tired, stop exercising immediately and seek medical attention   BP is tOO high, pls take meds on regular schedule  New is spironolactone one daily, STOP hCTZ and potassium

## 2013-08-14 NOTE — Assessment & Plan Note (Signed)
Uncontrolled, reports intolerance to medication Change to spirolonolactone from HCTZ and potassium DASH diet and commitment to daily physical activity for a minimum of 30 minutes discussed and encouraged, as a part of hypertension management. The importance of attaining a healthy weight is also discussed.

## 2013-08-14 NOTE — Assessment & Plan Note (Signed)
Ongoing problem medication changed today as far as HCTZ is concerned, f/u in 2 month

## 2013-08-14 NOTE — Assessment & Plan Note (Signed)
Annual exam as documented. Counseling done  re healthy lifestyle involving commitment to 150 minutes exercise per week, heart healthy diet, and attaining healthy weight.The importance of adequate sleep also discussed. Regular seat belt use and safe storage  of firearms if patient has them, is also discussed. Changes in health habits are decided on by the patient with goals and time frames  set for achieving them. Immunization and cancer screening needs are specifically addressed at this visit.  

## 2013-08-14 NOTE — Progress Notes (Signed)
Subjective:    Patient ID: Sara Haynes, female    DOB: 1945-11-01, 68 y.o.   MRN: [id number]  HPI Preventive Screening-Counseling & Management   Patient present here today for a subsequent Medicare annual wellness visit.  Blood pressure is markedly  Elevated and ptt reports intolerance to medication so this needs to be addressed also   Current Problems (verified)   Medications Prior to Visit Allergies (verified)   PAST HISTORY  Family History (verified)   Social History Married for 68 years, retired from her job as a Quarry manager. Has 3 children    Risk Factors  Current exercise habits:  Occasionally she uses her exercise tapes and she walks 2 days a week for 30 mins at a time   Dietary issues discussed: Heart healthy, reduce cars intake and eat more fruits and vegetables    Cardiac risk factors: stageb 2 hypertension uncontrolled  Depression Screen  (Note: if answer to either of the following is "Yes", a more complete depression screening is indicated)   Over the past two weeks, have you felt down, depressed or hopeless? No  Over the past two weeks, have you felt little interest or pleasure in doing things? No  Have you lost interest or pleasure in daily life? No  Do you often feel hopeless? No  Do you cry easily over simple problems? No   Activities of Daily Living  In your present state of health, do you have any difficulty performing the following activities?  Driving?: No Managing money?: No Feeding yourself?:No Getting from bed to chair?:No Climbing a flight of stairs?:No Preparing food and eating?:No Bathing or showering?:No Getting dressed?:No Getting to the toilet?:No Using the toilet?:No Moving around from place to place?: No  Fall Risk Assessment In the past year have you fallen or had a near fall?: no  Are you currently taking any medications that make you dizziness?: yes, when she takes her BP meds as prescribed, they make her dizzy    Hearing  Difficulties: No Do you often ask people to speak up or repeat themselves?:No Do you experience ringing or noises in your ears?: has been having ringing in her ears for a while. Has gotten used to it  Do you have difficulty understanding soft or whispered voices?: not really   Cognitive Testing  Alert? Yes Normal Appearance?Yes  Oriented to person? Yes Place? Yes  Time? Yes  Displays appropriate judgment?Yes  Can read the correct time from a watch face? yes Are you having problems remembering things?No  Advanced Directives have been discussed with the patient? Yes, no living will but brochure , full code   List the Names of Other Physician/Practitioners you currently use:  Dr Glo Herring (Ob gyn)   Indicate any recent Medical Services you may have received from other than [facility] providers in the past year (date may be approximate).   Assessment:    Annual Wellness Exam   Plan:    .  Medicare Attestation  I have personally reviewed:  The patient's medical and social history  Their use of alcohol, tobacco or illicit drugs  Their current medications and supplements  The patient's functional ability including ADLs,fall risks, home safety risks, cognitive, and hearing and visual impairment  Diet and physical activities  Evidence for depression or mood disorders  The patient's weight, height, BMI, and visual acuity have been recorded in the chart. I have made referrals, counseling, and provided education to the patient based on review of the above and I  have provided the patient with a written personalized care plan for preventive services.      Review of Systems     Objective:   Physical Exam  BP 180/108  Pulse 89  Resp 16  Ht 5\' 6"  (1.676 m)  Wt 167 lb 6.4 oz (75.932 kg)  BMI 27.03 kg/m2  SpO2 98% Patient alert and oriented and in no cardiopulmonary distress.  HEENT: No facial asymmetry, EOMI,   oropharynx pink and moist.  Neck supple no JVD, no mass.  Chest: Clear to  auscultation bilaterally.  CVS: S1, S2 no murmurs, no S3.Regular rate.    Ext: No edema         Assessment & Plan:  Medicare annual wellness visit, subsequent Annual exam as documented. Counseling done  re healthy lifestyle involving commitment to 150 minutes exercise per week, heart healthy diet, and attaining healthy weight.The importance of adequate sleep also discussed. Regular seat belt use and safe storage  of firearms if patient has them, is also discussed. Changes in health habits are decided on by the patient with goals and time frames  set for achieving them. Immunization and cancer screening needs are specifically addressed at this visit.   Need for vaccination with 13-polyvalent pneumococcal conjugate vaccine Vaccine administered  Hypertension Uncontrolled, reports intolerance to medication Change to spirolonolactone from HCTZ and potassium DASH diet and commitment to daily physical activity for a minimum of 30 minutes discussed and encouraged, as a part of hypertension management. The importance of attaining a healthy weight is also discussed.   Medication intolerance Ongoing problem medication changed today as far as HCTZ is concerned, f/u in 2 month

## 2013-10-01 LAB — LIPID PANEL
CHOLESTEROL: 189 mg/dL (ref 0–200)
HDL: 54 mg/dL (ref >39)
LDL Cholesterol: 119 mg/dL — ABNORMAL HIGH (ref 0–99)
Total CHOL/HDL Ratio: 3.5 Ratio
Triglycerides: 79 mg/dL (ref <150)
VLDL: 16 mg/dL (ref 0–40)

## 2013-10-01 LAB — CBC WITH DIFFERENTIAL/PLATELET
Basophils Absolute: 0 10*3/uL (ref 0.0–0.1)
Basophils Relative: 0 % (ref 0–1)
EOS ABS: 0.1 10*3/uL (ref 0.0–0.7)
EOS PCT: 3 % (ref 0–5)
HCT: 32.2 % — ABNORMAL LOW (ref 36.0–46.0)
HEMOGLOBIN: 10.9 g/dL — AB (ref 12.0–15.0)
Lymphocytes Relative: 57 % — ABNORMAL HIGH (ref 12–46)
Lymphs Abs: 2.2 10*3/uL (ref 0.7–4.0)
MCH: 29.5 pg (ref 26.0–34.0)
MCHC: 33.9 g/dL (ref 30.0–36.0)
MCV: 87.3 fL (ref 78.0–100.0)
MONOS PCT: 5 % (ref 3–12)
Monocytes Absolute: 0.2 10*3/uL (ref 0.1–1.0)
NEUTROS PCT: 35 % — AB (ref 43–77)
Neutro Abs: 1.3 10*3/uL — ABNORMAL LOW (ref 1.7–7.7)
Platelets: 181 10*3/uL (ref 150–400)
RBC: 3.69 MIL/uL — ABNORMAL LOW (ref 3.87–5.11)
RDW: 14.6 % (ref 11.5–15.5)
WBC: 3.8 10*3/uL — ABNORMAL LOW (ref 4.0–10.5)

## 2013-10-01 LAB — BASIC METABOLIC PANEL
BUN: 25 mg/dL — AB (ref 6–23)
CALCIUM: 9.2 mg/dL (ref 8.4–10.5)
CO2: 24 mEq/L (ref 19–32)
Chloride: 108 mEq/L (ref 96–112)
Creat: 1.18 mg/dL — ABNORMAL HIGH (ref 0.50–1.10)
GLUCOSE: 83 mg/dL (ref 70–99)
Potassium: 4.4 mEq/L (ref 3.5–5.3)
Sodium: 144 mEq/L (ref 135–145)

## 2013-10-02 ENCOUNTER — Encounter: Payer: Self-pay | Admitting: Family Medicine

## 2013-10-02 ENCOUNTER — Encounter (INDEPENDENT_AMBULATORY_CARE_PROVIDER_SITE_OTHER): Payer: Self-pay

## 2013-10-02 ENCOUNTER — Ambulatory Visit (INDEPENDENT_AMBULATORY_CARE_PROVIDER_SITE_OTHER): Payer: Medicare PPO | Admitting: Family Medicine

## 2013-10-02 VITALS — BP 176/86 | HR 72 | Resp 18 | Ht 66.0 in | Wt 173.0 lb

## 2013-10-02 DIAGNOSIS — R7309 Other abnormal glucose: Secondary | ICD-10-CM

## 2013-10-02 DIAGNOSIS — R7303 Prediabetes: Secondary | ICD-10-CM

## 2013-10-02 DIAGNOSIS — E785 Hyperlipidemia, unspecified: Secondary | ICD-10-CM

## 2013-10-02 DIAGNOSIS — Z23 Encounter for immunization: Secondary | ICD-10-CM

## 2013-10-02 DIAGNOSIS — I150 Renovascular hypertension: Secondary | ICD-10-CM

## 2013-10-02 MED ORDER — SPIRONOLACTONE 25 MG PO TABS: 25.0000 mg | ORAL_TABLET | Freq: Two times a day (BID) | ORAL | Status: DC

## 2013-10-02 NOTE — Assessment & Plan Note (Signed)
Uncontrolled, change to spironolactone only at higher dose DASH diet and commitment to daily physical activity for a minimum of 30 minutes discussed and encouraged, as a part of hypertension management. The importance of attaining a healthy weight is also discussed. Return in 6 weeks for re eval

## 2013-10-02 NOTE — Assessment & Plan Note (Signed)
Deteriorated Hyperlipidemia:Low fat diet discussed and encouraged.   

## 2013-10-02 NOTE — Progress Notes (Signed)
   Subjective:    Patient ID: Sara Haynes, female    DOB: 19-Dec-1945, 68 y.o.   MRN: [id number]  HPI The PT is here for follow up and re-evaluation of chronic medical conditions, medication management and review of any available recent lab and radiology data.  Preventive health is updated, specifically  Cancer screening and Immunization.    The PT denies any adverse reactions to new BP med, spironolactone, states tolerates tois better than amlodipine, which she has stopped taking There are no new concerns.  There are no specific complaints       Review of Systems See HPI Denies recent fever or chills. Denies sinus pressure, nasal congestion, ear pain or sore throat. Denies chest congestion, productive cough or wheezing. Denies chest pains, palpitations and leg swelling Denies abdominal pain, nausea, vomiting,diarrhea or constipation.   Denies dysuria, frequency, hesitancy or incontinence. Denies joint pain, swelling and limitation in mobility. Denies headaches, seizures, numbness, or tingling. Denies depression, anxiety or insomnia. Denies skin break down or rash.        Objective:   Physical Exam  BP 176/86  Pulse 72  Resp 18  Ht 5\' 6"  (1.676 m)  Wt 173 lb (78.472 kg)  BMI 27.94 kg/m2  SpO2 98% Patient alert and oriented and in no cardiopulmonary distress.  HEENT: No facial asymmetry, EOMI,   oropharynx pink and moist.  Neck supple no JVD, no mass.  Chest: Clear to auscultation bilaterally.  CVS: S1, S2 no murmurs, no S3.Regular rate.  ABD: Soft non tender.   Ext: No edema  MS: Adequate ROM spine, shoulders, hips and knees.  Skin: Intact, no ulcerations or rash noted.  Psych: Good eye contact, normal affect. Memory intact not anxious or depressed appearing.  CNS: CN 2-12 intact, power,  normal throughout.no focal deficits noted.       Assessment & Plan:  Need for prophylactic vaccination and inoculation against influenza Vaccine administered  at visit.   Prediabetes Patient educated about the importance of limiting  Carbohydrate intake , the need to commit to daily physical activity for a minimum of 30 minutes , and to commit weight loss. The fact that changes in all these areas will reduce or eliminate all together the development of diabetes is stressed.   Updated lab needed at/ before next visit.   Hyperlipidemia LDL goal <100 Deteriorated Hyperlipidemia:Low fat diet discussed and encouraged.  \  Hypertension Uncontrolled, change to spironolactone only at higher dose DASH diet and commitment to daily physical activity for a minimum of 30 minutes discussed and encouraged, as a part of hypertension management. The importance of attaining a healthy weight is also discussed. Return in 6 weeks for re eval

## 2013-10-02 NOTE — Patient Instructions (Signed)
F/u in 6 weeks, call if you need me before  Flu vaccine today  BP is high, INCREASE spironolactone to TWICE daily, 8am and 8pm , tAKE CONSISTENTLY  Non fasting chem 7 and EGFR and HBa1C in 6 weeks    Ensure 64 ounces water every day  Cut back on swets, and fried and fatty foods, and commit to daily exercise for 30 mins

## 2013-10-02 NOTE — Assessment & Plan Note (Signed)
Patient educated about the importance of limiting  Carbohydrate intake , the need to commit to daily physical activity for a minimum of 30 minutes , and to commit weight loss. The fact that changes in all these areas will reduce or eliminate all together the development of diabetes is stressed.   Updated lab needed at/ before next visit.  

## 2013-10-02 NOTE — Assessment & Plan Note (Signed)
Vaccine administered at visit.  

## 2013-11-12 ENCOUNTER — Ambulatory Visit: Payer: Medicare PPO | Admitting: Family Medicine

## 2014-01-05 ENCOUNTER — Ambulatory Visit (INDEPENDENT_AMBULATORY_CARE_PROVIDER_SITE_OTHER): Payer: Medicare PPO | Admitting: Family Medicine

## 2014-01-05 ENCOUNTER — Encounter: Payer: Self-pay | Admitting: Family Medicine

## 2014-01-05 ENCOUNTER — Encounter (INDEPENDENT_AMBULATORY_CARE_PROVIDER_SITE_OTHER): Payer: Self-pay

## 2014-01-05 VITALS — BP 198/108 | HR 88 | Resp 16 | Ht 66.0 in | Wt 178.0 lb

## 2014-01-05 DIAGNOSIS — Z789 Other specified health status: Secondary | ICD-10-CM

## 2014-01-05 DIAGNOSIS — Z889 Allergy status to unspecified drugs, medicaments and biological substances status: Secondary | ICD-10-CM

## 2014-01-05 DIAGNOSIS — I10 Essential (primary) hypertension: Secondary | ICD-10-CM

## 2014-01-05 DIAGNOSIS — R7309 Other abnormal glucose: Secondary | ICD-10-CM

## 2014-01-05 DIAGNOSIS — R7303 Prediabetes: Secondary | ICD-10-CM

## 2014-01-05 MED ORDER — AMLODIPINE BESYLATE 2.5 MG PO TABS: 2.5000 mg | ORAL_TABLET | Freq: Every day | ORAL | Status: DC

## 2014-01-05 MED ORDER — SPIRONOLACTONE 25 MG PO TABS: 25.0000 mg | ORAL_TABLET | Freq: Every day | ORAL | Status: DC

## 2014-01-05 NOTE — Patient Instructions (Addendum)
F/U in 4.5 months , call if you need me beforer  Non fasting HBA1C and chem 7 and eGFR this week please  New additional medication for BP which is too high, is amlodipine 2.58m one at bedtime

## 2014-01-05 NOTE — Assessment & Plan Note (Addendum)
Uncontrolled, pt intolerant of spironolactone 25 mg twice daily, will stay on one daily and add amlodipine 2.5mg   Repeated unsuccessful attempts to control  BP both by myself and cardiology. Pt reports normal BP at home. She is to bring her meter to next visit. She is to callin if she becomes concerned that home bP reading is either too high or too low and will be checked in the office or the ED

## 2014-01-05 NOTE — Progress Notes (Signed)
   Subjective:    Patient ID: Sara Haynes, female    DOB: [birth year], 69 y.o.   MRN: [id number]  HPI Pt in for re evaluation os malignant hypertension, uncontrolled Reports normal bP at home most of the time, and has not brought in her meter C/o intolerance to medication prescribed so she reduced the dose   Review of Systems See HPI Denies recent fever or chills. Denies sinus pressure, nasal congestion, ear pain or sore throat. Denies chest congestion, productive cough or wheezing. Denies chest pains, palpitations and leg swelling Denies abdominal pain, nausea, vomiting,diarrhea or constipation.   Denies dysuria, frequency, hesitancy or incontinence. Denies joint pain, swelling and limitation in mobility. Denies headaches, seizures, numbness, or tingling. Denies depression, anxiety or insomnia. Denies skin break down or rash.        Objective:   Physical Exam BP 198/108 mmHg  Pulse 88  Resp 16  Ht 5\' 6"  (1.676 m)  Wt 178 lb (80.74 kg)  BMI 28.74 kg/m2  SpO2 98% Patient alert and oriented and in no cardiopulmonary distress.  HEENT: No facial asymmetry, EOMI,   oropharynx pink and moist.  Neck supple no JVD, no mass.  Chest: Clear to auscultation bilaterally.  CVS: S1, S2 no murmurs, no S3.Regular rate.  ABD: Soft non tender.   Ext: No edema  MS: Adequate ROM spine, shoulders, hips and knees.  Skin: Intact, no ulcerations or rash noted.  Psych: Good eye contact, normal affect. Memory intact not anxious or depressed appearing.  CNS: CN 2-12 intact, power,  normal throughout.no focal deficits noted.        Assessment & Plan:  Malignant hypertension Uncontrolled, pt intolerant of spironolactone 25 mg twice daily, will stay on one daily and add amlodipine 2.5mg   Repeated unsuccessful attempts to control  BP both by myself and cardiology. Pt reports normal BP at home. She is to bring her meter to next visit. She is to callin if she becomes concerned  that home bP reading is either too high or too low and will be checked in the office or the ED    Medication intolerance repreatredly reports intolerance to antihypertensive agents, will work with pt as best as I am able to. She is aware of uincreased risk of stroke, heart disease and kidney failure due to uncontrolled HTN Updated labs today   Prediabetes normqlized , which is great, pt applauded on this Continue to limit carbs , keep active and work on healthy weight

## 2014-01-11 LAB — COMPLETE METABOLIC PANEL WITH GFR
ALBUMIN: 4 g/dL (ref 3.5–5.2)
ALT: 14 U/L (ref 0–35)
AST: 18 U/L (ref 0–37)
Alkaline Phosphatase: 61 U/L (ref 39–117)
BILIRUBIN TOTAL: 0.6 mg/dL (ref 0.2–1.2)
BUN: 15 mg/dL (ref 6–23)
CALCIUM: 9.2 mg/dL (ref 8.4–10.5)
CO2: 25 meq/L (ref 19–32)
Chloride: 107 mEq/L (ref 96–112)
Creat: 0.92 mg/dL (ref 0.50–1.10)
GFR, EST NON AFRICAN AMERICAN: 64 mL/min
GFR, Est African American: 73 mL/min
GLUCOSE: 94 mg/dL (ref 70–99)
Potassium: 4.3 mEq/L (ref 3.5–5.3)
SODIUM: 140 meq/L (ref 135–145)
TOTAL PROTEIN: 7.3 g/dL (ref 6.0–8.3)

## 2014-01-11 LAB — HEMOGLOBIN A1C
HEMOGLOBIN A1C: 5.4 % (ref <5.7)
MEAN PLASMA GLUCOSE: 108 mg/dL (ref <117)

## 2014-01-14 NOTE — Assessment & Plan Note (Signed)
normqlized , which is great, pt applauded on this Continue to limit carbs , keep active and work on healthy weight

## 2014-01-14 NOTE — Assessment & Plan Note (Signed)
repreatredly reports intolerance to antihypertensive agents, will work with pt as best as I am able to. She is aware of uincreased risk of stroke, heart disease and kidney failure due to uncontrolled HTN Updated labs today

## 2014-06-09 ENCOUNTER — Ambulatory Visit: Payer: Medicare PPO | Admitting: Family Medicine

## 2014-06-18 ENCOUNTER — Encounter: Payer: Self-pay | Admitting: Family Medicine

## 2014-06-18 ENCOUNTER — Ambulatory Visit (INDEPENDENT_AMBULATORY_CARE_PROVIDER_SITE_OTHER): Payer: Medicare PPO | Admitting: Family Medicine

## 2014-06-18 VITALS — BP 180/110 | HR 96 | Resp 16 | Ht 66.0 in | Wt 179.0 lb

## 2014-06-18 DIAGNOSIS — Z9119 Patient's noncompliance with other medical treatment and regimen: Secondary | ICD-10-CM

## 2014-06-18 DIAGNOSIS — I10 Essential (primary) hypertension: Secondary | ICD-10-CM

## 2014-06-18 DIAGNOSIS — Z889 Allergy status to unspecified drugs, medicaments and biological substances status: Secondary | ICD-10-CM

## 2014-06-18 DIAGNOSIS — E785 Hyperlipidemia, unspecified: Secondary | ICD-10-CM | POA: Diagnosis not present

## 2014-06-18 DIAGNOSIS — D649 Anemia, unspecified: Secondary | ICD-10-CM

## 2014-06-18 DIAGNOSIS — R7309 Other abnormal glucose: Secondary | ICD-10-CM | POA: Diagnosis not present

## 2014-06-18 DIAGNOSIS — E663 Overweight: Secondary | ICD-10-CM

## 2014-06-18 DIAGNOSIS — Z91199 Patient's noncompliance with other medical treatment and regimen due to unspecified reason: Secondary | ICD-10-CM

## 2014-06-18 DIAGNOSIS — Z789 Other specified health status: Secondary | ICD-10-CM

## 2014-06-18 DIAGNOSIS — E8881 Metabolic syndrome: Secondary | ICD-10-CM

## 2014-06-18 DIAGNOSIS — R7303 Prediabetes: Secondary | ICD-10-CM

## 2014-06-18 LAB — LIPID PANEL
CHOL/HDL RATIO: 3.2 ratio
Cholesterol: 198 mg/dL (ref 0–200)
HDL: 62 mg/dL (ref >=46)
LDL Cholesterol: 109 mg/dL — ABNORMAL HIGH (ref 0–99)
Triglycerides: 133 mg/dL (ref <150)
VLDL: 27 mg/dL (ref 0–40)

## 2014-06-18 LAB — COMPLETE METABOLIC PANEL WITH GFR
ALT: 38 U/L — ABNORMAL HIGH (ref 0–35)
AST: 34 U/L (ref 0–37)
Albumin: 4.3 g/dL (ref 3.5–5.2)
Alkaline Phosphatase: 63 U/L (ref 39–117)
BUN: 15 mg/dL (ref 6–23)
CALCIUM: 9.5 mg/dL (ref 8.4–10.5)
CO2: 25 mEq/L (ref 19–32)
Chloride: 102 mEq/L (ref 96–112)
Creat: 0.98 mg/dL (ref 0.50–1.10)
GFR, Est African American: 68 mL/min
GFR, Est Non African American: 59 mL/min — ABNORMAL LOW
GLUCOSE: 90 mg/dL (ref 70–99)
Potassium: 4.2 mEq/L (ref 3.5–5.3)
Sodium: 141 mEq/L (ref 135–145)
TOTAL PROTEIN: 7.9 g/dL (ref 6.0–8.3)
Total Bilirubin: 0.9 mg/dL (ref 0.2–1.2)

## 2014-06-18 LAB — IRON: Iron: 94 ug/dL (ref 42–145)

## 2014-06-18 MED ORDER — SPIRONOLACTONE 25 MG PO TABS: 25.0000 mg | ORAL_TABLET | Freq: Two times a day (BID) | ORAL | Status: DC

## 2014-06-18 NOTE — Progress Notes (Signed)
Subjective:    Patient ID: Sara Haynes, female    DOB: 09/19/1945, 69 y.o.   MRN: [id number]  HPI The PT is here for follow up and re-evaluation of chronic medical conditions, medication management and review of any available recent lab and radiology data.  Preventive health is updated, specifically  Cancer screening and Immunization.    The PT does not take antihypertensive med as prescribed, insists that she gets  normal pressures a home and feels badly when she attempts to take medcations as prescribed, states she will try however  Low back pain 2 weeks ago non radiaiting relieved with tylenol 2 tabs, also intermittent  Shoulder and elbow pain  Review of Systems See HPI Denies recent fever or chills. Denies sinus pressure, nasal congestion, ear pain or sore throat. Denies chest congestion, productive cough or wheezing. Denies chest pains, palpitations and leg swelling Denies abdominal pain, nausea, vomiting,diarrhea or constipation.   Denies dysuria, frequency, hesitancy or incontinence. . Denies headaches, seizures, numbness, or tingling. Denies depression, anxiety or insomnia. Denies skin break down or rash.        Objective:   Physical Exam BP 180/110 mmHg  Pulse 96  Resp 16  Ht 5\' 6"  (1.676 m)  Wt 179 lb (81.194 kg)  BMI 28.91 kg/m2  SpO2 99%  Patient alert and oriented and in no cardiopulmonary distress.  HEENT: No facial asymmetry, EOMI,   oropharynx pink and moist.  Neck supple no JVD, no mass.  Chest: Clear to auscultation bilaterally.  CVS: S1, S2 no murmurs, no S3.Regular rate.  ABD: Soft non tender.   Ext: No edema  MS: Adequate though reduced  ROM spine, shoulders, hips and knees.  Skin: Intact, no ulcerations or rash noted.  Psych: Good eye contact, normal affect. Memory intact not anxious or depressed appearing.  CNS: CN 2-12 intact, power,  normal throughout.no focal deficits noted.        Assessment & Plan:  Malignant  hypertension Uncontrolled , reports into;lerance ,  However does not call/ come in when she stops meds, and continues to report normal bP at home, clearly denial is present  Re educate and states she will attempt to comply ith treatment plan DASH diet and commitment to daily physical activity for a minimum of 30 minutes discussed and encouraged, as a part of hypertension management. The importance of attaining a healthy weight is also discussed.  BP/Weight 06/18/2014 01/05/2014 10/02/2013 08/14/2013 05/28/2013 01/03/7508 02/06/8525  Systolic BP 782 423 536 144 315 400 867  Diastolic BP 619 509 86 326 110 79 86  Wt. (Lbs) 179 178 173 167.4 160.8 - 165  BMI 28.91 28.74 27.94 27.03 25.97 - 26.64        Prediabetes Sara Haynes is reminded of the importance of commitment to daily physical activity for 30 minutes or more, as able and the need to limit carbohydrate intake to 30 to 60 grams per meal to help with blood sugar control.   The need to take medication as prescribed, test blood sugar as directed, and to call between visits if there is a concern that blood sugar is uncontrolled is also discussed.   Sara Haynes is reminded of the importance of daily foot exam, annual eye examination, and good blood sugar, blood pressure and cholesterol control. Deteriorated  Diabetic Labs Latest Ref Rng 06/18/2014 01/10/2014 10/01/2013 05/07/2013 03/22/2013  HbA1c <5.7 % 5.7(H) 5.4 - - 5.8(H)  Chol 0 - 200 mg/dL 198 - 189 - 149  HDL >=46 mg/dL 62 - 54 - 39(L)  Calc LDL 0 - 99 mg/dL 109(H) - 119(H) - 97  Triglycerides <150 mg/dL 133 - 79 - 66  Creatinine 0.50 - 1.10 mg/dL 0.98 0.92 1.18(H) 0.87 0.75   BP/Weight 06/18/2014 01/05/2014 10/02/2013 08/14/2013 05/28/2013 1/61/0960 04/06/4096  Systolic BP 119 147 829 562 130 865 784  Diastolic BP 696 295 86 284 110 79 86  Wt. (Lbs) 179 178 173 167.4 160.8 - 165  BMI 28.91 28.74 27.94 27.03 25.97 - 26.64   No flowsheet data found.       Personal history of  noncompliance with medical treatment, presenting hazards to health Increased risk of stroke, heart failure and kidney disease due to sustained malignant hypertension with non compliance with meds, expaalined this at visit again  Medication intolerance Reports that BP meds make her feel as hough she will pass out, consistently never takes meds prescribed for this stated reason, has already been seen by cardiology , no follow up encoraged there after several failed attempts to get pt to become more compliant with treatment goal/ plan  Hyperlipidemia LDL goal <100 Hyperlipidemia:Low fat diet discussed and encouraged.   Lipid Panel  Lab Results  Component Value Date   CHOL 198 06/18/2014   HDL 62 06/18/2014   LDLCALC 109* 06/18/2014   TRIG 133 06/18/2014   CHOLHDL 3.2 06/18/2014   LDL elevated, dietary change needed      Overweight (BMI 25.0-29.9) Unchnaged Patient re-educated about  the importance of commitment to a  minimum of 150 minutes of exercise per week.  The importance of healthy food choices with portion control discussed. Encouraged to start a food diary, count calories and to consider  joining a support group. Sample diet sheets offered. Goals set by the patient for the next several months.   Weight /BMI 06/18/2014 01/05/2014 10/02/2013  WEIGHT 179 lb 178 lb 173 lb  HEIGHT 5\' 6"  5\' 6"  5\' 6"   BMI 28.91 kg/m2 28.74 kg/m2 27.94 kg/m2    Current exercise per week 132 min   Metabolic syndrome The increased risk of cardiovascular disease associated with this diagnosis, and the need to consistently work on lifestyle to change this is discussed. Following  a  heart healthy diet ,commitment to 30 minutes of exercise at least 5 days per week, as well as control of blood sugar and cholesterol , and achieving a healthy weight are all the areas to be addressed .

## 2014-06-18 NOTE — Patient Instructions (Signed)
F/u in 4 weeks, call if you need me before  Start spironolactone one tablet twice daily, 7am and 7 pm, but today first is in office  Lipid, cmp and EGfr, HBa1c ,cBc, iron and ferritin an TSH

## 2014-06-19 LAB — CBC
HEMATOCRIT: 35 % — AB (ref 36.0–46.0)
Hemoglobin: 11.4 g/dL — ABNORMAL LOW (ref 12.0–15.0)
MCH: 29.8 pg (ref 26.0–34.0)
MCHC: 32.6 g/dL (ref 30.0–36.0)
MCV: 91.6 fL (ref 78.0–100.0)
MPV: 11.4 fL (ref 8.6–12.4)
PLATELETS: 192 10*3/uL (ref 150–400)
RBC: 3.82 MIL/uL — ABNORMAL LOW (ref 3.87–5.11)
RDW: 14.1 % (ref 11.5–15.5)
WBC: 3.8 10*3/uL — ABNORMAL LOW (ref 4.0–10.5)

## 2014-06-19 LAB — HEMOGLOBIN A1C
Hgb A1c MFr Bld: 5.7 % — ABNORMAL HIGH (ref <5.7)
MEAN PLASMA GLUCOSE: 117 mg/dL — AB (ref <117)

## 2014-06-19 LAB — TSH: TSH: 1.211 u[IU]/mL (ref 0.350–4.500)

## 2014-06-19 LAB — FERRITIN: FERRITIN: 277 ng/mL (ref 10–291)

## 2014-07-06 DIAGNOSIS — E669 Obesity, unspecified: Secondary | ICD-10-CM | POA: Insufficient documentation

## 2014-07-06 DIAGNOSIS — E66811 Obesity, class 1: Secondary | ICD-10-CM | POA: Insufficient documentation

## 2014-07-06 DIAGNOSIS — E8881 Metabolic syndrome: Secondary | ICD-10-CM | POA: Insufficient documentation

## 2014-07-06 NOTE — Assessment & Plan Note (Signed)
Unchnaged Patient re-educated about  the importance of commitment to a  minimum of 150 minutes of exercise per week.  The importance of healthy food choices with portion control discussed. Encouraged to start a food diary, count calories and to consider  joining a support group. Sample diet sheets offered. Goals set by the patient for the next several months.   Weight /BMI 06/18/2014 01/05/2014 10/02/2013  WEIGHT 179 lb 178 lb 173 lb  HEIGHT 5\' 6"  5\' 6"  5\' 6"   BMI 28.91 kg/m2 28.74 kg/m2 27.94 kg/m2    Current exercise per week 120 min

## 2014-07-06 NOTE — Assessment & Plan Note (Signed)
The increased risk of cardiovascular disease associated with this diagnosis, and the need to consistently work on lifestyle to change this is discussed. Following  a  heart healthy diet ,commitment to 30 minutes of exercise at least 5 days per week, as well as control of blood sugar and cholesterol , and achieving a healthy weight are all the areas to be addressed .  

## 2014-07-06 NOTE — Assessment & Plan Note (Signed)
Uncontrolled , reports into;lerance ,  However does not call/ come in when she stops meds, and continues to report normal bP at home, clearly denial is present  Re educate and states she will attempt to comply ith treatment plan DASH diet and commitment to daily physical activity for a minimum of 30 minutes discussed and encouraged, as a part of hypertension management. The importance of attaining a healthy weight is also discussed.  BP/Weight 06/18/2014 01/05/2014 10/02/2013 08/14/2013 05/28/2013 7/68/1157 02/07/2033  Systolic BP 597 416 384 536 468 032 122  Diastolic BP 482 500 86 370 110 79 86  Wt. (Lbs) 179 178 173 167.4 160.8 - 165  BMI 28.91 28.74 27.94 27.03 25.97 - 26.64

## 2014-07-06 NOTE — Assessment & Plan Note (Signed)
Increased risk of stroke, heart failure and kidney disease due to sustained malignant hypertension with non compliance with meds, expaalined this at visit again

## 2014-07-06 NOTE — Assessment & Plan Note (Signed)
Reports that BP meds make her feel as hough she will pass out, consistently never takes meds prescribed for this stated reason, has already been seen by cardiology , no follow up encoraged there after several failed attempts to get pt to become more compliant with treatment goal/ plan

## 2014-07-06 NOTE — Assessment & Plan Note (Signed)
Hyperlipidemia:Low fat diet discussed and encouraged.   Lipid Panel  Lab Results  Component Value Date   CHOL 198 06/18/2014   HDL 62 06/18/2014   LDLCALC 109* 06/18/2014   TRIG 133 06/18/2014   CHOLHDL 3.2 06/18/2014   LDL elevated, dietary change needed

## 2014-07-06 NOTE — Assessment & Plan Note (Signed)
Sara Haynes is reminded of the importance of commitment to daily physical activity for 30 minutes or more, as able and the need to limit carbohydrate intake to 30 to 60 grams per meal to help with blood sugar control.   The need to take medication as prescribed, test blood sugar as directed, and to call between visits if there is a concern that blood sugar is uncontrolled is also discussed.   Sara Haynes is reminded of the importance of daily foot exam, annual eye examination, and good blood sugar, blood pressure and cholesterol control. Deteriorated  Diabetic Labs Latest Ref Rng 06/18/2014 01/10/2014 10/01/2013 05/07/2013 03/22/2013  HbA1c <5.7 % 5.7(H) 5.4 - - 5.8(H)  Chol 0 - 200 mg/dL 198 - 189 - 149  HDL >=46 mg/dL 62 - 54 - 39(L)  Calc LDL 0 - 99 mg/dL 109(H) - 119(H) - 97  Triglycerides <150 mg/dL 133 - 79 - 66  Creatinine 0.50 - 1.10 mg/dL 0.98 0.92 1.18(H) 0.87 0.75   BP/Weight 06/18/2014 01/05/2014 10/02/2013 08/14/2013 05/28/2013 5/72/6203 05/07/9739  Systolic BP 638 453 646 803 212 248 250  Diastolic BP 037 048 86 889 110 79 86  Wt. (Lbs) 179 178 173 167.4 160.8 - 165  BMI 28.91 28.74 27.94 27.03 25.97 - 26.64   No flowsheet data found.

## 2014-07-13 ENCOUNTER — Encounter: Payer: Self-pay | Admitting: Family Medicine

## 2014-07-13 ENCOUNTER — Ambulatory Visit (INDEPENDENT_AMBULATORY_CARE_PROVIDER_SITE_OTHER): Payer: Medicare PPO | Admitting: Family Medicine

## 2014-07-13 VITALS — BP 180/96 | HR 92 | Resp 18 | Ht 66.0 in | Wt 182.0 lb

## 2014-07-13 DIAGNOSIS — E785 Hyperlipidemia, unspecified: Secondary | ICD-10-CM

## 2014-07-13 DIAGNOSIS — Z789 Other specified health status: Secondary | ICD-10-CM

## 2014-07-13 DIAGNOSIS — Z1239 Encounter for other screening for malignant neoplasm of breast: Secondary | ICD-10-CM

## 2014-07-13 DIAGNOSIS — R7303 Prediabetes: Secondary | ICD-10-CM

## 2014-07-13 DIAGNOSIS — I10 Essential (primary) hypertension: Secondary | ICD-10-CM | POA: Diagnosis not present

## 2014-07-13 DIAGNOSIS — Z889 Allergy status to unspecified drugs, medicaments and biological substances status: Secondary | ICD-10-CM

## 2014-07-13 DIAGNOSIS — Z9119 Patient's noncompliance with other medical treatment and regimen: Secondary | ICD-10-CM

## 2014-07-13 DIAGNOSIS — E663 Overweight: Secondary | ICD-10-CM

## 2014-07-13 DIAGNOSIS — R7309 Other abnormal glucose: Secondary | ICD-10-CM

## 2014-07-13 DIAGNOSIS — Z91199 Patient's noncompliance with other medical treatment and regimen due to unspecified reason: Secondary | ICD-10-CM

## 2014-07-13 NOTE — Assessment & Plan Note (Signed)
Uncontrolled and pt reports intolerance of medication DASH diet and commitment to daily physical activity for a minimum of 30 minutes discussed and encouraged, as a part of hypertension management. The importance of attaining a healthy weight is also discussed.  BP/Weight 07/13/2014 06/18/2014 01/05/2014 10/02/2013 08/14/2013 05/28/2013 07/14/4578  Systolic BP 998 338 250 539 767 341 937  Diastolic BP 96 902 409 86 108 110 79  Wt. (Lbs) 182.04 179 178 173 167.4 160.8 -  BMI 29.4 28.91 28.74 27.94 27.03 25.97 -      Will take spironolactone twice daily

## 2014-07-13 NOTE — Assessment & Plan Note (Signed)
Deteriorated. Patient re-educated about  the importance of commitment to a  minimum of 150 minutes of exercise per week.  The importance of healthy food choices with portion control discussed. Encouraged to start a food diary, count calories and to consider  joining a support group. Sample diet sheets offered. Goals set by the patient for the next several months.   Weight /BMI 07/13/2014 06/18/2014 01/05/2014  WEIGHT 182 lb 0.6 oz 179 lb 178 lb  HEIGHT 5\' 6"  5\' 6"  5\' 6"   BMI 29.4 kg/m2 28.91 kg/m2 28.74 kg/m2    Current exercise per week  60 min

## 2014-07-13 NOTE — Progress Notes (Signed)
Subjective:    Patient ID: Sara Haynes, female    DOB: [birth year], 69 y.o.   MRN: [id number]  HPI The PT is here for follow up and re-evaluation of chronic medical conditions, medication management and review of any available recent lab and radiology data.  Preventive health is updated, specifically  Cancer screening and Immunization.  Needs mammogram The PT reports intolerance to am;lodipine, has not been taking this, and does not take spironolactone as directed , sometimes mises a dose No regular exercise with weight gain    Review of Systems See HPI Denies recent fever or chills. Denies sinus pressure, nasal congestion, ear pain or sore throat. Denies chest congestion, productive cough or wheezing. Denies chest pains, palpitations and leg swelling Denies abdominal pain, nausea, vomiting,diarrhea or constipation.   Denies dysuria, frequency, hesitancy or incontinence. Denies joint pain, swelling and limitation in mobility. Denies headaches, seizures, numbness, or tingling. Denies depression, anxiety or insomnia. Denies skin break down or rash.        Objective:   Physical Exam  BP 180/96 mmHg  Pulse 92  Resp 18  Ht 5\' 6"  (1.676 m)  Wt 182 lb 0.6 oz (82.573 kg)  BMI 29.40 kg/m2  SpO2 98% Patient alert and oriented and in no cardiopulmonary distress.  HEENT: No facial asymmetry, EOMI,   oropharynx pink and moist.  Neck supple no JVD, no mass.  Chest: Clear to auscultation bilaterally.  CVS: S1, S2 no murmurs, no S3.Regular rate.  ABD: Soft non tender.   Ext: No edema  MS: Adequate ROM spine, shoulders, hips and knees.  Skin: Intact, no ulcerations or rash noted.  Psych: Good eye contact, normal affect. Memory intact not anxious or depressed appearing.  CNS: CN 2-12 intact, power,  normal throughout.no focal deficits noted.       Assessment & Plan:  Malignant hypertension Uncontrolled and pt reports intolerance of medication DASH diet and  commitment to daily physical activity for a minimum of 30 minutes discussed and encouraged, as a part of hypertension management. The importance of attaining a healthy weight is also discussed.  BP/Weight 07/13/2014 06/18/2014 01/05/2014 10/02/2013 08/14/2013 05/28/2013 2/70/3500  Systolic BP 938 182 993 716 967 893 810  Diastolic BP 96 175 102 86 108 110 79  Wt. (Lbs) 182.04 179 178 173 167.4 160.8 -  BMI 29.4 28.91 28.74 27.94 27.03 25.97 -      Will take spironolactone twice daily   Overweight (BMI 25.0-29.9) Deteriorated. Patient re-educated about  the importance of commitment to a  minimum of 150 minutes of exercise per week.  The importance of healthy food choices with portion control discussed. Encouraged to start a food diary, count calories and to consider  joining a support group. Sample diet sheets offered. Goals set by the patient for the next several months.   Weight /BMI 07/13/2014 06/18/2014 01/05/2014  WEIGHT 182 lb 0.6 oz 179 lb 178 lb  HEIGHT 5\' 6"  5\' 6"  5\' 6"   BMI 29.4 kg/m2 28.91 kg/m2 28.74 kg/m2    Current exercise per week  60 min   Personal history of noncompliance with medical treatment, presenting hazards to health An ongoing problem, however, will leave pt to try to work on her blood pressure with lifestyle as well as medication and weight loss  Medication intolerance Ongoing reported problem, though amlodipine was prescribed in low dose , she has not been taking it   Prediabetes Patient educated about the importance of limiting  Carbohydrate intake , the  need to commit to daily physical activity for a minimum of 30 minutes , and to commit weight loss. The fact that changes in all these areas will reduce or eliminate all together the development of diabetes is stressed.  Updated lab needed at/ before next visit.   Diabetic Labs Latest Ref Rng 06/18/2014 01/10/2014 10/01/2013 05/07/2013 03/22/2013  HbA1c <5.7 % 5.7(H) 5.4 - - 5.8(H)  Chol 0 - 200 mg/dL 198 - 189  - 149  HDL >=46 mg/dL 62 - 54 - 39(L)  Calc LDL 0 - 99 mg/dL 109(H) - 119(H) - 97  Triglycerides <150 mg/dL 133 - 79 - 66  Creatinine 0.50 - 1.10 mg/dL 0.98 0.92 1.18(H) 0.87 0.75   BP/Weight 07/13/2014 06/18/2014 01/05/2014 10/02/2013 08/14/2013 05/28/2013 8/45/3646  Systolic BP 803 212 248 250 037 048 889  Diastolic BP 96 169 450 86 108 110 79  Wt. (Lbs) 182.04 179 178 173 167.4 160.8 -  BMI 29.4 28.91 28.74 27.94 27.03 25.97 -   No flowsheet data found.

## 2014-07-13 NOTE — Assessment & Plan Note (Signed)
An ongoing problem, however, will leave pt to try to work on her blood pressure with lifestyle as well as medication and weight loss

## 2014-07-13 NOTE — Patient Instructions (Signed)
Annual wellness early November, call if you need me before   Blood pressure still very high, stay on medication you are taking please  PLS call and schedule your mammogram   Commit to walking daily for 30 minutes   Commit to more vegetable , and less starchy foods  Commit to weight loss  HBA1C, fasting lipid, chem 7 and EGFR in November  No significant abnormality on left forearm     

## 2014-07-13 NOTE — Assessment & Plan Note (Signed)
Ongoing reported problem, though amlodipine was prescribed in low dose , she has not been taking it

## 2014-07-13 NOTE — Assessment & Plan Note (Addendum)
Patient educated about the importance of limiting  Carbohydrate intake , the need to commit to daily physical activity for a minimum of 30 minutes , and to commit weight loss. The fact that changes in all these areas will reduce or eliminate all together the development of diabetes is stressed.  Updated lab needed at/ before next visit.   Diabetic Labs Latest Ref Rng 06/18/2014 01/10/2014 10/01/2013 05/07/2013 03/22/2013  HbA1c <5.7 % 5.7(H) 5.4 - - 5.8(H)  Chol 0 - 200 mg/dL 198 - 189 - 149  HDL >=46 mg/dL 62 - 54 - 39(L)  Calc LDL 0 - 99 mg/dL 109(H) - 119(H) - 97  Triglycerides <150 mg/dL 133 - 79 - 66  Creatinine 0.50 - 1.10 mg/dL 0.98 0.92 1.18(H) 0.87 0.75   BP/Weight 07/13/2014 06/18/2014 01/05/2014 10/02/2013 08/14/2013 05/28/2013 2/50/0370  Systolic BP 488 891 694 503 888 280 034  Diastolic BP 96 917 915 86 108 110 79  Wt. (Lbs) 182.04 179 178 173 167.4 160.8 -  BMI 29.4 28.91 28.74 27.94 27.03 25.97 -   No flowsheet data found.

## 2014-11-30 ENCOUNTER — Other Ambulatory Visit: Payer: Self-pay | Admitting: Family Medicine

## 2014-11-30 DIAGNOSIS — Z1231 Encounter for screening mammogram for malignant neoplasm of breast: Secondary | ICD-10-CM

## 2014-12-01 LAB — LIPID PANEL
Cholesterol: 226 mg/dL — ABNORMAL HIGH (ref 125–200)
HDL: 62 mg/dL (ref >=46)
LDL Cholesterol: 148 mg/dL — ABNORMAL HIGH (ref <130)
TRIGLYCERIDES: 79 mg/dL (ref <150)
Total CHOL/HDL Ratio: 3.6 Ratio (ref <=5.0)
VLDL: 16 mg/dL (ref <30)

## 2014-12-01 LAB — BASIC METABOLIC PANEL WITH GFR
BUN: 27 mg/dL — AB (ref 7–25)
CALCIUM: 10.1 mg/dL (ref 8.6–10.4)
CO2: 28 mmol/L (ref 20–31)
Chloride: 104 mmol/L (ref 98–110)
Creat: 1.09 mg/dL — ABNORMAL HIGH (ref 0.60–0.93)
GFR, Est African American: 59 mL/min — ABNORMAL LOW (ref >=60)
GFR, Est Non African American: 52 mL/min — ABNORMAL LOW (ref >=60)
Glucose, Bld: 105 mg/dL — ABNORMAL HIGH (ref 65–99)
Potassium: 4.5 mmol/L (ref 3.5–5.3)
Sodium: 138 mmol/L (ref 135–146)

## 2014-12-01 LAB — HEMOGLOBIN A1C
Hgb A1c MFr Bld: 5.9 % — ABNORMAL HIGH (ref <5.7)
Mean Plasma Glucose: 123 mg/dL — ABNORMAL HIGH (ref <117)

## 2014-12-03 ENCOUNTER — Ambulatory Visit (HOSPITAL_COMMUNITY)
Admission: RE | Admit: 2014-12-03 | Discharge: 2014-12-03 | Disposition: A | Discharge status: Home / Self Care | Payer: Medicare PPO | Source: Ambulatory Visit | Attending: Family Medicine | Admitting: Family Medicine

## 2014-12-03 DIAGNOSIS — Z1231 Encounter for screening mammogram for malignant neoplasm of breast: Secondary | ICD-10-CM | POA: Diagnosis present

## 2014-12-15 ENCOUNTER — Encounter: Payer: Self-pay | Admitting: Family Medicine

## 2014-12-15 ENCOUNTER — Ambulatory Visit (INDEPENDENT_AMBULATORY_CARE_PROVIDER_SITE_OTHER): Payer: Medicare PPO | Admitting: Family Medicine

## 2014-12-15 VITALS — BP 180/100 | HR 74 | Resp 16 | Ht 66.75 in | Wt 193.0 lb

## 2014-12-15 DIAGNOSIS — R7303 Prediabetes: Secondary | ICD-10-CM

## 2014-12-15 DIAGNOSIS — Z0000 Encounter for general adult medical examination without abnormal findings: Secondary | ICD-10-CM | POA: Diagnosis not present

## 2014-12-15 DIAGNOSIS — I10 Essential (primary) hypertension: Secondary | ICD-10-CM

## 2014-12-15 DIAGNOSIS — Z23 Encounter for immunization: Secondary | ICD-10-CM | POA: Diagnosis not present

## 2014-12-15 DIAGNOSIS — E785 Hyperlipidemia, unspecified: Secondary | ICD-10-CM

## 2014-12-15 DIAGNOSIS — Z1159 Encounter for screening for other viral diseases: Secondary | ICD-10-CM

## 2014-12-15 NOTE — Patient Instructions (Addendum)
Annual physical exam March 25 or after , call if you need me sooner  Flu vaccine today  Take blood pressure medication as prescribed this is high  Change eating habits, no sweet drinks because you need lose weight  Fasting lipid, cmp andn RGFr, hBA1C and Hep c screen March 17 or after  Please work on good  health habits so that your health will improve. 1. Commitment to daily physical activity for 30 to 60  minutes, if you are able to do this.  2. Commitment to wise food choices. Aim for half of your  food intake to be vegetable and fruit, one quarter starchy foods, and one quarter protein. Try to eat on a regular schedule  3 meals per day, snacking between meals should be limited to vegetables or fruits or small portions of nuts. 64 ounces of water per day is generally recommended, unless you have specific health conditions, like heart failure or kidney failure where you will need to limit fluid intake.  3. Commitment to sufficient and a  good quality of physical and mental rest daily, generally between 6 to 8 hours per day.  WITH PERSISTANCE AND PERSEVERANCE, THE IMPOSSIBLE , BECOMES THE NORM!   Thanks for choosing [facility], we consider it a privelige to serve you.   All the best for 2017!

## 2014-12-15 NOTE — Assessment & Plan Note (Signed)
After obtaining informed consent, the vaccine is  administered by LPN.  

## 2014-12-15 NOTE — Progress Notes (Signed)
Subjective:    Patient ID: Sara Haynes, female    DOB: 1945/07/19, 69 y.o.   MRN: [id number]  HPI  Preventive Screening-Counseling & Management   Patient present here today for a Medicare annual wellness visit.   Current Problems (verified)   Medications Prior to Visit Allergies (verified)   PAST HISTORY  Family History (verified)   Social History Married for 51 years, 3 children, retired from [employer] work at [employer]. Never smoker   Risk Factors  Current exercise habits:  Once or twice a week she does exercise tapes at the house. Wants to start going to the [facility]   Dietary issues discussed: Heart healthy diet discussed, limits fried foods eat more boiled baked and limits carbs    Cardiac risk factors: Htn   Depression Screen  (Note: if answer to either of the following is "Yes", a more complete depression screening is indicated)   Over the past two weeks, have you felt down, depressed or hopeless? No  Over the past two weeks, have you felt little interest or pleasure in doing things? No  Have you lost interest or pleasure in daily life? No  Do you often feel hopeless? No  Do you cry easily over simple problems? No   Activities of Daily Living  In your present state of health, do you have any difficulty performing the following activities?  Driving?: No Managing money?: No Feeding yourself?:No Getting from bed to chair?:No Climbing a flight of stairs?:No Preparing food and eating?:No Bathing or showering?:No Getting dressed?:No Getting to the toilet?:No Using the toilet?:No Moving around from place to place?: No  Fall Risk Assessment In the past year have you fallen or had a near fall?:No Are you currently taking any medications that make you dizzy?:No   Hearing Difficulties: No Do you often ask people to speak up or repeat themselves?:No Do you experience ringing or noises in your ears?:has ringing in both ears  Do you have difficulty understanding  soft or whispered voices?:No  Cognitive Testing  Alert? Yes Normal Appearance?Yes  Oriented to person? Yes Place? Yes  Time? Yes  Displays appropriate judgment?Yes  Can read the correct time from a watch face? yes Are you having problems remembering things?No  Advanced Directives have been discussed with the patient?Yes, full code, still has brochure that was given on advanced directives    List the Names of Other Physician/Practitioners you currently use:    Indicate any recent Medical Services you may have received from other than [facility] providers in the past year (date may be approximate).   Assessment:    Annual Wellness Exam   Plan:    .  Medicare Attestation  I have personally reviewed:  The patient's medical and social history  Their use of alcohol, tobacco or illicit drugs  Their current medications and supplements  The patient's functional ability including ADLs,fall risks, home safety risks, cognitive, and hearing and visual impairment  Diet and physical activities  Evidence for depression or mood disorders  The patient's weight, height, BMI, and visual acuity have been recorded in the chart. I have made referrals, counseling, and provided education to the patient based on review of the above and I have provided the patient with a written personalized care plan for preventive services.     Review of Systems     Objective:   Physical Exam  BP 180/100 mmHg  Pulse 74  Resp 16  Ht 5' 6.75" (1.695 m)  Wt 193 lb (87.544 kg)  BMI 30.47 kg/m2  SpO2 97%       Assessment & Plan:  Medicare annual wellness visit, subsequent Annual exam as documented. Counseling done  re healthy lifestyle involving commitment to 150 minutes exercise per week, heart healthy diet, and attaining healthy weight.The importance of adequate sleep also discussed. Regular seat belt use and home safety, is also discussed. Changes in health habits are decided on by the patient with goals  and time frames  set for achieving them. Immunization and cancer screening needs are specifically addressed at this visit.   Need for prophylactic vaccination and inoculation against influenza After obtaining informed consent, the vaccine is  administered by LPN.

## 2014-12-15 NOTE — Assessment & Plan Note (Signed)

## 2015-02-08 ENCOUNTER — Other Ambulatory Visit: Payer: Self-pay

## 2015-02-08 DIAGNOSIS — I10 Essential (primary) hypertension: Secondary | ICD-10-CM

## 2015-02-08 MED ORDER — SPIRONOLACTONE 25 MG PO TABS: 25.0000 mg | ORAL_TABLET | Freq: Two times a day (BID) | ORAL | Status: DC

## 2015-02-27 DIAGNOSIS — I10 Essential (primary) hypertension: Secondary | ICD-10-CM | POA: Diagnosis not present

## 2015-02-27 DIAGNOSIS — Z6830 Body mass index (BMI) 30.0-30.9, adult: Secondary | ICD-10-CM | POA: Diagnosis not present

## 2015-03-23 DIAGNOSIS — R7303 Prediabetes: Secondary | ICD-10-CM | POA: Diagnosis not present

## 2015-03-23 DIAGNOSIS — R7309 Other abnormal glucose: Secondary | ICD-10-CM | POA: Diagnosis not present

## 2015-03-23 DIAGNOSIS — I10 Essential (primary) hypertension: Secondary | ICD-10-CM | POA: Diagnosis not present

## 2015-03-23 DIAGNOSIS — E785 Hyperlipidemia, unspecified: Secondary | ICD-10-CM | POA: Diagnosis not present

## 2015-03-23 DIAGNOSIS — Z1159 Encounter for screening for other viral diseases: Secondary | ICD-10-CM | POA: Diagnosis not present

## 2015-03-24 LAB — COMPLETE METABOLIC PANEL WITH GFR
ALBUMIN: 4.5 g/dL (ref 3.6–5.1)
ALK PHOS: 66 U/L (ref 33–130)
ALT: 16 U/L (ref 6–29)
AST: 18 U/L (ref 10–35)
BILIRUBIN TOTAL: 0.7 mg/dL (ref 0.2–1.2)
BUN: 16 mg/dL (ref 7–25)
CALCIUM: 9.7 mg/dL (ref 8.6–10.4)
CO2: 28 mmol/L (ref 20–31)
CREATININE: 0.89 mg/dL (ref 0.60–0.93)
Chloride: 104 mmol/L (ref 98–110)
GFR, Est African American: 76 mL/min (ref >=60)
GFR, Est Non African American: 66 mL/min (ref >=60)
Glucose, Bld: 103 mg/dL — ABNORMAL HIGH (ref 65–99)
Potassium: 4.3 mmol/L (ref 3.5–5.3)
Sodium: 143 mmol/L (ref 135–146)
Total Protein: 7.7 g/dL (ref 6.1–8.1)

## 2015-03-24 LAB — LIPID PANEL
Cholesterol: 215 mg/dL — ABNORMAL HIGH (ref 125–200)
HDL: 61 mg/dL (ref >=46)
LDL CALC: 130 mg/dL — AB (ref <130)
TRIGLYCERIDES: 121 mg/dL (ref <150)
Total CHOL/HDL Ratio: 3.5 Ratio (ref <=5.0)
VLDL: 24 mg/dL (ref <30)

## 2015-03-24 LAB — HEMOGLOBIN A1C
HEMOGLOBIN A1C: 5.6 % (ref <5.7)
Mean Plasma Glucose: 114 mg/dL (ref <117)

## 2015-03-24 LAB — HEPATITIS C ANTIBODY: HCV AB: NEGATIVE

## 2015-03-29 ENCOUNTER — Ambulatory Visit (INDEPENDENT_AMBULATORY_CARE_PROVIDER_SITE_OTHER): Payer: Commercial Managed Care - HMO | Admitting: Family Medicine

## 2015-03-29 ENCOUNTER — Encounter: Payer: Self-pay | Admitting: Family Medicine

## 2015-03-29 VITALS — BP 162/108 | HR 92 | Resp 16 | Ht 67.0 in | Wt 189.0 lb

## 2015-03-29 DIAGNOSIS — I10 Essential (primary) hypertension: Secondary | ICD-10-CM | POA: Diagnosis not present

## 2015-03-29 DIAGNOSIS — E785 Hyperlipidemia, unspecified: Secondary | ICD-10-CM | POA: Diagnosis not present

## 2015-03-29 DIAGNOSIS — Z1211 Encounter for screening for malignant neoplasm of colon: Secondary | ICD-10-CM

## 2015-03-29 DIAGNOSIS — R7303 Prediabetes: Secondary | ICD-10-CM | POA: Diagnosis not present

## 2015-03-29 DIAGNOSIS — Z0000 Encounter for general adult medical examination without abnormal findings: Secondary | ICD-10-CM | POA: Diagnosis not present

## 2015-03-29 DIAGNOSIS — Z9119 Patient's noncompliance with other medical treatment and regimen: Secondary | ICD-10-CM

## 2015-03-29 DIAGNOSIS — Z91199 Patient's noncompliance with other medical treatment and regimen due to unspecified reason: Secondary | ICD-10-CM

## 2015-03-29 LAB — POC HEMOCCULT BLD/STL (OFFICE/1-CARD/DIAGNOSTIC): FECAL OCCULT BLD: NEGATIVE

## 2015-03-29 MED ORDER — HYDRALAZINE HCL 25 MG PO TABS: 25.0000 mg | ORAL_TABLET | Freq: Three times a day (TID) | ORAL | Status: DC

## 2015-03-29 NOTE — Patient Instructions (Signed)
Nurse BP check in 6 weeks  M,D follow up in 10 weeks  New med for BP, hydrallazine 25 mg , take at 8 am, 3 pm and 10 pm  Follow DASH diet, mainly fresh or frozen fruit and vegetable  Blood sugar is now normal and cholesterol has improved , excellent  Take aspirin 81 mg every day to lower stroke risk  Thanks for choosing [facility], we consider it a privelige to serve you.

## 2015-03-29 NOTE — Assessment & Plan Note (Signed)
Improved Hyperlipidemia:Low fat diet discussed and encouraged.   Lipid Panel  Lab Results  Component Value Date   CHOL 215* 03/23/2015   HDL 61 03/23/2015   LDLCALC 130* 03/23/2015   TRIG 121 03/23/2015   CHOLHDL 3.5 03/23/2015   Still not at goal

## 2015-03-29 NOTE — Assessment & Plan Note (Signed)
Consistently elevated blood pressure, stops medication due to "intolerance" and does not check in to let me know, ongoing pattern of behavior to present time

## 2015-03-29 NOTE — Assessment & Plan Note (Signed)
Currently on no medication, states gets leg cramps, wants to try another med DASH diet and commitment to daily physical activity for a minimum of 30 minutes discussed and encouraged, as a part of hypertension management. The importance of attaining a healthy weight is also discussed.  BP/Weight 03/29/2015 12/15/2014 07/13/2014 06/18/2014 01/05/2014 10/02/2013 0000000  Systolic BP 0000000 99991111 [id number] [id number] 99991111 0000000 99991111  Diastolic BP 123XX123 123XX123 96 [id number] 108 86 108  Wt. (Lbs) 189 193 182.04 179 178 173 167.4  BMI 29.59 30.47 29.4 28.91 28.74 27.94 27.03   Refuses cardiology re eval currently

## 2015-03-29 NOTE — Assessment & Plan Note (Signed)

## 2015-03-29 NOTE — Progress Notes (Signed)
Subjective:    Patient ID: Sara Haynes, female    DOB: 05/10/1945, 70 y.o.   MRN: [id number]  HPI Patient is in for annual physical exam. Uncontrolled hypertension and intolerance to prescribed medication is addressed. Denies chest pain, PND, palpitation, orthopnea or leg swelling. C/o muscle cramps with spironolactone so topped this also. States does not want to return to cardiology for help with BP , but wants to "try another medication" Recent labs, are reviewed. Immunization is reviewed , and  updated if needed.    Review of Systems See HPI     Objective:   Physical Exam BP 162/108 mmHg  Pulse 92  Resp 16  Ht 5\' 7"  (1.702 m)  Wt 189 lb (85.73 kg)  BMI 29.59 kg/m2  SpO2 99%  Pleasant well nourished female, alert and oriented x 3, in no cardio-pulmonary distress. Afebrile. HEENT No facial trauma or asymetry. Sinuses non tender.  Extra occullar muscles intact,  External ears normal, tympanic membranes clear. Oropharynx moist, no exudate, fairly  good dentition. Neck: supple, no adenopathy,JVD or thyromegaly.No bruits.  Chest: Clear to ascultation bilaterally.No crackles or wheezes. Non tender to palpation  Breast: No asymetry,no masses or lumps. No tenderness. No nipple discharge or inversion. No axillary or supraclavicular adenopathy  Cardiovascular system; Heart sounds normal,  S1 and  S2 ,no S3.  No murmur, or thrill. Apical beat not displaced Peripheral pulses normal.  Abdomen: Soft, non tender, no organomegaly or masses. No bruits. Bowel sounds normal. No guarding, tenderness or rebound.  Rectal:  Normal sphincter tone. No mass.No rectal masses.  Guaiac negative stool.  GU: External genitalia normal female genitalia , female distribution of hair. No lesions. Urethral meatus normal in size, no  Prolapse, no lesions visibly  Present. Bladder non tender. Vagina pink and moist , with no visible lesions , discharge present . Adequate pelvic  support no  cystocele or rectocele noted Cervix pink and appears healthy, no lesions or ulcerations noted, no discharge noted from os Uterus normal size, no adnexal masses, no cervical motion or adnexal tenderness.   Musculoskeletal exam: Full ROM of spine, hips , shoulders and knees. No deformity ,swelling or crepitus noted. No muscle wasting or atrophy.   Neurologic: Cranial nerves 2 to 12 intact. Power, tone ,sensation and reflexes normal throughout. No disturbance in gait. No tremor.  Skin: Intact, no ulceration, erythema , scaling or rash noted. Pigmentation normal throughout  Psych; Normal mood and affect. Judgement and concentration normal        Assessment & Plan:  Malignant hypertension Currently on no medication, states gets leg cramps, wants to try another med DASH diet and commitment to daily physical activity for a minimum of 30 minutes discussed and encouraged, as a part of hypertension management. The importance of attaining a healthy weight is also discussed.  BP/Weight 03/29/2015 12/15/2014 07/13/2014 06/18/2014 01/05/2014 10/02/2013 0000000  Systolic BP 0000000 99991111 99991111 99991111 99991111 0000000 99991111  Diastolic BP 123XX123 123XX123 96 A999333 108 86 108  Wt. (Lbs) 189 193 182.04 179 178 173 167.4  BMI 29.59 30.47 29.4 28.91 28.74 27.94 27.03   Refuses cardiology re eval currently    Annual physical exam Annual exam as documented. Counseling done  re healthy lifestyle involving commitment to 150 minutes exercise per week, heart healthy diet, and attaining healthy weight.The importance of adequate sleep also discussed. Regular seat belt use and home safety, is also discussed. Changes in health habits are decided on by the patient with goals and  time frames  set for achieving them. Immunization and cancer screening needs are specifically addressed at this visit.   Hyperlipidemia LDL goal <100 Improved Hyperlipidemia:Low fat diet discussed and encouraged.   Lipid Panel  Lab Results    Component Value Date   CHOL 215* 03/23/2015   HDL 61 03/23/2015   LDLCALC 130* 03/23/2015   TRIG 121 03/23/2015   CHOLHDL 3.5 03/23/2015   Still not at goal    Prediabetes Improved and normalized which is excellent, pt applauded on this  Personal history of noncompliance with medical treatment, presenting hazards to health Consistently elevated blood pressure, stops medication due to "intolerance" and does not check in to let me know, ongoing pattern of behavior to present time

## 2015-03-29 NOTE — Assessment & Plan Note (Signed)
Improved and normalized which is excellent, pt applauded on this

## 2015-06-14 ENCOUNTER — Ambulatory Visit (INDEPENDENT_AMBULATORY_CARE_PROVIDER_SITE_OTHER): Payer: Commercial Managed Care - HMO | Admitting: Family Medicine

## 2015-06-14 ENCOUNTER — Encounter: Payer: Self-pay | Admitting: Family Medicine

## 2015-06-14 VITALS — BP 180/100 | HR 71 | Resp 16 | Ht 67.0 in | Wt 190.1 lb

## 2015-06-14 DIAGNOSIS — I10 Essential (primary) hypertension: Secondary | ICD-10-CM | POA: Diagnosis not present

## 2015-06-14 DIAGNOSIS — R7303 Prediabetes: Secondary | ICD-10-CM | POA: Diagnosis not present

## 2015-06-14 DIAGNOSIS — J029 Acute pharyngitis, unspecified: Secondary | ICD-10-CM | POA: Diagnosis not present

## 2015-06-14 DIAGNOSIS — E785 Hyperlipidemia, unspecified: Secondary | ICD-10-CM

## 2015-06-14 DIAGNOSIS — Z9119 Patient's noncompliance with other medical treatment and regimen: Secondary | ICD-10-CM

## 2015-06-14 DIAGNOSIS — Z91199 Patient's noncompliance with other medical treatment and regimen due to unspecified reason: Secondary | ICD-10-CM

## 2015-06-14 DIAGNOSIS — E8881 Metabolic syndrome: Secondary | ICD-10-CM

## 2015-06-14 DIAGNOSIS — E663 Overweight: Secondary | ICD-10-CM

## 2015-06-14 LAB — POCT RAPID STREP A (OFFICE): RAPID STREP A SCREEN: NEGATIVE

## 2015-06-14 NOTE — Assessment & Plan Note (Signed)
Rapid strep normal, also her exam is normal, reassured of lack of infection, salt water gargle and honey

## 2015-06-14 NOTE — Patient Instructions (Signed)
F/u in 4 month, call if you need me before  Please commit to hydralazine twice daily for next 2 weeks, 7am and 7 pm every day   Week 3 please commit to hydralazine 7am, 3pm and 11pm  Please work on good  health habits so that your health will improve. 1. Commitment to daily physical activity for 30 to 60  minutes, if you are able to do this.  2. Commitment to wise food choices. Aim for half of your  food intake to be vegetable and fruit, one quarter starchy foods, and one quarter protein. Try to eat on a regular schedule  3 meals per day, snacking between meals should be limited to vegetables or fruits or small portions of nuts. 64 ounces of water per day is generally recommended, unless you have specific health conditions, like heart failure or kidney failure where you will need to limit fluid intake.  3. Commitment to sufficient and a  good quality of physical and mental rest daily, generally between 6 to 8 hours per day.  WITH PERSISTANCE AND PERSEVERANCE, THE IMPOSSIBLE , BECOMES THE NORM! Thank you  for choosing [facility]. We consider it a privelige to serve you.  Delivering excellent health care in a caring and  compassionate way is our goal.  Partnering with you,  so that together we can achieve this goal is our strategy.  Fasting lipid, cbc, chem 7 and EGFR and HBA1C in 4 months  No infection on exam, salt WATER GARgLES AND HONEY AT NIGHT

## 2015-06-14 NOTE — Assessment & Plan Note (Signed)
Patient educated about the importance of limiting  Carbohydrate intake , the need to commit to daily physical activity for a minimum of 30 minutes , and to commit weight loss. The fact that changes in all these areas will reduce or eliminate all together the development of diabetes is stressed.   Diabetic Labs Latest Ref Rng 03/23/2015 11/30/2014 06/18/2014 01/10/2014 10/01/2013  HbA1c <5.7 % 5.6 5.9(H) 5.7(H) 5.4 -  Chol 125 - 200 mg/dL 215(H) 226(H) 198 - 189  HDL >=46 mg/dL 61 62 62 - 54  Calc LDL <130 mg/dL 130(H) 148(H) 109(H) - 119(H)  Triglycerides <150 mg/dL 121 79 133 - 79  Creatinine 0.60 - 0.93 mg/dL 0.89 1.09(H) 0.98 0.92 1.18(H)   BP/Weight 06/14/2015 03/29/2015 12/15/2014 07/13/2014 06/18/2014 01/05/2014 0000000  Systolic BP 99991111 0000000 99991111 99991111 99991111 99991111 0000000  Diastolic BP 123XX123 123XX123 123XX123 96 110 108 86  Wt. (Lbs) 190.12 189 193 182.04 179 178 173  BMI 29.77 29.59 30.47 29.4 28.91 28.74 27.94   No flowsheet data found.   Improved

## 2015-06-14 NOTE — Assessment & Plan Note (Signed)
Uncontrolled, continued non compliance with medication, continues to insist that blood pressure is normal off of medication and that she feels badly when she takes the medication Re counselled re need to take medication  DASH diet and commitment to daily physical activity for a minimum of 30 minutes discussed and encouraged, as a part of hypertension management. The importance of attaining a healthy weight is also discussed.  BP/Weight 06/14/2015 03/29/2015 12/15/2014 07/13/2014 06/18/2014 01/05/2014 0000000  Systolic BP 99991111 0000000 99991111 99991111 99991111 99991111 0000000  Diastolic BP 123XX123 123XX123 123XX123 96 110 108 86  Wt. (Lbs) 190.12 189 193 182.04 179 178 173  BMI 29.77 29.59 30.47 29.4 28.91 28.74 27.94

## 2015-06-14 NOTE — Assessment & Plan Note (Signed)
Deteriorated. Patient re-educated about  the importance of commitment to a  minimum of 150 minutes of exercise per week.  The importance of healthy food choices with portion control discussed. Encouraged to start a food diary, count calories and to consider  joining a support group. Sample diet sheets offered. Goals set by the patient for the next several months.   Weight /BMI 06/14/2015 03/29/2015 12/15/2014  WEIGHT 190 lb 1.9 oz 189 lb 193 lb  HEIGHT 5\' 7"  5\' 7"  5' 6.75"  BMI 29.77 kg/m2 29.59 kg/m2 30.47 kg/m2    Current exercise per week 120 minutes.

## 2015-06-14 NOTE — Progress Notes (Signed)
Subjective:    Patient ID: Sara Haynes, female    DOB: 08-11-1945, 70 y.o.   MRN: [id number]  HPI   Sara Haynes     MRN: [id number]      DOB: 1945-12-26   HPI [name] is here for follow up and re-evaluation of chronic medical conditions, medication management and review of any available recent lab and radiology data.  Preventive health is updated, specifically  Cancer screening and Immunization.   Questions or concerns regarding consultations or procedures which the PT has had in the interim are  addressed. The PT continues to be non compliant with her blood pressure  5 day h/o sore throat with sick contact , no fever or chills   ROS Denies recent fever or chills. Denies sinus pressure, nasal congestion, ear pain or sore throat. Denies chest congestion, productive cough or wheezing. Denies chest pains, palpitations and leg swelling Denies abdominal pain, nausea, vomiting,diarrhea or constipation.   Denies dysuria, frequency, hesitancy or incontinence. Denies joint pain, swelling and limitation in mobility. Denies headaches, seizures, numbness, or tingling. Denies depression, anxiety or insomnia. Denies skin break down or rash.   PE  BP 180/100 mmHg  Pulse 71  Resp 16  Ht 5\' 7"  (1.702 m)  Wt 190 lb 1.9 oz (86.238 kg)  BMI 29.77 kg/m2  SpO2 96%  Patient alert and oriented and in no cardiopulmonary distress.  HEENT: No facial asymmetry, EOMI,   oropharynx pink and moist.  Neck supple no JVD, no mass. TM clear, no cervical adenopathy, no exudate Chest: Clear to auscultation bilaterally.  CVS: S1, S2 no murmurs, no S3.Regular rate.  ABD: Soft non tender.   Ext: No edema  MS: Adequate ROM spine, shoulders, hips and knees.  Skin: Intact, no ulcerations or rash noted.  Psych: Good eye contact, normal affect. Memory intact not anxious or depressed appearing.  CNS: CN 2-12 intact, power,  normal throughout.no focal deficits noted.   Assessment &  Plan  Malignant hypertension Uncontrolled, continued non compliance with medication, continues to insist that blood pressure is normal off of medication and that she feels badly when she takes the medication Re counselled re need to take medication  DASH diet and commitment to daily physical activity for a minimum of 30 minutes discussed and encouraged, as a part of hypertension management. The importance of attaining a healthy weight is also discussed.  BP/Weight 06/14/2015 03/29/2015 12/15/2014 07/13/2014 06/18/2014 01/05/2014 0000000  Systolic BP 99991111 0000000 99991111 99991111 99991111 99991111 0000000  Diastolic BP 123XX123 123XX123 123XX123 96 110 108 86  Wt. (Lbs) 190.12 189 193 182.04 179 178 173  BMI 29.77 29.59 30.47 29.4 28.91 28.74 27.94        Overweight (BMI 25.0-29.9) Deteriorated. Patient re-educated about  the importance of commitment to a  minimum of 150 minutes of exercise per week.  The importance of healthy food choices with portion control discussed. Encouraged to start a food diary, count calories and to consider  joining a support group. Sample diet sheets offered. Goals set by the patient for the next several months.   Weight /BMI 06/14/2015 03/29/2015 12/15/2014  WEIGHT 190 lb 1.9 oz 189 lb 193 lb  HEIGHT 5\' 7"  5\' 7"  5' 6.75"  BMI 29.77 kg/m2 29.59 kg/m2 30.47 kg/m2    Current exercise per week 120 minutes.   Sore throat Rapid strep normal, also her exam is normal, reassured of lack of infection, salt water gargle and honey  Personal history of noncompliance  with medical treatment, presenting hazards to health Ongoing problem , re educated re need to contreol blood pressure and commit to daily aspirin, she is doing neither  Prediabetes Patient educated about the importance of limiting  Carbohydrate intake , the need to commit to daily physical activity for a minimum of 30 minutes , and to commit weight loss. The fact that changes in all these areas will reduce or eliminate all together the  development of diabetes is stressed.   Diabetic Labs Latest Ref Rng 03/23/2015 11/30/2014 06/18/2014 01/10/2014 10/01/2013  HbA1c <5.7 % 5.6 5.9(H) 5.7(H) 5.4 -  Chol 125 - 200 mg/dL 215(H) 226(H) 198 - 189  HDL >=46 mg/dL 61 62 62 - 54  Calc LDL <130 mg/dL 130(H) 148(H) 109(H) - 119(H)  Triglycerides <150 mg/dL 121 79 133 - 79  Creatinine 0.60 - 0.93 mg/dL 0.89 1.09(H) 0.98 0.92 1.18(H)   BP/Weight 06/14/2015 03/29/2015 12/15/2014 07/13/2014 06/18/2014 01/05/2014 0000000  Systolic BP 99991111 0000000 99991111 99991111 99991111 99991111 0000000  Diastolic BP 123XX123 123XX123 123XX123 96 110 108 86  Wt. (Lbs) 190.12 189 193 182.04 179 178 173  BMI 29.77 29.59 30.47 29.4 28.91 28.74 27.94   No flowsheet data found.   Improved     Hyperlipidemia LDL goal <100 Hyperlipidemia:Low fat diet discussed and encouraged.   Lipid Panel  Lab Results  Component Value Date   CHOL 215* 03/23/2015   HDL 61 03/23/2015   LDLCALC 130* 03/23/2015   TRIG 121 03/23/2015   CHOLHDL 3.5 03/23/2015   Updated lab needed at/ before next visit.      Metabolic syndrome The increased risk of cardiovascular disease associated with this diagnosis, and the need to consistently work on lifestyle to change this is discussed. Following  a  heart healthy diet ,commitment to 30 minutes of exercise at least 5 days per week, as well as control of blood sugar and cholesterol , and achieving a healthy weight are all the areas to be addressed .         Review of Systems     Objective:   Physical Exam        Assessment & Plan:

## 2015-06-14 NOTE — Assessment & Plan Note (Signed)
Hyperlipidemia:Low fat diet discussed and encouraged.   Lipid Panel  Lab Results  Component Value Date   CHOL 215* 03/23/2015   HDL 61 03/23/2015   LDLCALC 130* 03/23/2015   TRIG 121 03/23/2015   CHOLHDL 3.5 03/23/2015   Updated lab needed at/ before next visit.

## 2015-06-14 NOTE — Assessment & Plan Note (Signed)
Ongoing problem , re educated re need to contreol blood pressure and commit to daily aspirin, she is doing neither

## 2015-06-14 NOTE — Assessment & Plan Note (Signed)
The increased risk of cardiovascular disease associated with this diagnosis, and the need to consistently work on lifestyle to change this is discussed. Following  a  heart healthy diet ,commitment to 30 minutes of exercise at least 5 days per week, as well as control of blood sugar and cholesterol , and achieving a healthy weight are all the areas to be addressed .  

## 2015-09-13 ENCOUNTER — Ambulatory Visit (HOSPITAL_COMMUNITY)
Admission: RE | Admit: 2015-09-13 | Discharge: 2015-09-13 | Disposition: A | Discharge status: Home / Self Care | Payer: Commercial Managed Care - HMO | Source: Ambulatory Visit | Attending: Family Medicine | Admitting: Family Medicine

## 2015-09-13 ENCOUNTER — Ambulatory Visit (INDEPENDENT_AMBULATORY_CARE_PROVIDER_SITE_OTHER): Payer: Commercial Managed Care - HMO | Admitting: Family Medicine

## 2015-09-13 ENCOUNTER — Encounter: Payer: Self-pay | Admitting: Family Medicine

## 2015-09-13 VITALS — BP 166/100 | HR 88 | Resp 18 | Ht 67.0 in | Wt 184.1 lb

## 2015-09-13 DIAGNOSIS — M79672 Pain in left foot: Secondary | ICD-10-CM | POA: Insufficient documentation

## 2015-09-13 DIAGNOSIS — I10 Essential (primary) hypertension: Secondary | ICD-10-CM | POA: Diagnosis not present

## 2015-09-13 DIAGNOSIS — Z23 Encounter for immunization: Secondary | ICD-10-CM | POA: Diagnosis not present

## 2015-09-13 DIAGNOSIS — Z789 Other specified health status: Secondary | ICD-10-CM

## 2015-09-13 DIAGNOSIS — Z889 Allergy status to unspecified drugs, medicaments and biological substances status: Secondary | ICD-10-CM | POA: Diagnosis not present

## 2015-09-13 DIAGNOSIS — E663 Overweight: Secondary | ICD-10-CM

## 2015-09-13 MED ORDER — PREDNISONE 5 MG PO TABS: ORAL_TABLET | ORAL | 0 refills | Status: DC

## 2015-09-13 NOTE — Patient Instructions (Signed)
Cancel f/u already scheduled  Wellness Due Dec 14 or after  Medication sent for left foot pain, please get Xray of foot today  Flu vaccine today  X ray of left foot today  PLEASE work on improved blood pressure , too high  Thank you  for choosing [facility]. We consider it a privelige to serve you.  Delivering excellent health care in a caring and  compassionate way is our goal.  Partnering with you,  so that together we can achieve this goal is our strategy.

## 2015-09-13 NOTE — Assessment & Plan Note (Signed)
After obtaining informed consent, the vaccine is  administered by LPN.  

## 2015-09-13 NOTE — Assessment & Plan Note (Addendum)
5 day h/o left foot pain and swelling, get Xray, and short course prednisone, dout fracture, history and exam more consistent with gout

## 2015-09-14 NOTE — Assessment & Plan Note (Signed)
improved Patient re-educated about  the importance of commitment to a  minimum of 150 minutes of exercise per week.  The importance of healthy food choices with portion control discussed. Encouraged to start a food diary, count calories and to consider  joining a support group. Sample diet sheets offered. Goals set by the patient for the next several months.   Weight /BMI 09/13/2015 06/14/2015 03/29/2015  WEIGHT 184 lb 1.9 oz 190 lb 1.9 oz 189 lb  HEIGHT 5\' 7"  5\' 7"  5\' 7"   BMI 28.84 kg/m2 29.77 kg/m2 29.59 kg/m2

## 2015-09-14 NOTE — Assessment & Plan Note (Signed)
Ongoing complaint regarding blood pressure meds, currently on noe, with elevated bP

## 2015-09-14 NOTE — Assessment & Plan Note (Signed)
Uncontrolled, taking no medication , intolerant of "ALL mEDS" DASH diet and commitment to daily physical activity for a minimum of 30 minutes discussed and encouraged, as a part of hypertension management. The importance of attaining a healthy weight is also discussed.  BP/Weight 09/13/2015 06/14/2015 03/29/2015 12/15/2014 07/13/2014 [id number]  Systolic BP XX123456 99991111 0000000 99991111 99991111 99991111 99991111  Diastolic BP 123XX123 123XX123 123XX123 123XX123 96 110 108  Wt. (Lbs) 184.12 190.12 189 193 182.04 179 178  BMI 28.84 29.77 29.59 30.47 29.4 28.91 28.74

## 2015-09-14 NOTE — Progress Notes (Signed)
   Sara Haynes     MRN: [id number]      DOB: 08-16-1945   HPI [name] is here with a 5 day h/o left foot pain states she hit the foot, and following this she yhas had pain and swelling and redness esp in the left great toe No medication being taken at this time  ROS Denies recent fever or chills. Denies sinus pressure, nasal congestion, ear pain or sore throat. Denies chest congestion, productive cough or wheezing. Denies chest pains, palpitations and leg swelling Denies abdominal pain, nausea, vomiting,diarrhea or constipation.   Denies dysuria, frequency, hesitancy or incontinence. . Denies depression, anxiety or insomnia. Denies skin break down or rash.   PE  BP (!) 166/100   Pulse 88   Resp 18   Ht 5\' 7"  (1.702 m)   Wt 184 lb 1.9 oz (83.5 kg)   SpO2 99%   BMI 28.84 kg/m   Patient alert and oriented and in no cardiopulmonary distress.  HEENT: No facial asymmetry, EOMI,   oropharynx pink and moist.  Neck supple no JVD, no mass.  Chest: Clear to auscultation bilaterally.  CVS: S1, S2 no murmurs, no S3.Regular rate.  ABD: Soft non tender.   Ext: No edema  MS: Adequate ROM spine, shoulders, hips and knees. Left foot red and swollen esp the left great toe Skin: Intact, no ulcerations or rash noted.  Psych: Good eye contact, normal affect. Memory intact not anxious or depressed appearing.  CNS: CN 2-12 intact, power,  normal throughout.no focal deficits noted.   Assessment & Plan  Foot pain, left 5 day h/o left foot pain and swelling, get Xray, and short course prednisone, dout fracture, history and exam more consistent with gout  Need for prophylactic vaccination and inoculation against influenza After obtaining informed consent, the vaccine is  administered by LPN.   Malignant hypertension Uncontrolled, taking no medication , intolerant of "ALL mEDS" DASH diet and commitment to daily physical activity for a minimum of 30 minutes discussed and  encouraged, as a part of hypertension management. The importance of attaining a healthy weight is also discussed.  BP/Weight 09/13/2015 06/14/2015 03/29/2015 12/15/2014 07/13/2014 [id number]  Systolic BP [id number]  Diastolic BP 123XX123 123XX123 123XX123 123XX123 96 110 108  Wt. (Lbs) 184.12 190.12 189 193 182.04 179 178  BMI 28.84 29.77 29.59 30.47 29.4 28.91 28.74       Medication intolerance Ongoing complaint regarding blood pressure meds, currently on noe, with elevated bP  Overweight (BMI 25.0-29.9) improved Patient re-educated about  the importance of commitment to a  minimum of 150 minutes of exercise per week.  The importance of healthy food choices with portion control discussed. Encouraged to start a food diary, count calories and to consider  joining a support group. Sample diet sheets offered. Goals set by the patient for the next several months.   Weight /BMI 09/13/2015 06/14/2015 03/29/2015  WEIGHT 184 lb 1.9 oz 190 lb 1.9 oz 189 lb  HEIGHT 5\' 7"  5\' 7"  5\' 7"   BMI 28.84 kg/m2 29.77 kg/m2 29.59 kg/m2

## 2015-10-14 ENCOUNTER — Ambulatory Visit: Payer: Commercial Managed Care - HMO | Admitting: Family Medicine

## 2015-12-16 ENCOUNTER — Ambulatory Visit: Payer: Commercial Managed Care - HMO

## 2015-12-23 ENCOUNTER — Telehealth: Payer: Self-pay | Admitting: Family Medicine

## 2015-12-23 DIAGNOSIS — E785 Hyperlipidemia, unspecified: Secondary | ICD-10-CM | POA: Diagnosis not present

## 2015-12-23 DIAGNOSIS — R7303 Prediabetes: Secondary | ICD-10-CM

## 2015-12-23 LAB — BASIC METABOLIC PANEL
BUN: 20 mg/dL (ref 7–25)
CALCIUM: 9.3 mg/dL (ref 8.6–10.4)
CO2: 27 mmol/L (ref 20–31)
Chloride: 105 mmol/L (ref 98–110)
Creat: 0.9 mg/dL (ref 0.60–0.93)
GLUCOSE: 86 mg/dL (ref 65–99)
POTASSIUM: 4.3 mmol/L (ref 3.5–5.3)
SODIUM: 141 mmol/L (ref 135–146)

## 2015-12-23 LAB — LIPID PANEL
Cholesterol: 220 mg/dL — ABNORMAL HIGH (ref <200)
HDL: 59 mg/dL (ref >50)
LDL CALC: 137 mg/dL — AB (ref <100)
Total CHOL/HDL Ratio: 3.7 Ratio (ref <5.0)
Triglycerides: 119 mg/dL (ref <150)
VLDL: 24 mg/dL (ref <30)

## 2015-12-23 LAB — CBC
HCT: 33.6 % — ABNORMAL LOW (ref 35.0–45.0)
HEMOGLOBIN: 10.8 g/dL — AB (ref 11.7–15.5)
MCH: 29 pg (ref 27.0–33.0)
MCHC: 32.1 g/dL (ref 32.0–36.0)
MCV: 90.3 fL (ref 80.0–100.0)
MPV: 10.5 fL (ref 7.5–12.5)
PLATELETS: 199 10*3/uL (ref 140–400)
RBC: 3.72 MIL/uL — AB (ref 3.80–5.10)
RDW: 14.9 % (ref 11.0–15.0)
WBC: 4.9 10*3/uL (ref 3.8–10.8)

## 2015-12-23 LAB — HEMOGLOBIN A1C
HEMOGLOBIN A1C: 5.4 % (ref <5.7)
Mean Plasma Glucose: 108 mg/dL

## 2015-12-24 ENCOUNTER — Telehealth: Payer: Self-pay | Admitting: Family Medicine

## 2015-12-24 DIAGNOSIS — D649 Anemia, unspecified: Secondary | ICD-10-CM

## 2015-12-24 LAB — IRON: IRON: 86 ug/dL (ref 45–160)

## 2015-12-24 LAB — FERRITIN: FERRITIN: 166 ng/mL (ref 20–288)

## 2015-12-24 NOTE — Telephone Encounter (Signed)
-----   Message from Fayrene Helper, MD sent at 12/24/2015 10:16 AM EST ----- pls add anemia panel, pt needs to reduce fat in diet , cholesterol has increased, her blood sugar is normal which is very good

## 2015-12-24 NOTE — Telephone Encounter (Signed)
Orders pended, lab called.

## 2015-12-24 NOTE — Telephone Encounter (Signed)
error 

## 2015-12-24 NOTE — Telephone Encounter (Signed)
Lab called, orders pended.

## 2015-12-26 LAB — METHYLMALONIC ACID, SERUM: METHYLMALONIC ACID, QUANT: 140 nmol/L (ref 87–318)

## 2016-01-13 ENCOUNTER — Ambulatory Visit (INDEPENDENT_AMBULATORY_CARE_PROVIDER_SITE_OTHER): Payer: Medicare HMO

## 2016-01-13 ENCOUNTER — Ambulatory Visit: Payer: Commercial Managed Care - HMO

## 2016-01-13 VITALS — BP 200/100 | HR 88 | Temp 97.6°F | Ht 67.0 in | Wt 194.1 lb

## 2016-01-13 DIAGNOSIS — Z1239 Encounter for other screening for malignant neoplasm of breast: Secondary | ICD-10-CM

## 2016-01-13 DIAGNOSIS — Z0000 Encounter for general adult medical examination without abnormal findings: Secondary | ICD-10-CM

## 2016-01-13 DIAGNOSIS — Z13820 Encounter for screening for osteoporosis: Secondary | ICD-10-CM

## 2016-01-13 DIAGNOSIS — Z1231 Encounter for screening mammogram for malignant neoplasm of breast: Secondary | ICD-10-CM

## 2016-01-13 DIAGNOSIS — I10 Essential (primary) hypertension: Secondary | ICD-10-CM | POA: Diagnosis not present

## 2016-01-13 NOTE — Patient Instructions (Addendum)
Health maintenance: Due for Mammogram, ordered today   Abnormal screenings: None   Patient concerns: None  Nurse concerns: Weight, recommend increasing water intake and decreasing carbohydrates and fried/high fatty foods. Your blood pressure was elevated today. Dr. Moshe Cipro has recommended a referral to cardiology.    Next PCP appt: 04/04/2016 at 10:20 am with Dr. Moshe Cipro  Advance directive discussed with patient today. Copy provided for patient to complete at home and have notarized. Patient agrees to have copy sent to our office once it is complete.     Health Maintenance, Female Introduction Adopting a healthy lifestyle and getting preventive care can go a long way to promote health and wellness. Talk with your health care provider about what schedule of regular examinations is right for you. This is a good chance for you to check in with your provider about disease prevention and staying healthy. In between checkups, there are plenty of things you can do on your own. Experts have done a lot of research about which lifestyle changes and preventive measures are most likely to keep you healthy. Ask your health care provider for more information. Weight and diet Eat a healthy diet  Be sure to include plenty of vegetables, fruits, low-fat dairy products, and lean protein.  Do not eat a lot of foods high in solid fats, added sugars, or salt.  Get regular exercise. This is one of the most important things you can do for your health.  Most adults should exercise for at least 150 minutes each week. The exercise should increase your heart rate and make you sweat (moderate-intensity exercise).  Most adults should also do strengthening exercises at least twice a week. This is in addition to the moderate-intensity exercise. Maintain a healthy weight  Body mass index (BMI) is a measurement that can be used to identify possible weight problems. It estimates body fat based on height and weight.  Your health care provider can help determine your BMI and help you achieve or maintain a healthy weight.  For females 87 years of age and older:  A BMI below 18.5 is considered underweight.  A BMI of 18.5 to 24.9 is normal.  A BMI of 25 to 29.9 is considered overweight.  A BMI of 30 and above is considered obese. Watch levels of cholesterol and blood lipids  You should start having your blood tested for lipids and cholesterol at 72 years of age, then have this test every 5 years.  You may need to have your cholesterol levels checked more often if:  Your lipid or cholesterol levels are high.  You are older than 72 years of age.  You are at high risk for heart disease. Cancer screening Lung Cancer  Lung cancer screening is recommended for adults 23-80 years old who are at high risk for lung cancer because of a history of smoking.  A yearly low-dose CT scan of the lungs is recommended for people who:  Currently smoke.  Have quit within the past 15 years.  Have at least a 30-pack-year history of smoking. A pack year is smoking an average of one pack of cigarettes a day for 1 year.  Yearly screening should continue until it has been 15 years since you quit.  Yearly screening should stop if you develop a health problem that would prevent you from having lung cancer treatment. Breast Cancer  Practice breast self-awareness. This means understanding how your breasts normally appear and feel.  It also means doing regular breast self-exams. Let  your health care provider know about any changes, no matter how small.  If you are in your 20s or 30s, you should have a clinical breast exam (CBE) by a health care provider every 1-3 years as part of a regular health exam.  If you are 55 or older, have a CBE every year. Also consider having a breast X-ray (mammogram) every year.  If you have a family history of breast cancer, talk to your health care provider about genetic  screening.  If you are at high risk for breast cancer, talk to your health care provider about having an MRI and a mammogram every year.  Breast cancer gene (BRCA) assessment is recommended for women who have family members with BRCA-related cancers. BRCA-related cancers include:  Breast.  Ovarian.  Tubal.  Peritoneal cancers.  Results of the assessment will determine the need for genetic counseling and BRCA1 and BRCA2 testing. Colorectal Cancer  This type of cancer can be detected and often prevented.  Routine colorectal cancer screening usually begins at 72 years of age and continues through 72 years of age.  Your health care provider may recommend screening at an earlier age if you have risk factors for colon cancer.  Your health care provider may also recommend using home test kits to check for hidden blood in the stool.  A small camera at the end of a tube can be used to examine your colon directly (sigmoidoscopy or colonoscopy). This is done to check for the earliest forms of colorectal cancer.  Routine screening usually begins at age 45.  Direct examination of the colon should be repeated every 5-10 years through 72 years of age. However, you may need to be screened more often if early forms of precancerous polyps or small growths are found. Skin Cancer  Check your skin from head to toe regularly.  Tell your health care provider about any new moles or changes in moles, especially if there is a change in a mole's shape or color.  Also tell your health care provider if you have a mole that is larger than the size of a pencil eraser.  Always use sunscreen. Apply sunscreen liberally and repeatedly throughout the day.  Protect yourself by wearing long sleeves, pants, a wide-brimmed hat, and sunglasses whenever you are outside. Heart disease, diabetes, and high blood pressure  High blood pressure causes heart disease and increases the risk of stroke. High blood pressure is  more likely to develop in:  People who have blood pressure in the high end of the normal range (130-139/85-89 mm Hg).  People who are overweight or obese.  People who are African American.  If you are 67-70 years of age, have your blood pressure checked every 3-5 years. If you are 1 years of age or older, have your blood pressure checked every year. You should have your blood pressure measured twice-once when you are at a hospital or clinic, and once when you are not at a hospital or clinic. Record the average of the two measurements. To check your blood pressure when you are not at a hospital or clinic, you can use:  An automated blood pressure machine at a pharmacy.  A home blood pressure monitor.  If you are between 38 years and 39 years old, ask your health care provider if you should take aspirin to prevent strokes.  Have regular diabetes screenings. This involves taking a blood sample to check your fasting blood sugar level.  If you are at a  normal weight and have a low risk for diabetes, have this test once every three years after 72 years of age.  If you are overweight and have a high risk for diabetes, consider being tested at a younger age or more often. Preventing infection Hepatitis B  If you have a higher risk for hepatitis B, you should be screened for this virus. You are considered at high risk for hepatitis B if:  You were born in a country where hepatitis B is common. Ask your health care provider which countries are considered high risk.  Your parents were born in a high-risk country, and you have not been immunized against hepatitis B (hepatitis B vaccine).  You have HIV or AIDS.  You use needles to inject street drugs.  You live with someone who has hepatitis B.  You have had sex with someone who has hepatitis B.  You get hemodialysis treatment.  You take certain medicines for conditions, including cancer, organ transplantation, and autoimmune  conditions. Hepatitis C  Blood testing is recommended for:  Everyone born from [birth year] through 1966.  Anyone with known risk factors for hepatitis C. Osteoporosis and menopause  Osteoporosis is a disease in which the bones lose minerals and strength with aging. This can result in serious bone fractures. Your risk for osteoporosis can be identified using a bone density scan.  If you are 53 years of age or older, or if you are at risk for osteoporosis and fractures, ask your health care provider if you should be screened.  Ask your health care provider whether you should take a calcium or vitamin D supplement to lower your risk for osteoporosis.  Menopause may have certain physical symptoms and risks.  Hormone replacement therapy may reduce some of these symptoms and risks. Talk to your health care provider about whether hormone replacement therapy is right for you. Follow these instructions at home:  Schedule regular health, dental, and eye exams.  Stay current with your immunizations.  Do not use any tobacco products including cigarettes, chewing tobacco, or electronic cigarettes.  Limit alcohol intake to no more than 1 drink per day for nonpregnant women. One drink equals 12 ounces of beer, 5 ounces of wine, or 1 ounces of hard liquor.  Do not use street drugs.  Do not share needles.  Ask your health care provider for help if you need support or information about quitting drugs.  Tell your health care provider if you often feel depressed.  Tell your health care provider if you have ever been abused or do not feel safe at home. This information is not intended to replace advice given to you by your health care provider. Make sure you discuss any questions you have with your health care provider. Document Released: 07/04/2010 Document Revised: 05/27/2015 Document Reviewed: 09/22/2014  2017 Elsevier   Mammogram A mammogram is an X-ray of the breasts that is done to check for  abnormal changes. This procedure can screen for and detect any changes that may suggest breast cancer. A mammogram can also identify other changes and variations in the breast, such as:  Inflammation of the breast tissue (mastitis).  An infected area that contains a collection of pus (abscess).  A fluid-filled sac (cyst).  Fibrocystic changes. This is when breast tissue becomes denser, which can make the tissue feel rope-like or uneven under the skin.  Tumors that are not cancerous (benign). Tell a health care provider about:  Any allergies you have.  If you have  breast implants.  If you have had previous breast disease, biopsy, or surgery.  If you are breastfeeding.  Any possibility that you could be pregnant, if this applies.  If you are younger than age 22.  If you have a family history of breast cancer. What are the risks? Generally, this is a safe procedure. However, problems may occur, including:  Exposure to radiation. Radiation levels are very low with this test.  The results being misinterpreted.  The need for further tests.  The inability of the mammogram to detect certain cancers. What happens before the procedure?  Schedule your test about 1-2 weeks after your menstrual period. This is usually when your breasts are the least tender.  If you have had a mammogram done at a different facility in the past, get the mammogram X-rays or have them sent to your current exam facility in order to compare them.  Wash your breasts and under your arms the day of the test.  Do not wear deodorants, perfumes, lotions, or powders anywhere on your body on the day of the test.  Remove any jewelry from your neck.  Wear clothes that you can change into and out of easily. What happens during the procedure?  You will undress from the waist up and put on a gown.  You will stand in front of the X-ray machine.  Each breast will be placed between two plastic or glass plates.  The plates will compress your breast for a few seconds. Try to stay as relaxed as possible during the procedure. This does not cause any harm to your breasts and any discomfort you feel will be very brief.  X-rays will be taken from different angles of each breast. The procedure may vary among health care providers and hospitals. What happens after the procedure?  The mammogram will be examined by a specialist (radiologist).  You may need to repeat certain parts of the test, depending on the quality of the images. This is commonly done if the radiologist needs a better view of the breast tissue.  Ask when your test results will be ready. Make sure you get your test results.  You may resume your normal activities. This information is not intended to replace advice given to you by your health care provider. Make sure you discuss any questions you have with your health care provider. Document Released: 12/17/1999 Document Revised: 05/24/2015 Document Reviewed: 02/27/2014 Elsevier Interactive Patient Education  [employer]. It is important to avoid accidents which may result in broken bones.  Here are a few ideas on how to make your home safer so you will be less likely to trip or fall.  1. Use nonskid mats or non slip strips in your shower or tub, on your bathroom floor and around sinks.  If you know that you have spilled water, wipe it up! 2. In the bathroom, it is important to have properly installed grab bars on the walls or on the edge of the tub.  Towel racks are NOT strong enough for you to hold onto or to pull on for support. 3. Stairs and hallways should have enough light.  Add lamps or night lights if you need ore light. 4. It is good to have handrails on both sides of the stairs if possible.  Always fix broken handrails right away. 5. It is important to see the edges of steps.  Paint the edges of outdoor steps white so you can see them better.  Put colored tape  on the edge of  inside steps. 6. Throw-rugs are dangerous because they can slide.  Removing the rugs is the best idea, but if they must stay, add adhesive carpet tape to prevent slipping. 7. Do not keep things on stairs or in the halls.  Remove small furniture that blocks the halls as it may cause you to trip.  Keep telephone and electrical cords out of the way where you walk. 8. Always were sturdy, rubber-soled shoes for good support.  Never wear just socks, especially on the stairs.  Socks may cause you to slip or fall.  Do not wear full-length housecoats as you can easily trip on the bottom.  9. Place the things you use the most on the shelves that are the easiest to reach.  If you use a stepstool, make sure it is in good condition.  If you feel unsteady, DO NOT climb, ask for help. 10. If a health professional advises you to use a cane or walker, do not be ashamed.  These items can keep you from falling and breaking your bones.

## 2016-01-13 NOTE — Progress Notes (Signed)
Subjective:   Sara Haynes is a 71 y.o. female who presents for Medicare Annual (Subsequent) preventive examination.  Review of Systems:  Cardiac Risk Factors include: advanced age (>93men, >57 women);dyslipidemia;family history of premature cardiovascular disease;obesity (BMI >30kg/m2);sedentary lifestyle     Objective:     Vitals: Pulse 88   Temp 97.6 F (36.4 C) (Oral)   Ht 5\' 7"  (1.702 m)   Wt 194 lb 1.9 oz (88.1 kg)   SpO2 95%   BMI 30.40 kg/m   Body mass index is 30.4 kg/m.   Tobacco History  Smoking Status  . Never Smoker  Smokeless Tobacco  . Never Used     Counseling given: Not Answered   Past Medical History:  Diagnosis Date  . Abnormal electrocardiogram 02/03/2010   Sinus bradycardia  . Anemia   . Hyperlipidemia 11/04/2010  . Hypertension 1985   Never successfully treated.  . Medication intolerance   . Tinnitus of both ears    Past Surgical History:  Procedure Laterality Date  . ARM SKIN LESION BIOPSY / EXCISION Right    fatty tumor  . CATARACT EXTRACTION  2011   Bilateral  . COLONOSCOPY    . COLONOSCOPY  09/20/2011   Procedure: COLONOSCOPY;  Surgeon: Rogene Houston, MD;  Location: [facility];  Service: Endoscopy;  Laterality: N/A;  930  . EYE SURGERY Right 11/2010   cataract extraction  . EYE SURGERY Left 01/2011   cataract extraction  . HYSTEROSCOPY W/D&C N/A 05/13/2013   Procedure: DILATATION AND CURETTAGE /HYSTEROSCOPY;  Surgeon: Jonnie Kind, MD;  Location: [facility];  Service: Gynecology;  Laterality: N/A;  . POLYPECTOMY N/A 05/13/2013   Procedure: POLYPECTOMY (removal endometrial polyp);  Surgeon: Jonnie Kind, MD;  Location: [facility];  Service: Gynecology;  Laterality: N/A;  . TUBAL LIGATION  1977   Family History  Problem Relation Age of Onset  . COPD Mother   . Diabetes Mother   . Pneumonia Father   . Alcohol abuse Father   . Diabetes Brother   . Hypertension Brother   . Hypertension Sister   . Hypertension Brother     . Hypertension Sister   . Hypertension Sister   . Hypertension Brother    History  Sexual Activity  . Sexual activity: No    Outpatient Encounter Prescriptions as of 01/13/2016  Medication Sig  . aspirin (ASPIRIN LOW DOSE) 81 MG EC tablet Take 81 mg by mouth daily.    . calcium-vitamin D (OSCAL 500/200 D-3) 500-200 MG-UNIT per tablet Take 1 tablet by mouth daily with breakfast.   . cholecalciferol (VITAMIN D) 1000 UNITS tablet Take 1,000 Units by mouth daily.  . Cyanocobalamin (VITAMIN B 12 PO) Take 1 tablet by mouth daily.   . Multiple Vitamins-Minerals (WOMENS MULTIVITAMIN PLUS) TABS Take 1 tablet by mouth daily.   . [DISCONTINUED] predniSONE (DELTASONE) 5 MG tablet One tablet three times daily for 2 days, then twice daily for 2 days, then daly for 2 days   No facility-administered encounter medications on file as of 01/13/2016.     Activities of Daily Living In your present state of health, do you have any difficulty performing the following activities: 01/13/2016 09/13/2015  Hearing? N N  Vision? N N  Difficulty concentrating or making decisions? N N  Walking or climbing stairs? N N  Dressing or bathing? N N  Doing errands, shopping? N N  Preparing Food and eating ? N -  Using the Toilet? N -  In the past six months, have you accidently leaked urine? N -  Do you have problems with loss of bowel control? N -  Managing your Medications? N -  Managing your Finances? N -  Housekeeping or managing your Housekeeping? N -  Some recent data might be hidden    Patient Care Team: Fayrene Helper, MD as PCP - General Yehuda Savannah, MD (Cardiology)    Assessment:    Exercise Activities and Dietary recommendations Current Exercise Habits: The patient does not participate in regular exercise at present, Exercise limited by: None identified  Goals      Patient Stated   . Exercise 3x per week (30 min per time) (pt-stated)          I would like to start exercising 3 times  a week 45 minutes at a time, starting today 01/13/2016.      Fall Risk Fall Risk  01/13/2016 03/29/2015 12/15/2014 10/02/2013 08/14/2013  Falls in the past year? No No No No No   Depression Screen PHQ 2/9 Scores 01/13/2016 03/29/2015 10/02/2013 08/23/2012  PHQ - 2 Score 0 0 0 0  PHQ- 9 Score - 2 - -     Cognitive Function    Normal 6CIT Screen 01/13/2016  What Year? 0 points  What month? 0 points  What time? 0 points  Count back from 20 0 points  Months in reverse 0 points  Repeat phrase 0 points  Total Score 0    Immunization History  Administered Date(s) Administered  . Influenza Split 11/04/2010, 10/04/2011  . Influenza,inj,Quad PF,36+ Mos 09/27/2012, 10/02/2013, 12/15/2014, 09/13/2015  . Pneumococcal Conjugate-13 08/14/2013  . Pneumococcal Polysaccharide-23 12/02/2009  . Td 12/02/2009   Screening Tests Health Maintenance  Topic Date Due  . ZOSTAVAX  10/19/2016 (Originally 11/12/2004)  . MAMMOGRAM  12/02/2016  . TETANUS/TDAP  12/03/2019  . COLONOSCOPY  09/19/2021  . INFLUENZA VACCINE  Completed  . DEXA SCAN  Completed  . Hepatitis C Screening  Completed  . PNA vac Low Risk Adult  Completed      Plan:  I have personally reviewed and addressed the Medicare Annual Wellness questionnaire and have noted the following in the patient's chart:  A. Medical and social history B. Use of alcohol, tobacco or illicit drugs  C. Current medications and supplements D. Functional ability and status E.  Nutritional status F.  Physical activity G. Advance directives H. List of other physicians I.  Hospitalizations, surgeries, and ER visits in previous 12 months J.  [place] to include hearing, vision, cognitive, depression L. Referrals and appointments, mammogram and DEXA ordered today, patient to call and schedule.  In addition, I have reviewed and discussed with patient certain preventive protocols, quality metrics, and best practice recommendations. A written  personalized care plan for preventive services as well as general preventive health recommendations were provided to patient.  Signed,   Stormy Fabian, LPN Lead Nurse Health Advisor

## 2016-01-21 NOTE — Assessment & Plan Note (Signed)
Uncontrolled and persistently elevated.Pt still reports normal BP at home, and has repeatedly been intolerant of medications. Direct contact made with patient encouraging re evaluation by cardiology, 24 hr BP monitoring may be helpful. States she  Is willing to go for re evaluation, referral entered and specific msg will also be sent to the cardiologist

## 2016-01-21 NOTE — Addendum Note (Signed)
Addended by: Fayrene Helper on: 01/21/2016 09:55 AM   Modules accepted: Orders

## 2016-02-28 ENCOUNTER — Encounter: Payer: Self-pay | Admitting: Cardiovascular Disease

## 2016-02-28 ENCOUNTER — Ambulatory Visit (INDEPENDENT_AMBULATORY_CARE_PROVIDER_SITE_OTHER): Payer: Medicare HMO | Admitting: Cardiovascular Disease

## 2016-02-28 VITALS — BP 200/110 | HR 113 | Ht 66.0 in | Wt 191.0 lb

## 2016-02-28 DIAGNOSIS — Z789 Other specified health status: Secondary | ICD-10-CM

## 2016-02-28 DIAGNOSIS — I10 Essential (primary) hypertension: Secondary | ICD-10-CM

## 2016-02-28 MED ORDER — CARVEDILOL 3.125 MG PO TABS: 3.1250 mg | ORAL_TABLET | Freq: Two times a day (BID) | ORAL | 3 refills | Status: DC

## 2016-02-28 NOTE — Progress Notes (Signed)
CARDIOLOGY CONSULT NOTE  Patient ID: Sara Haynes MRN: [id number] DOB/AGE: 1946/01/01 71 y.o.  Admit date: (Not on file) Primary Physician: Tula Nakayama, MD Referring Physician:   Reason for Consultation: malignant hypertension  HPI: The patient is a 71 year old woman with a history of malignant hypertension. She has reportedly had adverse reactions to amlodipine, hydralazine, hydrochlorothiazide, lisinopril, and spironolactone.  Echocardiogram 06/21/11: Normal left ventricular systolic function, LVEF [id number], mild LVH, mild aortic and mitral regurgitation.  Normal renal artery duplex 03/06/11.  The patient denies any symptoms of chest pain, palpitations, shortness of breath, lightheadedness, dizziness, leg swelling, orthopnea, PND, and syncope.  She says blood pressure was 151/80 at her house this morning.  ECG performed in the office today shows sinus tachycardia, 106 bpm.    Allergies  Allergen Reactions  . Amlodipine     All over cramps  . Hydralazine Other (See Comments)  . Hydrochlorothiazide W-Triamterene     REACTION: cramps  . Lisinopril     Dizzy and leg cramps   . Spironolactone Other (See Comments)    muscle cramps    Current Outpatient Prescriptions  Medication Sig Dispense Refill  . aspirin (ASPIRIN LOW DOSE) 81 MG EC tablet Take 81 mg by mouth daily.      . calcium-vitamin D (OSCAL 500/200 D-3) 500-200 MG-UNIT per tablet Take 1 tablet by mouth daily with breakfast.     . cholecalciferol (VITAMIN D) 1000 UNITS tablet Take 1,000 Units by mouth daily.    . Cyanocobalamin (VITAMIN B 12 PO) Take 1 tablet by mouth daily.     . Multiple Vitamins-Minerals (WOMENS MULTIVITAMIN PLUS) TABS Take 1 tablet by mouth daily.      No current facility-administered medications for this visit.     Past Medical History:  Diagnosis Date  . Abnormal electrocardiogram 02/03/2010   Sinus bradycardia  . Anemia   . Hyperlipidemia 11/04/2010  . Hypertension 1985     Never successfully treated.  . Medication intolerance   . Tinnitus of both ears     Past Surgical History:  Procedure Laterality Date  . ARM SKIN LESION BIOPSY / EXCISION Right    fatty tumor  . CATARACT EXTRACTION  2011   Bilateral  . COLONOSCOPY    . COLONOSCOPY  09/20/2011   Procedure: COLONOSCOPY;  Surgeon: Rogene Houston, MD;  Location: [facility];  Service: Endoscopy;  Laterality: N/A;  930  . EYE SURGERY Right 11/2010   cataract extraction  . EYE SURGERY Left 01/2011   cataract extraction  . HYSTEROSCOPY W/D&C N/A 05/13/2013   Procedure: DILATATION AND CURETTAGE /HYSTEROSCOPY;  Surgeon: Jonnie Kind, MD;  Location: [facility];  Service: Gynecology;  Laterality: N/A;  . POLYPECTOMY N/A 05/13/2013   Procedure: POLYPECTOMY (removal endometrial polyp);  Surgeon: Jonnie Kind, MD;  Location: [facility];  Service: Gynecology;  Laterality: N/A;  . TUBAL LIGATION  1977    Social History   Social History  . Marital status: Married    Spouse name: N/A  . Number of children: 3  . Years of education: N/A   Occupational History  . Retired    Social History Main Topics  . Smoking status: Never Smoker  . Smokeless tobacco: Never Used  . Alcohol use No  . Drug use: No  . Sexual activity: No   Other Topics Concern  . Not on file   Social History Narrative  . No narrative on file     No  family history of premature CAD in 1st degree relatives.  Prior to Admission medications   Medication Sig Start Date End Date Taking? Authorizing Provider  aspirin (ASPIRIN LOW DOSE) 81 MG EC tablet Take 81 mg by mouth daily.     Yes Historical Provider, MD  calcium-vitamin D (OSCAL 500/200 D-3) 500-200 MG-UNIT per tablet Take 1 tablet by mouth daily with breakfast.    Yes Historical Provider, MD  cholecalciferol (VITAMIN D) 1000 UNITS tablet Take 1,000 Units by mouth daily.   Yes Historical Provider, MD  Cyanocobalamin (VITAMIN B 12 PO) Take 1 tablet by mouth daily.    Yes Historical  Provider, MD  Multiple Vitamins-Minerals (WOMENS MULTIVITAMIN PLUS) TABS Take 1 tablet by mouth daily.    Yes Historical Provider, MD     Review of systems complete and found to be negative unless listed above in HPI     Physical exam Blood pressure (!) 200/110, pulse (!) 113, height 5\' 6"  (1.676 m), weight 191 lb (86.6 kg), SpO2 95 %. General: NAD Neck: No JVD, no thyromegaly or thyroid nodule.  Lungs: Clear to auscultation bilaterally with normal respiratory effort. CV: Nondisplaced PMI. Tachycardic, regular hythm, normal S1/S2, no S3, +S4, no murmur.  No peripheral edema.  No carotid bruit.   Abdomen: Soft, nontender, no distention.  Skin: Intact without lesions or rashes.  Neurologic: Alert and oriented x 3.  Psych: Normal affect. Extremities: No clubbing or cyanosis.  HEENT: Normal.   ECG: Most recent ECG reviewed.  Telemetry: Independently reviewed.  Labs:   Lab Results  Component Value Date   WBC 4.9 12/23/2015   HGB 10.8 (L) 12/23/2015   HCT 33.6 (L) 12/23/2015   MCV 90.3 12/23/2015   PLT 199 12/23/2015   No results for input(s): NA, K, CL, CO2, BUN, CREATININE, CALCIUM, PROT, BILITOT, ALKPHOS, ALT, AST, GLUCOSE in the last 168 hours.  Invalid input(s): LABALBU No results found for: CKTOTAL, CKMB, CKMBINDEX, TROPONINI  Lab Results  Component Value Date   CHOL 220 (H) 12/23/2015   CHOL 215 (H) 03/23/2015   CHOL 226 (H) 11/30/2014   Lab Results  Component Value Date   HDL 59 12/23/2015   HDL 61 03/23/2015   HDL 62 11/30/2014   Lab Results  Component Value Date   LDLCALC 137 (H) 12/23/2015   LDLCALC 130 (H) 03/23/2015   LDLCALC 148 (H) 11/30/2014   Lab Results  Component Value Date   TRIG 119 12/23/2015   TRIG 121 03/23/2015   TRIG 79 11/30/2014   Lab Results  Component Value Date   CHOLHDL 3.7 12/23/2015   CHOLHDL 3.5 03/23/2015   CHOLHDL 3.6 11/30/2014   No results found for: LDLDIRECT       Studies: No results found.  ASSESSMENT  AND PLAN:  1. Malignant hypertension with multiple medication intolerances: No evidence for renal artery stenosis in 2013. BP 200/110 today. +S4. Will start Coreg 3.125 mg bid and have her return in 3 weeks for a vitals check with nurse.   Dispo: FU with nurse in 3 weeks for vitals. FU with me in 2 months.   Signed: Kate Sable, M.D., F.A.C.C.  02/28/2016, 2:54 PM

## 2016-02-28 NOTE — Patient Instructions (Signed)
Your physician recommends that you schedule a follow-up appointment in: 3 weeks with nurse, for BP ,VS check   2 months with Dr Bronson Ing    START Coreg 3.125 mg twice a day          Thank you for choosing [facility] !

## 2016-03-20 ENCOUNTER — Telehealth: Payer: Self-pay

## 2016-03-20 NOTE — Telephone Encounter (Signed)
FYI  Pt cancelled her BP check today,states she stopped Coreg too.Says her BP today is 124/70 and that she checks it all the time

## 2016-04-04 ENCOUNTER — Encounter: Payer: Commercial Managed Care - HMO | Admitting: Family Medicine

## 2016-04-28 ENCOUNTER — Ambulatory Visit: Payer: Medicare HMO | Admitting: Cardiovascular Disease

## 2016-05-17 ENCOUNTER — Encounter: Payer: Medicare HMO | Admitting: Family Medicine

## 2016-07-04 ENCOUNTER — Ambulatory Visit (INDEPENDENT_AMBULATORY_CARE_PROVIDER_SITE_OTHER): Payer: Medicare HMO | Admitting: Family Medicine

## 2016-07-04 ENCOUNTER — Encounter: Payer: Self-pay | Admitting: Family Medicine

## 2016-07-04 VITALS — BP 220/110 | HR 100 | Temp 98.3°F | Wt 195.1 lb

## 2016-07-04 DIAGNOSIS — I10 Essential (primary) hypertension: Secondary | ICD-10-CM

## 2016-07-04 DIAGNOSIS — Z0000 Encounter for general adult medical examination without abnormal findings: Secondary | ICD-10-CM | POA: Diagnosis not present

## 2016-07-04 DIAGNOSIS — Z1211 Encounter for screening for malignant neoplasm of colon: Secondary | ICD-10-CM

## 2016-07-04 MED ORDER — CHLORTHALIDONE 25 MG PO TABS: 25.0000 mg | ORAL_TABLET | Freq: Every day | ORAL | 1 refills | Status: DC

## 2016-07-04 NOTE — Assessment & Plan Note (Signed)
,  Annual exam as documented. Counseling done  re healthy lifestyle involving commitment to 150 minutes exercise per week, heart healthy diet, and attaining healthy weight.The importance of adequate sleep also discussed. Regular seat belt use and home safety, is also discussed. Changes in health habits are decided on by the patient with goals and time frames  set for achieving them. Immunization and cancer screening needs are specifically addressed at this visit.  

## 2016-07-04 NOTE — Patient Instructions (Addendum)
F/u in 8 weeks, call if you need me before   Decrease salt intake , increase fruit and vegetable fesh and frozen  BP is too high start medication sent today and take daily at the same time  Non fasting chem 7 and EGFR 1 week before next visit   PLs schedule and get your mammogram , past due [phone]  .Thank you  for choosing [facility]. We consider it a privelige to serve you.  Delivering excellent health care in a caring and  compassionate way is our goal.  Partnering with you,  so that together we can achieve this goal is our strategy.

## 2016-07-06 ENCOUNTER — Telehealth: Payer: Self-pay | Admitting: *Deleted

## 2016-07-06 NOTE — Telephone Encounter (Signed)
I do not see this medication in current med list or in med history, Is patient to be on this medication?

## 2016-07-06 NOTE — Telephone Encounter (Signed)
Patient called stating she needs diltiazem to be filled. Please advise [phone]

## 2016-07-06 NOTE — Telephone Encounter (Signed)
pls explain we have nO record of this mediction on her file, she will need to bring the old bottle, I understand she is intolerant of all medication whichj is why she is specifiaclly asking for this medication which she thinks she can tolerate

## 2016-07-07 ENCOUNTER — Other Ambulatory Visit: Payer: Self-pay | Admitting: Family Medicine

## 2016-07-07 ENCOUNTER — Other Ambulatory Visit: Payer: Self-pay

## 2016-07-07 MED ORDER — DILTIAZEM HCL ER 60 MG PO CP12: 60.0000 mg | ORAL_CAPSULE | Freq: Two times a day (BID) | ORAL | 3 refills | Status: DC

## 2016-07-07 NOTE — Telephone Encounter (Signed)
Patient informed of message below, verbalized understanding.  She states she threw away all old bottles but she was really sure Dr Moshe Cipro was the prescribing provider for the diltazem. She states she was on a 20mg  or 25 mg does, once daily. She states that the other BP meds she has been given make her feel drunk and make her legs hurt. She states she can tolerate the diltazem better than the others. I told her I would send a message to Dr Moshe Cipro with the information but that she was out of the office today and that it may be Monday before she hears back from [place].

## 2016-07-07 NOTE — Telephone Encounter (Signed)
I printed the script for diltiazem, pls fax and let her know

## 2016-07-07 NOTE — Telephone Encounter (Signed)
Patient informed of message below, verbalized understanding. Faxed to [facility] [employer] per pt request

## 2016-07-08 NOTE — Assessment & Plan Note (Signed)
Uncontrolled hypertension, malignant, and on nmo medication, continues to report intolerance States not returning to cardiology Trial of chlorthalidone daily DASH diet and commitment to daily physical activity for a minimum of 30 minutes discussed and encouraged, as a part of hypertension management. The importance of attaining a healthy weight is also discussed.  BP/Weight 07/04/2016 02/28/2016 01/13/2016 09/13/2015 06/14/2015 03/29/2015 43/53/9122  Systolic BP 583 462 194 712 527 129 290  Diastolic BP 903 014 996 924 100 108 100  Wt. (Lbs) 195.12 191 194.12 184.12 190.12 189 193  BMI 31.49 30.83 30.4 28.84 29.77 29.59 30.47      f/u in 5 weeks

## 2016-07-08 NOTE — Progress Notes (Signed)
    Sara Haynes     MRN: [id number]      DOB: 30-Nov-1945  HPI: Patient is in for annual physical exam. Uncontrolled hypertension is addressed/ attempted to be addressed, pt states she is not returning to cardiology, wanted medication starting with 'd" no ,medication on med list like this Continues to report normal pressures at home and intolerance of every medication she is placed on for her blood pressure Recent labs, if available are reviewed. Immunization is reviewed , and  updated if needed.   PE: BP (!) 220/110 (BP Location: Right Arm, Patient Position: Sitting)   Pulse 100   Temp 98.3 F (36.8 C)   Wt 195 lb 1.9 oz (88.5 kg)   SpO2 99%   BMI 31.49 kg/m   Pleasant  female, alert and oriented x 3, in no cardio-pulmonary distress. Afebrile. HEENT No facial trauma or asymetry. Sinuses non tender.  Extra occullar muscles intact,. External ears normal, tympanic membranes clear. Oropharynx moist, no exudate. Neck: supple, no adenopathy,JVD or thyromegaly.No bruits.  Chest: Clear to ascultation bilaterally.No crackles or wheezes. Non tender to palpation  Breast: No asymetry,no masses or lumps. No tenderness. No nipple discharge or inversion. No axillary or supraclavicular adenopathy  Cardiovascular system; Heart sounds normal,  S1 and  S2 ,no S3.  No murmur, or thrill. Apical beat not displaced Peripheral pulses normal.  Abdomen: Soft, non tender, no organomegaly or masses. No bruits. Bowel sounds normal. No guarding, tenderness or rebound.  Rectal:  Normal sphincter tone. No rectal mass. Guaiac negative stool.  GU: Normal external female genitalia, normal female distribution of hair. No ulcers noted. Internal exam; normal sized uterus, no adnexal masses. No uterine tenderness, no adnexal tendernes. Physiologic discharge  Musculoskeletal exam: Full ROM of spine, hips , shoulders and knees. No deformity ,swelling or crepitus noted. No muscle wasting  or atrophy.   Neurologic: Cranial nerves 2 to 12 intact. Power, tone ,sensation and reflexes normal throughout. No disturbance in gait. No tremor.  Skin: Intact, no ulceration, erythema , scaling or rash noted. Pigmentation normal throughout  Psych; Normal mood and affect. Judgement and concentration normal   Assessment & Plan:  Annual physical exam ,Annual exam as documented. Counseling done  re healthy lifestyle involving commitment to 150 minutes exercise per week, heart healthy diet, and attaining healthy weight.The importance of adequate sleep also discussed. Regular seat belt use and home safety, is also discussed. Changes in health habits are decided on by the patient with goals and time frames  set for achieving them. Immunization and cancer screening needs are specifically addressed at this visit.   Malignant hypertension Uncontrolled hypertension, malignant, and on nmo medication, continues to report intolerance States not returning to cardiology Trial of chlorthalidone daily DASH diet and commitment to daily physical activity for a minimum of 30 minutes discussed and encouraged, as a part of hypertension management. The importance of attaining a healthy weight is also discussed.  BP/Weight 07/04/2016 02/28/2016 01/13/2016 09/13/2015 06/14/2015 03/29/2015 18/29/9371  Systolic BP 696 789 381 017 510 258 527  Diastolic BP 782 423 536 144 100 108 100  Wt. (Lbs) 195.12 191 194.12 184.12 190.12 189 193  BMI 31.49 30.83 30.4 28.84 29.77 29.59 30.47      f/u in 5 weeks

## 2016-07-08 NOTE — Assessment & Plan Note (Signed)
Heme negative stool 

## 2016-07-10 LAB — HEMOCCULT GUIAC POC 1CARD (OFFICE): Fecal Occult Blood, POC: NEGATIVE

## 2016-07-22 DIAGNOSIS — Z0000 Encounter for general adult medical examination without abnormal findings: Secondary | ICD-10-CM | POA: Diagnosis not present

## 2016-07-23 LAB — COMPLETE METABOLIC PANEL WITH GFR
ALBUMIN: 4.1 g/dL (ref 3.6–5.1)
ALK PHOS: 72 U/L (ref 33–130)
ALT: 13 U/L (ref 6–29)
AST: 15 U/L (ref 10–35)
BUN: 14 mg/dL (ref 7–25)
CO2: 22 mmol/L (ref 20–31)
CREATININE: 0.96 mg/dL — AB (ref 0.60–0.93)
Calcium: 8.9 mg/dL (ref 8.6–10.4)
Chloride: 107 mmol/L (ref 98–110)
GFR, Est African American: 69 mL/min (ref >=60)
GFR, Est Non African American: 60 mL/min (ref >=60)
GLUCOSE: 109 mg/dL — AB (ref 65–99)
Potassium: 4 mmol/L (ref 3.5–5.3)
Sodium: 141 mmol/L (ref 135–146)
TOTAL PROTEIN: 7.4 g/dL (ref 6.1–8.1)
Total Bilirubin: 0.5 mg/dL (ref 0.2–1.2)

## 2016-07-27 ENCOUNTER — Emergency Department (HOSPITAL_COMMUNITY)
Admission: EM | Admit: 2016-07-27 | Discharge: 2016-07-28 | Disposition: A | Discharge status: Home / Self Care | Payer: Medicare HMO | Attending: Emergency Medicine | Admitting: Emergency Medicine

## 2016-07-27 ENCOUNTER — Emergency Department (HOSPITAL_COMMUNITY): Payer: Medicare HMO

## 2016-07-27 ENCOUNTER — Encounter (HOSPITAL_COMMUNITY): Payer: Self-pay | Admitting: Emergency Medicine

## 2016-07-27 DIAGNOSIS — I10 Essential (primary) hypertension: Secondary | ICD-10-CM | POA: Diagnosis not present

## 2016-07-27 DIAGNOSIS — S8992XA Unspecified injury of left lower leg, initial encounter: Secondary | ICD-10-CM | POA: Diagnosis present

## 2016-07-27 DIAGNOSIS — Y9222 Religious institution as the place of occurrence of the external cause: Secondary | ICD-10-CM | POA: Diagnosis not present

## 2016-07-27 DIAGNOSIS — S82192A Other fracture of upper end of left tibia, initial encounter for closed fracture: Secondary | ICD-10-CM | POA: Diagnosis not present

## 2016-07-27 DIAGNOSIS — Z79899 Other long term (current) drug therapy: Secondary | ICD-10-CM | POA: Diagnosis not present

## 2016-07-27 DIAGNOSIS — Y9341 Activity, dancing: Secondary | ICD-10-CM | POA: Insufficient documentation

## 2016-07-27 DIAGNOSIS — X501XXA Overexertion from prolonged static or awkward postures, initial encounter: Secondary | ICD-10-CM | POA: Diagnosis not present

## 2016-07-27 DIAGNOSIS — M25462 Effusion, left knee: Secondary | ICD-10-CM | POA: Insufficient documentation

## 2016-07-27 DIAGNOSIS — Z7982 Long term (current) use of aspirin: Secondary | ICD-10-CM | POA: Insufficient documentation

## 2016-07-27 DIAGNOSIS — S82102A Unspecified fracture of upper end of left tibia, initial encounter for closed fracture: Secondary | ICD-10-CM | POA: Diagnosis not present

## 2016-07-27 DIAGNOSIS — S82142A Displaced bicondylar fracture of left tibia, initial encounter for closed fracture: Secondary | ICD-10-CM | POA: Insufficient documentation

## 2016-07-27 DIAGNOSIS — Y999 Unspecified external cause status: Secondary | ICD-10-CM | POA: Diagnosis not present

## 2016-07-27 MED ORDER — HYDROCODONE-ACETAMINOPHEN 5-325 MG PO TABS: 1.0000 | ORAL_TABLET | ORAL | 0 refills | Status: DC | PRN

## 2016-07-27 NOTE — Discharge Instructions (Signed)
Wear your knee brace at all times and use the walker you have been prescribed (will need to get picked up in the morning - [place] would carry these) to avoid weight bearing.  You may take the hydrocodone prescribed for pain relief.  This will make you drowsy so use caution with this medicine.

## 2016-07-27 NOTE — ED Triage Notes (Signed)
Pt c/o left knee pain after she states she felt the spirit in church tonight. Pt states she may have twisted her knee her knee while moving.

## 2016-07-27 NOTE — ED Provider Notes (Signed)
[facility] DEPT Provider Note   CSN: [id number] Arrival date & time: 07/27/16  2143     History   Chief Complaint Chief Complaint  Patient presents with  . Knee Pain    HPI Sara Haynes is a 71 y.o. female with past medical history as outlined below presenting with sudden onset of pain and "popping" sensation in the left knee while dancing tonight at church.  She describes twisting the knee and when the pain occurred, she fell backward into the church pew.  She has been able to weight bear since but with increased pain.  There is no radiation of pain. She has developed swelling of the joint since the injury occurred.  Denies numbness distally.  She has had no medications or treatment prior to arrival.  The history is provided by the patient and a relative.    Past Medical History:  Diagnosis Date  . Abnormal electrocardiogram 02/03/2010   Sinus bradycardia  . Anemia   . Hyperlipidemia 11/04/2010  . Hypertension 1985   Never successfully treated.  . Medication intolerance   . Tinnitus of both ears     Patient Active Problem List   Diagnosis Date Noted  . Annual physical exam 03/29/2015  . Overweight (BMI 25.0-29.9) 07/06/2014  . Metabolic syndrome 78/29/5621  . Cystocele with uterine descensus 04/28/2013  . Personal history of noncompliance with medical treatment, presenting hazards to health 11/29/2012  . Colon cancer screening 09/27/2012  . Medication intolerance   . Prediabetes 01/07/2011  . Hyperlipidemia LDL goal <100 11/04/2010  . ABNORMAL ELECTROCARDIOGRAM 02/03/2010  . Malignant hypertension 12/02/2009    Past Surgical History:  Procedure Laterality Date  . ARM SKIN LESION BIOPSY / EXCISION Right    fatty tumor  . CATARACT EXTRACTION  2011   Bilateral  . COLONOSCOPY    . COLONOSCOPY  09/20/2011   Procedure: COLONOSCOPY;  Surgeon: Rogene Houston, MD;  Location: [facility];  Service: Endoscopy;  Laterality: N/A;  930  . EYE SURGERY Right 11/2010    cataract extraction  . EYE SURGERY Left 01/2011   cataract extraction  . HYSTEROSCOPY W/D&C N/A 05/13/2013   Procedure: DILATATION AND CURETTAGE /HYSTEROSCOPY;  Surgeon: Jonnie Kind, MD;  Location: [facility];  Service: Gynecology;  Laterality: N/A;  . POLYPECTOMY N/A 05/13/2013   Procedure: POLYPECTOMY (removal endometrial polyp);  Surgeon: Jonnie Kind, MD;  Location: [facility];  Service: Gynecology;  Laterality: N/A;  . TUBAL LIGATION  1977    OB History    No data available       Home Medications    Prior to Admission medications   Medication Sig Start Date End Date Taking? Authorizing Provider  aspirin (ASPIRIN LOW DOSE) 81 MG EC tablet Take 81 mg by mouth daily.      [provider]  calcium-vitamin D (OSCAL 500/200 D-3) 500-200 MG-UNIT per tablet Take 1 tablet by mouth daily with breakfast.     [provider]  cholecalciferol (VITAMIN D) 1000 UNITS tablet Take 1,000 Units by mouth daily.    [provider]  Cyanocobalamin (VITAMIN B 12 PO) Take 1 tablet by mouth daily.     [provider]  diltiazem (CARDIZEM SR) 60 MG 12 hr capsule Take 1 capsule (60 mg total) by mouth 2 (two) times daily. 07/07/16   Fayrene Helper, MD  HYDROcodone-acetaminophen (NORCO/VICODIN) 5-325 MG tablet Take 1 tablet by mouth every 4 (four) hours as needed. 07/27/16   Evalee Jefferson, PA-C  Multiple Vitamins-Minerals (WOMENS MULTIVITAMIN PLUS) TABS Take 1 tablet by mouth daily.     [provider]    Family History Family History  Problem Relation Age of Onset  . COPD Mother   . Diabetes Mother   . Pneumonia Father   . Alcohol abuse Father   . Diabetes Brother   . Hypertension Brother   . Hypertension Sister   . Hypertension Brother   . Hypertension Sister   . Hypertension Sister   . Hypertension Brother     Social History Social History  Substance Use Topics  . Smoking status: Never Smoker  . Smokeless tobacco: Never Used  . Alcohol use No       Allergies   Chlorthalidone; Amlodipine; Hydralazine; Hydrochlorothiazide w-triamterene; Lisinopril; and Spironolactone   Review of Systems Review of Systems  Constitutional: Negative for fever.  Musculoskeletal: Positive for arthralgias and joint swelling. Negative for myalgias.  Skin: Negative.   Neurological: Negative for weakness and numbness.     Physical Exam Updated Vital Signs BP (!) 211/110 (BP Location: Right Arm)   Pulse 80   Temp 98.3 F (36.8 C) (Oral)   Resp 18   Ht 5\' 7"  (1.702 m)   Wt 85.3 kg (188 lb)   SpO2 100%   BMI 29.44 kg/m   Physical Exam  Constitutional: She appears well-developed and well-nourished.  HENT:  Head: Atraumatic.  Neck: Normal range of motion.  Cardiovascular:  Pulses:      Dorsalis pedis pulses are 2+ on the right side, and 2+ on the left side.  Pulses equal bilaterally  Musculoskeletal: She exhibits edema and tenderness.       Left knee: She exhibits swelling and bony tenderness. She exhibits no ecchymosis, no deformity, no erythema and normal alignment.       Legs: ttp anterior inferior knee joint space.  No deformity. Pt can flex/ext left ankle without increased pain.   Neurological: She is alert. She has normal strength. She displays normal reflexes. No sensory deficit.  Distal sensation intact.    Skin: Skin is warm and dry.  Psychiatric: She has a normal mood and affect.     ED Treatments / Results  Labs (all labs ordered are listed, but only abnormal results are displayed) Labs Reviewed - No data to display  EKG  EKG Interpretation None       Radiology Dg Knee Complete 4 Views Left  Result Date: 07/27/2016 CLINICAL DATA:  Left knee pain following dancing, initial encounter EXAM: LEFT KNEE - COMPLETE 4+ VIEW COMPARISON:  None. FINDINGS: There changes consistent with a vertical fracture through the midportion of tibia extending into the medial tibial plateau well as well as into the medial metaphysis. Only  minimal displacement is noted at the fracture site. Large joint effusion is noted. No other focal abnormality is seen. IMPRESSION: Proximal tibial fracture involving the intercondylar eminence with extension into the medial articular surface as well as the medial metaphysis. Electronically Signed   By: Inez Catalina M.D.   On: 07/27/2016 22:45    Procedures Procedures (including critical care time)  Medications Ordered in ED Medications  diltiazem (CARDIZEM) tablet 60 mg (60 mg Oral Given 07/28/16 0012)  HYDROcodone-acetaminophen (NORCO/VICODIN) 5-325 MG per tablet 1 tablet (1 tablet Oral Given 07/28/16 0012)     Initial Impression / Assessment and Plan / ED Course  I have reviewed the triage vital signs and the nursing notes.  Pertinent labs & imaging results that were available during my  care of the patient were reviewed by me and considered in my medical decision making (see chart for details).     Pt with low impact tibial plateau fracture.  Discussed with Dr. Aline Brochure who will see pt in close f/u. Pt to call in the morning for an appt time.  Nonsurgical injury, he does not request CT imaging of the injury tonight.  Pt was placed in a knee immobilizer, walker prescribed although sister at bedside may be providing her with this tonight.  Discussed face to face home visit with case manager for assistance with safe mobility at home while she recovers from this injury.  She defers at this time, stating she wants to see "how it goes first", does not believe she will require assistance.  Discussed elevated bp.  She has not taken her evening diltiazem - this was provided prior to dc. Also prescribed hydrocodone for pain, first dose given prior to dc.   Review of chart with patients bp's chronically elevated, most recently was 210/110 in pcp's office.  She was started on diltiazem early this month.   Final Clinical Impressions(s) / ED Diagnoses   Final diagnoses:  Tibial plateau fracture, left,  closed, initial encounter  Essential hypertension    New Prescriptions New Prescriptions   HYDROCODONE-ACETAMINOPHEN (NORCO/VICODIN) 5-325 MG TABLET    Take 1 tablet by mouth every 4 (four) hours as needed.     Evalee Jefferson, PA-C 07/28/16 0029    Julianne Rice, MD 08/08/16 1030

## 2016-07-28 ENCOUNTER — Ambulatory Visit (INDEPENDENT_AMBULATORY_CARE_PROVIDER_SITE_OTHER): Payer: Medicare HMO | Admitting: Orthopedic Surgery

## 2016-07-28 ENCOUNTER — Encounter: Payer: Self-pay | Admitting: Orthopedic Surgery

## 2016-07-28 VITALS — BP 122/74 | HR 96

## 2016-07-28 DIAGNOSIS — S82142A Displaced bicondylar fracture of left tibia, initial encounter for closed fracture: Secondary | ICD-10-CM | POA: Diagnosis not present

## 2016-07-28 MED ORDER — HYDROCODONE-ACETAMINOPHEN 5-325 MG PO TABS: 1.0000 | ORAL_TABLET | ORAL | 0 refills | Status: DC | PRN

## 2016-07-28 MED ORDER — DILTIAZEM HCL 30 MG PO TABS
60.0000 mg | ORAL_TABLET | Freq: Once | ORAL | Status: AC
  Administered 2016-07-28: 60 mg via ORAL
  Filled 2016-07-28: qty 2

## 2016-07-28 MED ORDER — HYDROCODONE-ACETAMINOPHEN 5-325 MG PO TABS
1.0000 | ORAL_TABLET | Freq: Once | ORAL | Status: AC
  Administered 2016-07-28: 1 via ORAL
  Filled 2016-07-28: qty 1

## 2016-07-28 NOTE — Progress Notes (Signed)
NEW PATIENT OFFICE VISIT    Chief Complaint  Patient presents with  . Knee Injury    left tibial plateau fracture, DOI 07/27/16    This 71 year old female was at church she was shouting felt something give in her knee fell back hitting the pew and then could not place weight on her left foot. She was evaluated in the emergency room she has an intracondylar fracture which extends into the metaphyseal region is nondisplaced. She complains of mild to moderate dull constant pain in the left knee joint times less than 24    Review of Systems  Constitutional: Negative for chills and fever.  Respiratory: Negative for shortness of breath.   Cardiovascular: Negative for chest pain.  Neurological: Negative for tingling and sensory change.     Past Medical History:  Diagnosis Date  . Abnormal electrocardiogram 02/03/2010   Sinus bradycardia  . Anemia   . Hyperlipidemia 11/04/2010  . Hypertension 1985   Never successfully treated.  . Medication intolerance   . Tinnitus of both ears     Past Surgical History:  Procedure Laterality Date  . ARM SKIN LESION BIOPSY / EXCISION Right    fatty tumor  . CATARACT EXTRACTION  2011   Bilateral  . COLONOSCOPY    . COLONOSCOPY  09/20/2011   Procedure: COLONOSCOPY;  Surgeon: Rogene Houston, MD;  Location: [facility];  Service: Endoscopy;  Laterality: N/A;  930  . EYE SURGERY Right 11/2010   cataract extraction  . EYE SURGERY Left 01/2011   cataract extraction  . HYSTEROSCOPY W/D&C N/A 05/13/2013   Procedure: DILATATION AND CURETTAGE /HYSTEROSCOPY;  Surgeon: Jonnie Kind, MD;  Location: [facility];  Service: Gynecology;  Laterality: N/A;  . POLYPECTOMY N/A 05/13/2013   Procedure: POLYPECTOMY (removal endometrial polyp);  Surgeon: Jonnie Kind, MD;  Location: [facility];  Service: Gynecology;  Laterality: N/A;  . TUBAL LIGATION  1977    Family History  Problem Relation Age of Onset  . COPD Mother   . Diabetes Mother   . Pneumonia Father   .  Alcohol abuse Father   . Diabetes Brother   . Hypertension Brother   . Hypertension Sister   . Hypertension Brother   . Hypertension Sister   . Hypertension Sister   . Hypertension Brother    Social History  Substance Use Topics  . Smoking status: Never Smoker  . Smokeless tobacco: Never Used  . Alcohol use No    BP 122/74   Pulse 96   Physical Exam  Constitutional: She is oriented to person, place, and time. She appears well-developed and well-nourished. No distress.  Cardiovascular: Normal rate and intact distal pulses.   Musculoskeletal:       Left knee: Medial joint line and lateral joint line tenderness noted.  Neurological: She is alert and oriented to person, place, and time. She has normal reflexes. She exhibits normal muscle tone. Coordination normal.  Skin: Skin is warm and dry. No rash noted. She is not diaphoretic. No erythema. No pallor.  Psychiatric: She has a normal mood and affect. Her behavior is normal. Judgment and thought content normal.    Left Knee Exam  Swelling: Mild Effusion: Yes  Tenderness  The patient is experiencing tenderness in the medial joint line, lateral joint line.  Range of Motion  Extension: 0 Flexion:     0  Tests  Varus:  Negative Valgus: Negative  Comments:  Compartments are soft and neurovascular exam reveals normal pulses and  sensation normal dorsiflexion plantar flexion  Right Knee Exam   Tenderness  None  Range of Motion  Normal right knee ROM     Meds ordered this encounter  Medications  . HYDROcodone-acetaminophen (NORCO/VICODIN) 5-325 MG tablet    Sig: Take 1 tablet by mouth every 4 (four) hours as needed.    Dispense:  30 tablet    Refill:  0    4 views of the knee were done at the hospital. She has an intracondylar fracture site extends down into the metaphysis it's nondisplaced  This was a low energy mechanism and is a stable injury  Encounter Diagnosis  Name Primary?  . Closed fracture of left  tibial plateau, initial encounter Yes     PLAN:   Knee immobilizer no weightbearing x-ray 4 weeks, apply hinged brace at that time

## 2016-08-28 ENCOUNTER — Ambulatory Visit: Payer: Medicare HMO | Admitting: Orthopedic Surgery

## 2016-08-30 ENCOUNTER — Ambulatory Visit (INDEPENDENT_AMBULATORY_CARE_PROVIDER_SITE_OTHER): Payer: Medicare HMO

## 2016-08-30 ENCOUNTER — Ambulatory Visit (INDEPENDENT_AMBULATORY_CARE_PROVIDER_SITE_OTHER): Payer: Medicare HMO | Admitting: Orthopedic Surgery

## 2016-08-30 DIAGNOSIS — S82142D Displaced bicondylar fracture of left tibia, subsequent encounter for closed fracture with routine healing: Secondary | ICD-10-CM

## 2016-08-30 NOTE — Progress Notes (Signed)
Fracture care follow-up   Chief Complaint Patient presents with . Knee Injury     left tibial plateau fracture, DOI 07/27/16  Follow-up x-ray shows no depression of the tibial type fracture she is now 4 weeks out  She can go into a hinged knee brace and start active range of motion exercises  Start protected weightbearing with walker and brace  Active range of motion as tolerated  Encounter Diagnosis  Name Primary?  . Closed fracture of left tibial plateau with routine healing, subsequent encounter Yes

## 2016-08-31 ENCOUNTER — Encounter: Payer: Self-pay | Admitting: Family Medicine

## 2016-08-31 ENCOUNTER — Ambulatory Visit (INDEPENDENT_AMBULATORY_CARE_PROVIDER_SITE_OTHER): Payer: Medicare HMO | Admitting: Family Medicine

## 2016-08-31 VITALS — BP 180/110 | HR 103 | Temp 98.8°F | Resp 16 | Ht 67.0 in | Wt 189.8 lb

## 2016-08-31 DIAGNOSIS — Z23 Encounter for immunization: Secondary | ICD-10-CM

## 2016-08-31 DIAGNOSIS — E663 Overweight: Secondary | ICD-10-CM

## 2016-08-31 DIAGNOSIS — Z9119 Patient's noncompliance with other medical treatment and regimen: Secondary | ICD-10-CM | POA: Diagnosis not present

## 2016-08-31 DIAGNOSIS — I10 Essential (primary) hypertension: Secondary | ICD-10-CM | POA: Diagnosis not present

## 2016-08-31 DIAGNOSIS — E785 Hyperlipidemia, unspecified: Secondary | ICD-10-CM

## 2016-08-31 DIAGNOSIS — Z91199 Patient's noncompliance with other medical treatment and regimen due to unspecified reason: Secondary | ICD-10-CM

## 2016-08-31 DIAGNOSIS — Z789 Other specified health status: Secondary | ICD-10-CM | POA: Diagnosis not present

## 2016-08-31 NOTE — Patient Instructions (Addendum)
Wellness visit with  Nurse in early January  mD fo0llow up end January  NEED to take the blood pressure medication as prescribed, this will protect you from heart damage and sreoke, trust that that will be BEST for you and fgollow through  pLEASE  Labs are good  Hope  Leg improves  Please reschedule mammogram  Flu vaccine today   DASH Eating Plan DASH stands for "Dietary Approaches to Stop Hypertension." The DASH eating plan is a healthy eating plan that has been shown to reduce high blood pressure (hypertension). It may also reduce your risk for type 2 diabetes, heart disease, and stroke. The DASH eating plan may also help with weight loss. What are tips for following this plan? General guidelines  Avoid eating more than 2,300 mg (milligrams) of salt (sodium) a day. If you have hypertension, you may need to reduce your sodium intake to 1,500 mg a day.  Limit alcohol intake to no more than 1 drink a day for nonpregnant women and 2 drinks a day for men. One drink equals 12 oz of beer, 5 oz of wine, or 1 oz of hard liquor.  Work with your health care provider to maintain a healthy body weight or to lose weight. Ask what an ideal weight is for you.  Get at least 30 minutes of exercise that causes your heart to beat faster (aerobic exercise) most days of the week. Activities may include walking, swimming, or biking.  Work with your health care provider or diet and nutrition specialist (dietitian) to adjust your eating plan to your individual calorie needs. Reading food labels  Check food labels for the amount of sodium per serving. Choose foods with less than 5 percent of the Daily Value of sodium. Generally, foods with less than 300 mg of sodium per serving fit into this eating plan.  To find whole grains, look for the word "whole" as the first word in the ingredient list. Shopping  Buy products labeled as "low-sodium" or "no salt added."  Buy fresh foods. Avoid canned foods and  premade or frozen meals. Cooking  Avoid adding salt when cooking. Use salt-free seasonings or herbs instead of table salt or sea salt. Check with your health care provider or pharmacist before using salt substitutes.  Do not fry foods. Cook foods using healthy methods such as baking, boiling, grilling, and broiling instead.  Cook with heart-healthy oils, such as olive, canola, soybean, or sunflower oil. Meal planning   Eat a balanced diet that includes: ? 5 or more servings of fruits and vegetables each day. At each meal, try to fill half of your plate with fruits and vegetables. ? Up to 6-8 servings of whole grains each day. ? Less than 6 oz of lean meat, poultry, or fish each day. A 3-oz serving of meat is about the same size as a deck of cards. One egg equals 1 oz. ? 2 servings of low-fat dairy each day. ? A serving of nuts, seeds, or beans 5 times each week. ? Heart-healthy fats. Healthy fats called Omega-3 fatty acids are found in foods such as flaxseeds and coldwater fish, like sardines, salmon, and mackerel.  Limit how much you eat of the following: ? Canned or prepackaged foods. ? Food that is high in trans fat, such as fried foods. ? Food that is high in saturated fat, such as fatty meat. ? Sweets, desserts, sugary drinks, and other foods with added sugar. ? Full-fat dairy products.  Do not salt  foods before eating.  Try to eat at least 2 vegetarian meals each week.  Eat more home-cooked food and less restaurant, buffet, and fast food.  When eating at a restaurant, ask that your food be prepared with less salt or no salt, if possible. What foods are recommended? The items listed may not be a complete list. Talk with your dietitian about what dietary choices are best for you. Grains Whole-grain or whole-wheat bread. Whole-grain or whole-wheat pasta. Brown rice. [place]. Bulgur. Whole-grain and low-sodium cereals. Pita bread. Low-fat, low-sodium crackers.  Whole-wheat flour tortillas. Vegetables Fresh or frozen vegetables (raw, steamed, roasted, or grilled). Low-sodium or reduced-sodium tomato and vegetable juice. Low-sodium or reduced-sodium tomato sauce and tomato paste. Low-sodium or reduced-sodium canned vegetables. Fruits All fresh, dried, or frozen fruit. Canned fruit in natural juice (without added sugar). Meat and other protein foods Skinless chicken or [place]. Ground chicken or [place]. Pork with fat trimmed off. Fish and seafood. Egg whites. Dried beans, peas, or lentils. Unsalted nuts, nut butters, and seeds. Unsalted canned beans. Lean cuts of beef with fat trimmed off. Low-sodium, lean deli meat. Dairy Low-fat (1%) or fat-free (skim) milk. Fat-free, low-fat, or reduced-fat cheeses. Nonfat, low-sodium ricotta or cottage cheese. Low-fat or nonfat yogurt. Low-fat, low-sodium cheese. Fats and oils Soft margarine without trans fats. Vegetable oil. Low-fat, reduced-fat, or light mayonnaise and salad dressings (reduced-sodium). Canola, safflower, olive, soybean, and sunflower oils. Avocado. Seasoning and other foods Herbs. Spices. Seasoning mixes without salt. Unsalted popcorn and pretzels. Fat-free sweets. What foods are not recommended? The items listed may not be a complete list. Talk with your dietitian about what dietary choices are best for you. Grains Baked goods made with fat, such as croissants, muffins, or some breads. Dry pasta or rice meal packs. Vegetables Creamed or fried vegetables. Vegetables in a cheese sauce. Regular canned vegetables (not low-sodium or reduced-sodium). Regular canned tomato sauce and paste (not low-sodium or reduced-sodium). Regular tomato and vegetable juice (not low-sodium or reduced-sodium). Angie Fava. Olives. Fruits Canned fruit in a light or heavy syrup. Fried fruit. Fruit in cream or butter sauce. Meat and other protein foods Fatty cuts of meat. Ribs. Fried meat. Berniece Salines. Sausage. Bologna and other  processed lunch meats. Salami. Fatback. Hotdogs. Bratwurst. Salted nuts and seeds. Canned beans with added salt. Canned or smoked fish. Whole eggs or egg yolks. Chicken or [place] with skin. Dairy Whole or 2% milk, cream, and half-and-half. Whole or full-fat cream cheese. Whole-fat or sweetened yogurt. Full-fat cheese. Nondairy creamers. Whipped toppings. Processed cheese and cheese spreads. Fats and oils Butter. Stick margarine. Lard. Shortening. Ghee. Bacon fat. Tropical oils, such as coconut, palm kernel, or palm oil. Seasoning and other foods Salted popcorn and pretzels. Onion salt, garlic salt, seasoned salt, table salt, and sea salt. Worcestershire sauce. Tartar sauce. Barbecue sauce. Teriyaki sauce. Soy sauce, including reduced-sodium. Steak sauce. Canned and packaged gravies. Fish sauce. Oyster sauce. Cocktail sauce. Horseradish that you find on the shelf. Ketchup. Mustard. Meat flavorings and tenderizers. Bouillon cubes. Hot sauce and Tabasco sauce. Premade or packaged marinades. Premade or packaged taco seasonings. Relishes. Regular salad dressings. Where to find more information:  National Heart, Lung, and [place]: https://wilson-eaton.com/  American Heart Association: www.heart.org Summary  The DASH eating plan is a healthy eating plan that has been shown to reduce high blood pressure (hypertension). It may also reduce your risk for type 2 diabetes, heart disease, and stroke.  With the DASH eating plan, you should limit salt (sodium) intake to 2,300  mg a day. If you have hypertension, you may need to reduce your sodium intake to 1,500 mg a day.  When on the DASH eating plan, aim to eat more fresh fruits and vegetables, whole grains, lean proteins, low-fat dairy, and heart-healthy fats.  Work with your health care provider or diet and nutrition specialist (dietitian) to adjust your eating plan to your individual calorie needs. This information is not intended to replace advice given to  you by your health care provider. Make sure you discuss any questions you have with your health care provider. Document Released: 12/08/2010 Document Revised: 12/13/2015 Document Reviewed: 12/13/2015 Elsevier Interactive Patient Education  2017 [place]. Weight loss goal of 10 pounds

## 2016-09-01 NOTE — Assessment & Plan Note (Signed)
Unchnaged Patient re-educated about  the importance of commitment to a  minimum of 150 minutes of exercise per week.  The importance of healthy food choices with portion control discussed. Encouraged to start a food diary, count calories and to consider  joining a support group. Sample diet sheets offered. Goals set by the patient for the next several months.   Weight /BMI 08/31/2016 07/27/2016 07/04/2016  WEIGHT 189 lb 12 oz 188 lb 195 lb 1.9 oz  HEIGHT 5\' 7"  5\' 7"  -  BMI 29.72 kg/m2 29.44 kg/m2 31.49 kg/m2

## 2016-09-01 NOTE — Progress Notes (Signed)
Sara Haynes     MRN: [id number]      DOB: [birth year]   HPI Sara Haynes is here for follow up and re-evaluation of uncontrolled hypertension. She again states she is unable to take the medication as prescribed , taking half the dose , only once daily and insists that her BPat home is normal In the interim, while "in the spirit" at [facility] she unfortunately fractured her left tibia and this is being managed conservatively  ROS Denies recent fever or chills. Denies sinus pressure, nasal congestion, ear pain or sore throat. Denies chest congestion, productive cough or wheezing. Denies chest pains, palpitations and leg swelling Denies abdominal pain, nausea, vomiting,diarrhea or constipation.   Denies dysuria, frequency, hesitancy or incontinence.  Denies headaches, seizures, numbness, or tingling. Denies depression, anxiety or insomnia. Denies skin break down or rash.   PE  BP (!) 180/110   Pulse (!) 103   Temp 98.8 F (37.1 C) (Other (Comment))   Resp 16   Ht 5\' 7"  (1.702 m)   Wt 189 lb 12 oz (86.1 kg)   SpO2 94%   BMI 29.72 kg/m   Patient alert and oriented and in no cardiopulmonary distress.  HEENT: No facial asymmetry, EOMI,   oropharynx pink and moist.  Neck supple no JVD, no mass.  Chest: Clear to auscultation bilaterally.  CVS: S1, S2 no murmurs, no S3.Regular rate.  ABD: Soft non tender.   Ext: No edema  MS: Adequate ROM spine, shoulders, hips and reduced in  Left knee  Skin: Intact, no ulcerations or rash noted.  Psych: Good eye contact, normal affect. Memory intact not anxious or depressed appearing.  CNS: CN 2-12 intact, power,  normal throughout.no focal deficits noted.   Assessment & Plan  Malignant hypertension Uncontrolled, encouraged STRONGLY to take medication as directed Will f/u in 4 months DASH diet and commitment to daily physical activity for a minimum of 30 minutes discussed and encouraged, as a part of hypertension  management. The importance of attaining a healthy weight is also discussed.  BP/Weight 08/31/2016 07/28/2016 07/28/2016 07/27/2016 07/04/2016 02/28/2016 04/10/8117  Systolic BP 147 829 562 - 130 865 784  Diastolic BP 696 74 295 - 284 110 100  Wt. (Lbs) 189.75 - - 188 195.12 191 194.12  BMI 29.72 - - 29.44 31.49 30.83 30.4       Personal history of noncompliance with medical treatment, presenting hazards to health Unchanged, advised re risk of uncontrolled HTN and need to comply with treatment  Overweight (BMI 25.0-29.9) Unchnaged Patient re-educated about  the importance of commitment to a  minimum of 150 minutes of exercise per week.  The importance of healthy food choices with portion control discussed. Encouraged to start a food diary, count calories and to consider  joining a support group. Sample diet sheets offered. Goals set by the patient for the next several months.   Weight /BMI 08/31/2016 07/27/2016 07/04/2016  WEIGHT 189 lb 12 oz 188 lb 195 lb 1.9 oz  HEIGHT 5\' 7"  5\' 7"  -  BMI 29.72 kg/m2 29.44 kg/m2 31.49 kg/m2      Medication intolerance Reports intolerance of current  medication at prescribed dose , however agrees to increase to the twice daily prescribed dose as It is the one antihypertensive she reports beong able to tolerate at all at this time  Hyperlipidemia LDL goal <100 Hyperlipidemia:Low fat diet discussed and encouraged.   Lipid Panel  Lab Results  Component Value Date   CHOL 220 (  H) 12/23/2015   HDL 59 12/23/2015   LDLCALC 137 (H) 12/23/2015   TRIG 119 12/23/2015   CHOLHDL 3.7 12/23/2015    Updated lab needed at/ before next visit.

## 2016-09-01 NOTE — Assessment & Plan Note (Signed)
Hyperlipidemia:Low fat diet discussed and encouraged.   Lipid Panel  Lab Results  Component Value Date   CHOL 220 (H) 12/23/2015   HDL 59 12/23/2015   LDLCALC 137 (H) 12/23/2015   TRIG 119 12/23/2015   CHOLHDL 3.7 12/23/2015    Updated lab needed at/ before next visit.

## 2016-09-01 NOTE — Assessment & Plan Note (Signed)
Uncontrolled, encouraged STRONGLY to take medication as directed Will f/u in 4 months DASH diet and commitment to daily physical activity for a minimum of 30 minutes discussed and encouraged, as a part of hypertension management. The importance of attaining a healthy weight is also discussed.  BP/Weight 08/31/2016 07/28/2016 07/28/2016 07/27/2016 07/04/2016 02/28/2016 6/57/9038  Systolic BP 333 832 919 - 166 060 045  Diastolic BP 997 74 741 - 423 110 100  Wt. (Lbs) 189.75 - - 188 195.12 191 194.12  BMI 29.72 - - 29.44 31.49 30.83 30.4

## 2016-09-01 NOTE — Assessment & Plan Note (Signed)
Reports intolerance of current  medication at prescribed dose , however agrees to increase to the twice daily prescribed dose as It is the one antihypertensive she reports beong able to tolerate at all at this time

## 2016-09-01 NOTE — Assessment & Plan Note (Signed)
Unchanged, advised re risk of uncontrolled HTN and need to comply with treatment

## 2016-09-07 ENCOUNTER — Telehealth: Payer: Self-pay | Admitting: Orthopedic Surgery

## 2016-09-07 NOTE — Telephone Encounter (Signed)
Sure, I tried to call her carol, but no answer

## 2016-09-07 NOTE — Telephone Encounter (Signed)
Patient came by, brace adjusted (nurse visit)

## 2016-09-07 NOTE — Telephone Encounter (Signed)
Patient called to relay that her left knee/lower leg brace "keeps sliding down."  States she is in the area and would like to stop by for nurse to check it.

## 2016-09-27 ENCOUNTER — Ambulatory Visit (INDEPENDENT_AMBULATORY_CARE_PROVIDER_SITE_OTHER): Payer: Self-pay | Admitting: Orthopedic Surgery

## 2016-09-27 ENCOUNTER — Encounter: Payer: Self-pay | Admitting: Orthopedic Surgery

## 2016-09-27 ENCOUNTER — Ambulatory Visit (INDEPENDENT_AMBULATORY_CARE_PROVIDER_SITE_OTHER): Payer: Medicare HMO

## 2016-09-27 VITALS — BP 181/88 | HR 100 | Ht 67.0 in | Wt 184.0 lb

## 2016-09-27 DIAGNOSIS — S82142D Displaced bicondylar fracture of left tibia, subsequent encounter for closed fracture with routine healing: Secondary | ICD-10-CM

## 2016-09-27 NOTE — Patient Instructions (Addendum)
Use a cane for support for the next 4 weeks  Doing the exercises as instructed on the sheet  Return as needed

## 2016-09-27 NOTE — Progress Notes (Signed)
Fracture care follow-up  Chief Complaint  Patient presents with  . Follow-up    4 week  w/ x rays 2vw Left Knee  DOI: 07-27-16    Post injury day number  62  Fracture treated with  bracing active range of motion  X-rays today show Fracture healing without displacement or depression   Current Outpatient Prescriptions:  .  aspirin (ASPIRIN LOW DOSE) 81 MG EC tablet, Take 81 mg by mouth daily.  , Disp: , Rfl:  .  calcium-vitamin D (OSCAL 500/200 D-3) 500-200 MG-UNIT per tablet, Take 1 tablet by mouth daily with breakfast. , Disp: , Rfl:  .  Cyanocobalamin (VITAMIN B 12 PO), Take 1 tablet by mouth daily. , Disp: , Rfl:  .  diltiazem (CARDIZEM SR) 60 MG 12 hr capsule, Take 1 capsule (60 mg total) by mouth 2 (two) times daily., Disp: 60 capsule, Rfl: 3 .  HYDROcodone-acetaminophen (NORCO/VICODIN) 5-325 MG tablet, Take 1 tablet by mouth every 4 (four) hours as needed., Disp: 30 tablet, Rfl: 0 .  Multiple Vitamins-Minerals (WOMENS MULTIVITAMIN PLUS) TABS, Take 1 tablet by mouth daily. , Disp: , Rfl:    Clinical exam  range of motion active is 5-90 passive is 0-100 collateral ligaments are stable  Encounter Diagnosis  Name Primary?  . Closed fracture of left tibial plateau with routine healing, subsequent encounter Yes    Plan  Use cane for walking for the next 4 weeks  Exercises to strengthen the knee  Release

## 2016-11-02 DIAGNOSIS — Z0100 Encounter for examination of eyes and vision without abnormal findings: Secondary | ICD-10-CM | POA: Diagnosis not present

## 2016-11-02 DIAGNOSIS — I10 Essential (primary) hypertension: Secondary | ICD-10-CM | POA: Diagnosis not present

## 2016-11-02 DIAGNOSIS — H26493 Other secondary cataract, bilateral: Secondary | ICD-10-CM | POA: Diagnosis not present

## 2016-12-05 ENCOUNTER — Encounter: Payer: Self-pay | Admitting: Family Medicine

## 2016-12-15 ENCOUNTER — Telehealth: Payer: Self-pay | Admitting: Family Medicine

## 2016-12-15 NOTE — Telephone Encounter (Signed)
Melissa -with [employer], she sent a fax over on 12-6 and has not heard anything. Can you give her a call -to make sure that you both stay in compliance..[phone]

## 2016-12-19 NOTE — Telephone Encounter (Signed)
Left message and asked to refax

## 2016-12-21 ENCOUNTER — Telehealth: Payer: Self-pay | Admitting: Family Medicine

## 2016-12-21 DIAGNOSIS — Z780 Asymptomatic menopausal state: Secondary | ICD-10-CM

## 2016-12-21 NOTE — Telephone Encounter (Signed)
Lenna Sciara, RN with [employer]'s Osteoporosis Team, left a message on nurse line regarding patient. She is reaching back out to [place] regarding patient's bone density. She is trying to stay in compliance with Medicare and HEDIS guidelines. She states she did receive a fax stating patient had a bone density in 2015, but that scan is too old. She also saw where patient was ordered and did not complete a scan in January 2017. Patient needs to complete her bone density by January 23, 2017. Melissa states she is trying to reach out to patient but she would like information on the facility the test is being ordered at so she can relay all necessary information to the patient.  Callback# [phone]. This is a secure line, so voicemails can be left as needed.

## 2016-12-21 NOTE — Telephone Encounter (Signed)
Information left on confidental voicemail

## 2016-12-25 NOTE — Telephone Encounter (Signed)
closed

## 2017-01-31 ENCOUNTER — Ambulatory Visit: Payer: Medicare HMO | Admitting: Family Medicine

## 2017-02-28 ENCOUNTER — Encounter: Payer: Self-pay | Admitting: Family Medicine

## 2017-02-28 ENCOUNTER — Ambulatory Visit (INDEPENDENT_AMBULATORY_CARE_PROVIDER_SITE_OTHER): Payer: Medicare HMO | Admitting: Family Medicine

## 2017-02-28 VITALS — BP 220/120 | HR 106 | Resp 16 | Ht 67.0 in | Wt 185.0 lb

## 2017-02-28 DIAGNOSIS — Z9119 Patient's noncompliance with other medical treatment and regimen: Secondary | ICD-10-CM | POA: Diagnosis not present

## 2017-02-28 DIAGNOSIS — R7302 Impaired glucose tolerance (oral): Secondary | ICD-10-CM

## 2017-02-28 DIAGNOSIS — Z91199 Patient's noncompliance with other medical treatment and regimen due to unspecified reason: Secondary | ICD-10-CM

## 2017-02-28 DIAGNOSIS — I10 Essential (primary) hypertension: Secondary | ICD-10-CM | POA: Diagnosis not present

## 2017-02-28 DIAGNOSIS — Z1231 Encounter for screening mammogram for malignant neoplasm of breast: Secondary | ICD-10-CM

## 2017-02-28 DIAGNOSIS — E785 Hyperlipidemia, unspecified: Secondary | ICD-10-CM

## 2017-02-28 DIAGNOSIS — Z789 Other specified health status: Secondary | ICD-10-CM

## 2017-02-28 MED ORDER — POTASSIUM CHLORIDE ER 10 MEQ PO TBCR: 10.0000 meq | EXTENDED_RELEASE_TABLET | Freq: Two times a day (BID) | ORAL | 3 refills | Status: DC

## 2017-02-28 MED ORDER — HYDROCHLOROTHIAZIDE 25 MG PO TABS: 25.0000 mg | ORAL_TABLET | Freq: Every day | ORAL | 3 refills | Status: DC

## 2017-02-28 NOTE — Patient Instructions (Addendum)
Wellness with nurse in April  F/u with MD in 2 months  Mammogram to be scheduled at checkout please  Labs today, CBC, lipid, cmp and EGFR, hBA1C , and TSH today  It is important that you exercise regularly at least 30 minutes 5 times a week. If you develop chest pain, have severe difficulty breathing, or feel very tired, stop exercising immediately and seek medical attention    Blood pressure is still high, so you need to start medication HCTZ 25 mg one daily and potassium one twice daily

## 2017-03-03 ENCOUNTER — Encounter: Payer: Self-pay | Admitting: Family Medicine

## 2017-03-03 DIAGNOSIS — R7302 Impaired glucose tolerance (oral): Secondary | ICD-10-CM | POA: Insufficient documentation

## 2017-03-03 NOTE — Assessment & Plan Note (Signed)
Ongoing behavior , still not consistently taking any antihypertensive medications despite markedly elevated blood pressure

## 2017-03-03 NOTE — Assessment & Plan Note (Signed)
Hyperlipidemia:Low fat diet discussed and encouraged.   Lipid Panel  Lab Results  Component Value Date   CHOL 220 (H) 12/23/2015   HDL 59 12/23/2015   LDLCALC 137 (H) 12/23/2015   TRIG 119 12/23/2015   CHOLHDL 3.7 12/23/2015     Updated lab needed

## 2017-03-03 NOTE — Assessment & Plan Note (Signed)
Uncontrolled, pt reports intolerance to all medications and continues to have dangerously elevated blood pressure readings every time that she comes into the office. She has been evaluated by cardiology also for uncontrolled blood pressure  Will start over with HCTZ DASH diet and commitment to daily physical activity for a minimum of 30 minutes discussed and encouraged, as a part of hypertension management. The importance of attaining a healthy weight is also discussed.  BP/Weight 02/28/2017 09/27/2016 08/31/2016 07/28/2016 07/28/2016 05/07/1831 05/09/2516  Systolic BP 984 210 312 811 886 - 773  Diastolic BP 736 88 681 74 110 - 110  Wt. (Lbs) 185 184 189.75 - - 188 195.12  BMI 28.98 28.82 29.72 - - 29.44 31.49

## 2017-03-03 NOTE — Progress Notes (Signed)
Sara Haynes     MRN: [id number]      DOB: 1945-10-02   HPI Sara Haynes is here for follow up and re-evaluation of chronic medical conditions, medication management and review of any available recent lab and radiology data. Mammogram is past due. Needs updated labs. Has not been consistently raking her BP medication, again citing intolerance and stating that often her BP is normal at home  ROS Denies recent fever or chills. Denies sinus pressure, nasal congestion, ear pain or sore throat. Denies chest congestion, productive cough or wheezing. Denies chest pains, palpitations and leg swelling Denies abdominal pain, nausea, vomiting,diarrhea or constipation.   Denies dysuria, frequency, hesitancy or incontinence. Denies joint pain, swelling and limitation in mobility. Denies headaches, seizures, numbness, or tingling. Denies depression, anxiety or insomnia. Denies skin break down or rash.   PE  BP (!) 220/120   Pulse (!) 106   Resp 16   Ht 5\' 7"  (1.702 m)   Wt 185 lb (83.9 kg)   SpO2 96%   BMI 28.98 kg/m   Patient alert and oriented and in no cardiopulmonary distress.  HEENT: No facial asymmetry, EOMI,   oropharynx pink and moist.  Neck supple no JVD, no mass.  Chest: Clear to auscultation bilaterally.  CVS: S1, S2 no murmurs, no S3.Regular rate.  ABD: Soft non tender.   Ext: No edema  MS: Adequate ROM spine, shoulders, hips and knees.  Skin: Intact, no ulcerations or rash noted.  Psych: Good eye contact, normal affect. Memory intact not anxious or depressed appearing.  CNS: CN 2-12 intact, power,  normal throughout.no focal deficits noted.   Assessment & Plan  Malignant hypertension Uncontrolled, pt reports intolerance to all medications and continues to have dangerously elevated blood pressure readings every time that she comes into the office. She has been evaluated by cardiology also for uncontrolled blood pressure  Will start over with HCTZ DASH  diet and commitment to daily physical activity for a minimum of 30 minutes discussed and encouraged, as a part of hypertension management. The importance of attaining a healthy weight is also discussed.  BP/Weight 02/28/2017 09/27/2016 08/31/2016 07/28/2016 07/28/2016 2/50/5397 06/08/3417  Systolic BP 379 024 097 353 299 - 242  Diastolic BP 683 88 419 74 110 - 110  Wt. (Lbs) 185 184 189.75 - - 188 195.12  BMI 28.98 28.82 29.72 - - 29.44 31.49       Hyperlipidemia LDL goal <100 Hyperlipidemia:Low fat diet discussed and encouraged.   Lipid Panel  Lab Results  Component Value Date   CHOL 220 (H) 12/23/2015   HDL 59 12/23/2015   LDLCALC 137 (H) 12/23/2015   TRIG 119 12/23/2015   CHOLHDL 3.7 12/23/2015     Updated lab needed    Medication intolerance Reports intolerance to "capsule " and has not been takng cardizem consistently  Personal history of noncompliance with medical treatment, presenting hazards to health Ongoing behavior , still not consistently taking any antihypertensive medications despite markedly elevated blood pressure  IGT (impaired glucose tolerance) Patient educated about the importance of limiting  Carbohydrate intake , the need to commit to daily physical activity for a minimum of 30 minutes , and to commit weight loss. The fact that changes in all these areas will reduce or eliminate all together the development of diabetes is stressed.   Diabetic Labs Latest Ref Rng & Units 07/22/2016 12/23/2015 03/23/2015 11/30/2014 06/18/2014  HbA1c <5.7 % - 5.4 5.6 5.9(H) 5.7(H)  Chol <200 mg/dL -  220(H) 215(H) 226(H) 198  HDL >50 mg/dL - 59 61 62 62  Calc LDL <100 mg/dL - 137(H) 130(H) 148(H) 109(H)  Triglycerides <150 mg/dL - 119 121 79 133  Creatinine 0.60 - 0.93 mg/dL 0.96(H) 0.90 0.89 1.09(H) 0.98   BP/Weight 02/28/2017 09/27/2016 08/31/2016 07/28/2016 07/28/2016 3/71/0626 09/05/8544  Systolic BP 270 350 093 818 299 - 371  Diastolic BP 696 88 789 74 110 - 110  Wt. (Lbs)  185 184 189.75 - - 188 195.12  BMI 28.98 28.82 29.72 - - 29.44 31.49   Updated lab needed

## 2017-03-03 NOTE — Assessment & Plan Note (Signed)
Patient educated about the importance of limiting  Carbohydrate intake , the need to commit to daily physical activity for a minimum of 30 minutes , and to commit weight loss. The fact that changes in all these areas will reduce or eliminate all together the development of diabetes is stressed.   Diabetic Labs Latest Ref Rng & Units 07/22/2016 12/23/2015 03/23/2015 11/30/2014 06/18/2014  HbA1c <5.7 % - 5.4 5.6 5.9(H) 5.7(H)  Chol <200 mg/dL - 220(H) 215(H) 226(H) 198  HDL >50 mg/dL - 59 61 62 62  Calc LDL <100 mg/dL - 137(H) 130(H) 148(H) 109(H)  Triglycerides <150 mg/dL - 119 121 79 133  Creatinine 0.60 - 0.93 mg/dL 0.96(H) 0.90 0.89 1.09(H) 0.98   BP/Weight 02/28/2017 09/27/2016 08/31/2016 07/28/2016 07/28/2016 1/74/0814 04/10/1854  Systolic BP 314 970 263 785 885 - 027  Diastolic BP 741 88 287 74 110 - 110  Wt. (Lbs) 185 184 189.75 - - 188 195.12  BMI 28.98 28.82 29.72 - - 29.44 31.49   Updated lab needed

## 2017-03-03 NOTE — Assessment & Plan Note (Signed)
Reports intolerance to "capsule " and has not been takng cardizem consistently

## 2017-03-07 ENCOUNTER — Telehealth: Payer: Self-pay

## 2017-03-07 ENCOUNTER — Ambulatory Visit (HOSPITAL_COMMUNITY)
Admission: RE | Admit: 2017-03-07 | Discharge: 2017-03-07 | Disposition: A | Discharge status: Home / Self Care | Payer: Medicare HMO | Source: Ambulatory Visit | Attending: Family Medicine | Admitting: Family Medicine

## 2017-03-07 DIAGNOSIS — I10 Essential (primary) hypertension: Secondary | ICD-10-CM | POA: Diagnosis not present

## 2017-03-07 DIAGNOSIS — E785 Hyperlipidemia, unspecified: Secondary | ICD-10-CM | POA: Diagnosis not present

## 2017-03-07 DIAGNOSIS — Z1231 Encounter for screening mammogram for malignant neoplasm of breast: Secondary | ICD-10-CM

## 2017-03-07 DIAGNOSIS — E8881 Metabolic syndrome: Secondary | ICD-10-CM

## 2017-03-07 NOTE — Telephone Encounter (Signed)
Labs ordered.

## 2017-03-08 LAB — COMPLETE METABOLIC PANEL WITH GFR
AG Ratio: 1.3 (calc) (ref 1.0–2.5)
ALKALINE PHOSPHATASE (APISO): 64 U/L (ref 33–130)
ALT: 11 U/L (ref 6–29)
AST: 16 U/L (ref 10–35)
Albumin: 4.5 g/dL (ref 3.6–5.1)
BILIRUBIN TOTAL: 0.7 mg/dL (ref 0.2–1.2)
BUN/Creatinine Ratio: 28 (calc) — ABNORMAL HIGH (ref 6–22)
BUN: 29 mg/dL — AB (ref 7–25)
CO2: 31 mmol/L (ref 20–32)
Calcium: 10.5 mg/dL — ABNORMAL HIGH (ref 8.6–10.4)
Chloride: 101 mmol/L (ref 98–110)
Creat: 1.05 mg/dL — ABNORMAL HIGH (ref 0.60–0.93)
GFR, Est African American: 61 mL/min/{1.73_m2} (ref >=60)
GFR, Est Non African American: 53 mL/min/{1.73_m2} — ABNORMAL LOW (ref >=60)
GLUCOSE: 115 mg/dL (ref 65–139)
Globulin: 3.4 g/dL (calc) (ref 1.9–3.7)
POTASSIUM: 3.9 mmol/L (ref 3.5–5.3)
Sodium: 139 mmol/L (ref 135–146)
TOTAL PROTEIN: 7.9 g/dL (ref 6.1–8.1)

## 2017-03-08 LAB — CBC
HEMATOCRIT: 33.8 % — AB (ref 35.0–45.0)
HEMOGLOBIN: 11 g/dL — AB (ref 11.7–15.5)
MCH: 29.2 pg (ref 27.0–33.0)
MCHC: 32.5 g/dL (ref 32.0–36.0)
MCV: 89.7 fL (ref 80.0–100.0)
MPV: 11.8 fL (ref 7.5–12.5)
Platelets: 178 10*3/uL (ref 140–400)
RBC: 3.77 10*6/uL — ABNORMAL LOW (ref 3.80–5.10)
RDW: 13 % (ref 11.0–15.0)
WBC: 4 10*3/uL (ref 3.8–10.8)

## 2017-03-08 LAB — HEMOGLOBIN A1C
HEMOGLOBIN A1C: 5.7 %{Hb} — AB (ref <5.7)
MEAN PLASMA GLUCOSE: 117 (calc)
eAG (mmol/L): 6.5 (calc)

## 2017-03-08 LAB — LIPID PANEL
CHOL/HDL RATIO: 3.8 (calc) (ref <5.0)
Cholesterol: 214 mg/dL — ABNORMAL HIGH (ref <200)
HDL: 56 mg/dL (ref >50)
LDL Cholesterol (Calc): 135 mg/dL (calc) — ABNORMAL HIGH
NON-HDL CHOLESTEROL (CALC): 158 mg/dL — AB (ref <130)
TRIGLYCERIDES: 123 mg/dL (ref <150)

## 2017-03-08 LAB — TSH: TSH: 3.16 m[IU]/L (ref 0.40–4.50)

## 2017-03-19 ENCOUNTER — Telehealth: Payer: Self-pay | Admitting: Family Medicine

## 2017-03-19 NOTE — Telephone Encounter (Signed)
Ann with [facility] is calling regarding a Dexoscan for the patient, if it was completed on 12-21-16 please send her a copy.

## 2017-03-21 NOTE — Telephone Encounter (Signed)
[phone] °

## 2017-03-21 NOTE — Telephone Encounter (Signed)
I have no contact number or fax number in the message. The number attached to the message belongs to the patient. Please let me know if they call back about this

## 2017-03-27 NOTE — Telephone Encounter (Signed)
Was not done in 2018. Last one completed was in 2015

## 2017-04-16 ENCOUNTER — Ambulatory Visit (INDEPENDENT_AMBULATORY_CARE_PROVIDER_SITE_OTHER): Payer: Medicare HMO

## 2017-04-16 VITALS — BP 180/98 | HR 98 | Temp 98.2°F | Resp 16 | Ht 66.0 in | Wt 192.0 lb

## 2017-04-16 DIAGNOSIS — Z0000 Encounter for general adult medical examination without abnormal findings: Secondary | ICD-10-CM

## 2017-04-16 NOTE — Progress Notes (Signed)
Subjective:   Sara Haynes is a 72 y.o. female who presents for Medicare Annual (Subsequent) preventive examination.  Review of Systems:         Objective:     Vitals: There were no vitals taken for this visit.  There is no height or weight on file to calculate BMI.  Advanced Directives 07/27/2016 01/13/2016 05/13/2013 05/07/2013 09/20/2011  Does Patient Have a Medical Advance Directive? No No Patient does not have advance directive;Patient would not like information Patient does not have advance directive;Patient would not like information Patient does not have advance directive;Patient would not like information  Would patient like information on creating a medical advance directive? - Yes (MAU/Ambulatory/Procedural Areas - Information given) - - -  Pre-existing out of facility DNR order (yellow form or pink MOST form) - - - No -    Tobacco Social History   Tobacco Use  Smoking Status Never Smoker  Smokeless Tobacco Never Used     Counseling given: Not Answered   Clinical Intake:                       Past Medical History:  Diagnosis Date  . Abnormal electrocardiogram 02/03/2010   Sinus bradycardia  . Anemia   . Hyperlipidemia 11/04/2010  . Hypertension 1985   Never successfully treated.  . Medication intolerance   . Tinnitus of both ears    Past Surgical History:  Procedure Laterality Date  . ARM SKIN LESION BIOPSY / EXCISION Right    fatty tumor  . CATARACT EXTRACTION  2011   Bilateral  . COLONOSCOPY    . COLONOSCOPY  09/20/2011   Procedure: COLONOSCOPY;  Surgeon: Rogene Houston, MD;  Location: [facility];  Service: Endoscopy;  Laterality: N/A;  930  . EYE SURGERY Right 11/2010   cataract extraction  . EYE SURGERY Left 01/2011   cataract extraction  . HYSTEROSCOPY W/D&C N/A 05/13/2013   Procedure: DILATATION AND CURETTAGE /HYSTEROSCOPY;  Surgeon: Jonnie Kind, MD;  Location: [facility];  Service: Gynecology;  Laterality: N/A;  . POLYPECTOMY  N/A 05/13/2013   Procedure: POLYPECTOMY (removal endometrial polyp);  Surgeon: Jonnie Kind, MD;  Location: [facility];  Service: Gynecology;  Laterality: N/A;  . TUBAL LIGATION  1977   Family History  Problem Relation Age of Onset  . COPD Mother   . Diabetes Mother   . Pneumonia Father   . Alcohol abuse Father   . Diabetes Brother   . Hypertension Brother   . Hypertension Sister   . Hypertension Brother   . Hypertension Sister   . Hypertension Sister   . Hypertension Brother    Social History   Socioeconomic History  . Marital status: Married    Spouse name: Not on file  . Number of children: 3  . Years of education: Not on file  . Highest education level: Not on file  Occupational History  . Occupation: Retired  Scientific laboratory technician  . Financial resource strain: Not on file  . Food insecurity:    Worry: Not on file    Inability: Not on file  . Transportation needs:    Medical: Not on file    Non-medical: Not on file  Tobacco Use  . Smoking status: Never Smoker  . Smokeless tobacco: Never Used  Substance and Sexual Activity  . Alcohol use: No  . Drug use: No  . Sexual activity: Never    Birth control/protection: Post-menopausal  Lifestyle  .  Physical activity:    Days per week: Not on file    Minutes per session: Not on file  . Stress: Not on file  Relationships  . Social connections:    Talks on phone: Not on file    Gets together: Not on file    Attends religious service: Not on file    Active member of club or organization: Not on file    Attends meetings of clubs or organizations: Not on file    Relationship status: Not on file  Other Topics Concern  . Not on file  Social History Narrative  . Not on file    Outpatient Encounter Medications as of 04/16/2017  Medication Sig  . aspirin (ASPIRIN LOW DOSE) 81 MG EC tablet Take 81 mg by mouth daily.    . calcium-vitamin D (OSCAL 500/200 D-3) 500-200 MG-UNIT per tablet Take 1 tablet by mouth daily with breakfast.    . Cyanocobalamin (VITAMIN B 12 PO) Take 1 tablet by mouth daily.   . hydrochlorothiazide (HYDRODIURIL) 25 MG tablet Take 1 tablet (25 mg total) by mouth daily.  . Multiple Vitamins-Minerals (WOMENS MULTIVITAMIN PLUS) TABS Take 1 tablet by mouth daily.   . potassium chloride (K-DUR) 10 MEQ tablet Take 1 tablet (10 mEq total) by mouth 2 (two) times daily.   No facility-administered encounter medications on file as of 04/16/2017.     Activities of Daily Living No flowsheet data found.  Patient Care Team: Fayrene Helper, MD as PCP - General    Assessment:   This is a routine wellness examination for [name].  Exercise Activities and Dietary recommendations    Goals    . Exercise 3x per week (30 min per time) (pt-stated)     I would like to start exercising 3 times a week 45 minutes at a time, starting today 01/13/2016.       Fall Risk Fall Risk  07/04/2016 01/13/2016 03/29/2015 12/15/2014 10/02/2013  Falls in the past year? No No No No No   Is the patient's home free of loose throw rugs in walkways, pet beds, electrical cords, etc?   yes      Grab bars in the bathroom? yes      Handrails on the stairs?   yes      Adequate lighting?   yes  Depression Screen PHQ 2/9 Scores 07/04/2016 01/13/2016 03/29/2015 10/02/2013  PHQ - 2 Score 0 0 0 0  PHQ- 9 Score - - 2 -     Cognitive Function     6CIT Screen 01/13/2016  What Year? 0 points  What month? 0 points  What time? 0 points  Count back from 20 0 points  Months in reverse 0 points  Repeat phrase 0 points  Total Score 0    Immunization History  Administered Date(s) Administered  . Influenza Split 11/04/2010, 10/04/2011  . Influenza,inj,Quad PF,6+ Mos 09/27/2012, 10/02/2013, 12/15/2014, 09/13/2015, 08/31/2016  . Pneumococcal Conjugate-13 08/14/2013  . Pneumococcal Polysaccharide-23 12/02/2009  . Td 12/02/2009    Qualifies for Shingles Vaccine? Yes.   Screening Tests Health Maintenance  Topic Date Due  . INFLUENZA  VACCINE  08/02/2017  . MAMMOGRAM  03/08/2019  . TETANUS/TDAP  12/03/2019  . COLONOSCOPY  09/19/2021  . DEXA SCAN  Completed  . Hepatitis C Screening  Completed  . PNA vac Low Risk Adult  Completed    Cancer Screenings: Lung: Low Dose CT Chest recommended if Age 19-80 years, 30 pack-year currently smoking OR have quit w/in  15years. Patient does not qualify. Breast:  Up to date on Mammogram? Yes   Up to date of Bone Density/Dexa? Yes Colorectal: She will discuss the need for this with Dr. Moshe Cipro    Plan:      I have personally reviewed and noted the following in the patient's chart:   . Medical and social history . Use of alcohol, tobacco or illicit drugs  . Current medications and supplements . Functional ability and status . Nutritional status . Physical activity . Advanced directives . List of other physicians . Hospitalizations, surgeries, and ER visits in previous 12 months . Vitals . Screenings to include cognitive, depression, and falls . Referrals and appointments  In addition, I have reviewed and discussed with patient certain preventive protocols, quality metrics, and best practice recommendations. A written personalized care plan for preventive services as well as general preventive health recommendations were provided to patient.     Merceda Elks, LPN  06/28/9483

## 2017-04-16 NOTE — Patient Instructions (Signed)
Sara Haynes , Thank you for taking time to come for your Medicare Wellness Visit. I appreciate your ongoing commitment to your health goals. Please review the following plan we discussed and let me know if I can assist you in the future.   Screening recommendations/referrals: Colonoscopy: Discuss your need for this with Dr. Moshe Cipro Mammogram: Due 03/2018 Bone Density: Up to date Recommended yearly ophthalmology/optometry visit for glaucoma screening and checkup Recommended yearly dental visit for hygiene and checkup  Vaccinations: Influenza vaccine: Due fall 2019 Pneumococcal vaccine: Due 2020 Tdap vaccine: Due 2021 Shingles vaccine: You are due for this. Please consider this vaccine and also, please call your insurance for coverage information.    Advanced directives: Discussed today in office  Conditions/risks identified: None  Next appointment: 73/7/19   Preventive Care 73 Years and Older, Female Preventive care refers to lifestyle choices and visits with your health care provider that can promote health and wellness. What does preventive care include?  A yearly physical exam. This is also called an annual well check.  Dental exams once or twice a year.  Routine eye exams. Ask your health care provider how often you should have your eyes checked.  Personal lifestyle choices, including:  Daily care of your teeth and gums.  Regular physical activity.  Eating a healthy diet.  Avoiding tobacco and drug use.  Limiting alcohol use.  Practicing safe sex.  Taking low-dose aspirin every day.  Taking vitamin and mineral supplements as recommended by your health care provider. What happens during an annual well check? The services and screenings done by your health care provider during your annual well check will depend on your age, overall health, lifestyle risk factors, and family history of disease. Counseling  Your health care provider may ask you questions about  your:  Alcohol use.  Tobacco use.  Drug use.  Emotional well-being.  Home and relationship well-being.  Sexual activity.  Eating habits.  History of falls.  Memory and ability to understand (cognition).  Work and work Statistician.  Reproductive health. Screening  You may have the following tests or measurements:  Height, weight, and BMI.  Blood pressure.  Lipid and cholesterol levels. These may be checked every 5 years, or more frequently if you are over 81 years old.  Skin check.  Lung cancer screening. You may have this screening every year starting at age 31 if you have a 30-pack-year history of smoking and currently smoke or have quit within the past 15 years.  Fecal occult blood test (FOBT) of the stool. You may have this test every year starting at age 72.  Flexible sigmoidoscopy or colonoscopy. You may have a sigmoidoscopy every 5 years or a colonoscopy every 10 years starting at age 30.  Hepatitis C blood test.  Hepatitis B blood test.  Sexually transmitted disease (STD) testing.  Diabetes screening. This is done by checking your blood sugar (glucose) after you have not eaten for a while (fasting). You may have this done every 1-3 years.  Bone density scan. This is done to screen for osteoporosis. You may have this done starting at age 30.  Mammogram. This may be done every 1-2 years. Talk to your health care provider about how often you should have regular mammograms. Talk with your health care provider about your test results, treatment options, and if necessary, the need for more tests. Vaccines  Your health care provider may recommend certain vaccines, such as:  Influenza vaccine. This is recommended every year.  Tetanus, diphtheria, and acellular pertussis (Tdap, Td) vaccine. You may need a Td booster every 10 years.  Zoster vaccine. You may need this after age 82.  Pneumococcal 13-valent conjugate (PCV13) vaccine. One dose is recommended  after age 74.  Pneumococcal polysaccharide (PPSV23) vaccine. One dose is recommended after age 59. Talk to your health care provider about which screenings and vaccines you need and how often you need them. This information is not intended to replace advice given to you by your health care provider. Make sure you discuss any questions you have with your health care provider. Document Released: 01/15/2015 Document Revised: 09/08/2015 Document Reviewed: 10/20/2014 Elsevier Interactive Patient Education  2017 [place] Prevention in the Home Falls can cause injuries. They can happen to people of all ages. There are many things you can do to make your home safe and to help prevent falls. What can I do on the outside of my home?  Regularly fix the edges of walkways and driveways and fix any cracks.  Remove anything that might make you trip as you walk through a door, such as a raised step or threshold.  Trim any bushes or trees on the path to your home.  Use bright outdoor lighting.  Clear any walking paths of anything that might make someone trip, such as rocks or tools.  Regularly check to see if handrails are loose or broken. Make sure that both sides of any steps have handrails.  Any raised decks and porches should have guardrails on the edges.  Have any leaves, snow, or ice cleared regularly.  Use sand or salt on walking paths during winter.  Clean up any spills in your garage right away. This includes oil or grease spills. What can I do in the bathroom?  Use night lights.  Install grab bars by the toilet and in the tub and shower. Do not use towel bars as grab bars.  Use non-skid mats or decals in the tub or shower.  If you need to sit down in the shower, use a plastic, non-slip stool.  Keep the floor dry. Clean up any water that spills on the floor as soon as it happens.  Remove soap buildup in the tub or shower regularly.  Attach bath mats securely with  double-sided non-slip rug tape.  Do not have throw rugs and other things on the floor that can make you trip. What can I do in the bedroom?  Use night lights.  Make sure that you have a light by your bed that is easy to reach.  Do not use any sheets or blankets that are too big for your bed. They should not hang down onto the floor.  Have a firm chair that has side arms. You can use this for support while you get dressed.  Do not have throw rugs and other things on the floor that can make you trip. What can I do in the kitchen?  Clean up any spills right away.  Avoid walking on wet floors.  Keep items that you use a lot in easy-to-reach places.  If you need to reach something above you, use a strong step stool that has a grab bar.  Keep electrical cords out of the way.  Do not use floor polish or wax that makes floors slippery. If you must use wax, use non-skid floor wax.  Do not have throw rugs and other things on the floor that can make you trip. What can I do  with my stairs?  Do not leave any items on the stairs.  Make sure that there are handrails on both sides of the stairs and use them. Fix handrails that are broken or loose. Make sure that handrails are as long as the stairways.  Check any carpeting to make sure that it is firmly attached to the stairs. Fix any carpet that is loose or worn.  Avoid having throw rugs at the top or bottom of the stairs. If you do have throw rugs, attach them to the floor with carpet tape.  Make sure that you have a light switch at the top of the stairs and the bottom of the stairs. If you do not have them, ask someone to add them for you. What else can I do to help prevent falls?  Wear shoes that:  Do not have high heels.  Have rubber bottoms.  Are comfortable and fit you well.  Are closed at the toe. Do not wear sandals.  If you use a stepladder:  Make sure that it is fully opened. Do not climb a closed stepladder.  Make  sure that both sides of the stepladder are locked into place.  Ask someone to hold it for you, if possible.  Clearly mark and make sure that you can see:  Any grab bars or handrails.  First and last steps.  Where the edge of each step is.  Use tools that help you move around (mobility aids) if they are needed. These include:  Canes.  Walkers.  Scooters.  Crutches.  Turn on the lights when you go into a dark area. Replace any light bulbs as soon as they burn out.  Set up your furniture so you have a clear path. Avoid moving your furniture around.  If any of your floors are uneven, fix them.  If there are any pets around you, be aware of where they are.  Review your medicines with your doctor. Some medicines can make you feel dizzy. This can increase your chance of falling. Ask your doctor what other things that you can do to help prevent falls. This information is not intended to replace advice given to you by your health care provider. Make sure you discuss any questions you have with your health care provider. Document Released: 10/15/2008 Document Revised: 05/27/2015 Document Reviewed: 01/23/2014 Elsevier Interactive Patient Education  2017 [place] for Adults  A healthy lifestyle and preventive care can promote health and wellness. Preventive health guidelines for adults include the following key practices.  . A routine yearly physical is a good way to check with your health care provider about your health and preventive screening. It is a chance to share any concerns and updates on your health and to receive a thorough exam.  . Visit your dentist for a routine exam and preventive care every 6 months. Brush your teeth twice a day and floss once a day. Good oral hygiene prevents tooth decay and gum disease.  . The frequency of eye exams is based on your age, health, family medical history, use  of contact lenses, and other factors. Follow your  health care provider's ecommendations for frequency of eye exams.  . Eat a healthy diet. Foods like vegetables, fruits, whole grains, low-fat dairy products, and lean protein foods contain the nutrients you need without too many calories. Decrease your intake of foods high in solid fats, added sugars, and salt. Eat the right amount of calories for you. Get information about  a proper diet from your health care provider, if necessary.  . Regular physical exercise is one of the most important things you can do for your health. Most adults should get at least 150 minutes of moderate-intensity exercise (any activity that increases your heart rate and causes you to sweat) each week. In addition, most adults need muscle-strengthening exercises on 2 or more days a week.  Silver Sneakers may be a benefit available to you. To determine eligibility, you may visit the website: www.silversneakers.com or contact program at 717-461-4803 Mon-Fri between 8AM-8PM.   . Maintain a healthy weight. The body mass index (BMI) is a screening tool to identify possible weight problems. It provides an estimate of body fat based on height and weight. Your health care provider can find your BMI and can help you achieve or maintain a healthy weight.   For adults 20 years and older: ? A BMI below 18.5 is considered underweight. ? A BMI of 18.5 to 24.9 is normal. ? A BMI of 25 to 29.9 is considered overweight. ? A BMI of 30 and above is considered obese.   . Maintain normal blood lipids and cholesterol levels by exercising and minimizing your intake of saturated fat. Eat a balanced diet with plenty of fruit and vegetables. Blood tests for lipids and cholesterol should begin at age 98 and be repeated every 5 years. If your lipid or cholesterol levels are high, you are over 50, or you are at high risk for heart disease, you may need your cholesterol levels checked more frequently. Ongoing high lipid and cholesterol levels should be  treated with medicines if diet and exercise are not working.  . If you smoke, find out from your health care provider how to quit. If you do not use tobacco, please do not start.  . If you choose to drink alcohol, please do not consume more than 2 drinks per day. One drink is considered to be 12 ounces (355 mL) of beer, 5 ounces (148 mL) of wine, or 1.5 ounces (44 mL) of liquor.  . If you are 59-32 years old, ask your health care provider if you should take aspirin to prevent strokes.  . Use sunscreen. Apply sunscreen liberally and repeatedly throughout the day. You should seek shade when your shadow is shorter than you. Protect yourself by wearing long sleeves, pants, a wide-brimmed hat, and sunglasses year round, whenever you are outdoors.  . Once a month, do a whole body skin exam, using a mirror to look at the skin on your back. Tell your health care provider of new moles, moles that have irregular borders, moles that are larger than a pencil eraser, or moles that have changed in shape or color.

## 2017-04-18 ENCOUNTER — Encounter: Payer: Self-pay | Admitting: Family Medicine

## 2017-04-18 ENCOUNTER — Ambulatory Visit (INDEPENDENT_AMBULATORY_CARE_PROVIDER_SITE_OTHER): Payer: Medicare HMO | Admitting: Family Medicine

## 2017-04-18 ENCOUNTER — Ambulatory Visit (HOSPITAL_COMMUNITY)
Admission: RE | Admit: 2017-04-18 | Discharge: 2017-04-18 | Disposition: A | Discharge status: Home / Self Care | Payer: Medicare HMO | Source: Ambulatory Visit | Attending: Family Medicine | Admitting: Family Medicine

## 2017-04-18 VITALS — BP 220/120 | HR 100 | Resp 16 | Ht 67.0 in | Wt 192.0 lb

## 2017-04-18 DIAGNOSIS — M25561 Pain in right knee: Secondary | ICD-10-CM | POA: Insufficient documentation

## 2017-04-18 DIAGNOSIS — I10 Essential (primary) hypertension: Secondary | ICD-10-CM | POA: Diagnosis not present

## 2017-04-18 DIAGNOSIS — E8881 Metabolic syndrome: Secondary | ICD-10-CM

## 2017-04-18 DIAGNOSIS — M1711 Unilateral primary osteoarthritis, right knee: Secondary | ICD-10-CM | POA: Diagnosis not present

## 2017-04-18 DIAGNOSIS — G8929 Other chronic pain: Secondary | ICD-10-CM

## 2017-04-18 DIAGNOSIS — E663 Overweight: Secondary | ICD-10-CM

## 2017-04-18 DIAGNOSIS — R7302 Impaired glucose tolerance (oral): Secondary | ICD-10-CM | POA: Diagnosis not present

## 2017-04-18 DIAGNOSIS — E785 Hyperlipidemia, unspecified: Secondary | ICD-10-CM | POA: Diagnosis not present

## 2017-04-18 MED ORDER — AMLODIPINE BESYLATE 5 MG PO TABS: 5.0000 mg | ORAL_TABLET | Freq: Every day | ORAL | 3 refills | Status: DC

## 2017-04-18 NOTE — Patient Instructions (Signed)
Physical exam end July  ,call if you need me before   MD f/u for blood pressure  2nd week in June  New for blood pressure is amlodipine 5 mg one  Daily , take at same time every day  Please get X ray of your knee at the hospital today and I have referred you to r Aline Brochure, his office will call  Please reduce sweets and fatty foods, cholesterol and blood sugar are high

## 2017-04-18 NOTE — Assessment & Plan Note (Signed)
Four week h/o right knee pain and swelling, no trauma, needs x ray and refer to ortho, apparent lipoma on lateral aspect

## 2017-04-22 ENCOUNTER — Encounter: Payer: Self-pay | Admitting: Family Medicine

## 2017-04-22 NOTE — Progress Notes (Signed)
Sara Haynes     MRN: [id number]      DOB: [birth year]   HPI Sara Haynes is here with 1 week h/o right knee pain pain and swelling , no h/o trauma , states there has been some improvement in the past 2 days, she has been icing and using tylenol. Currently not taking any antihypertensives, feels as though her BP is  good, and settees she was intolerant of the last medication she was put on  ROS Denies recent fever or chills. Denies sinus pressure, nasal congestion, ear pain or sore throat. Denies chest congestion, productive cough or wheezing. Denies chest pains, palpitations and leg swelling Denies abdominal pain, nausea, vomiting,diarrhea or constipation.   Denies dysuria, frequency, hesitancy or incontinence. . Denies headaches, seizures, numbness, or tingling. Denies depression, anxiety or insomnia. Denies skin break down or rash.   PE  BP (!) 220/120   Pulse 100   Resp 16   Ht 5\' 7"  (1.702 m)   Wt 192 lb (87.1 kg)   SpO2 96%   BMI 30.07 kg/m   Patient alert and oriented and in no cardiopulmonary distress.  HEENT: No facial asymmetry, EOMI,   oropharynx pink and moist.  Neck supple no JVD, no mass.  Chest: Clear to auscultation bilaterally.  CVS: S1, S2 no murmurs, no S3.Regular rate.  ABD: Soft non tender.   Ext: No edema  MS: Adequate ROM spine, shoulders, hips and reduced in right  Knee which is swollen and tender with crepitus.  Skin: Intact, no ulcerations or rash noted.  Psych: Good eye contact, normal affect. Memory intact not anxious or depressed appearing.  CNS: CN 2-12 intact, power,  normal throughout.no focal deficits noted.   Assessment & Plan  Knee pain, right Four week h/o right knee pain and swelling, no trauma, needs x ray and refer to ortho, apparent lipoma on lateral aspect  Malignant hypertension Uncontrolled , remians dangerousle elevated in  A patient who consistently reports intolerance to every medication she is  prescribed. He is to start amlodipine 5 mg daily and return in 5 weeks DASH diet and commitment to daily physical activity for a minimum of 30 minutes discussed and encouraged, as a part of hypertension management. The importance of attaining a healthy weight is also discussed.  BP/Weight 04/18/2017 04/16/2017 02/28/2017 09/27/2016 08/31/2016 07/28/2016 6/81/1572  Systolic BP 620 355 974 163 845 364 680  Diastolic BP 321 98 224 88 110 74 110  Wt. (Lbs) 192 192 185 184 189.75 - -  BMI 30.07 30.99 28.98 28.82 29.72 - -   She is re educated re the  Dangers of untreated hypertension    Hyperlipidemia LDL goal <100 Hyperlipidemia:Low fat diet discussed and encouraged.   Lipid Panel  Lab Results  Component Value Date   CHOL 214 (H) 03/07/2017   HDL 56 03/07/2017   LDLCALC 135 (H) 03/07/2017   TRIG 123 03/07/2017   CHOLHDL 3.8 03/07/2017   Uncontrolled     Overweight (BMI 25.0-29.9) Deteriorated. Patient re-educated about  the importance of commitment to a  minimum of 150 minutes of exercise per week.  The importance of healthy food choices with portion control discussed. Encouraged to start a food diary, count calories and to consider  joining a support group. Sample diet sheets offered. Goals set by the patient for the next several months.   Weight /BMI 04/18/2017 04/16/2017 02/28/2017  WEIGHT 192 lb 192 lb 185 lb  HEIGHT 5\' 7"  5\' 6"  5\' 7"   BMI 30.07 kg/m2 30.99 kg/m2 28.98 kg/m2      IGT (impaired glucose tolerance) Patient educated about the importance of limiting  Carbohydrate intake , the need to commit to daily physical activity for a minimum of 30 minutes , and to commit weight loss. The fact that changes in all these areas will reduce or eliminate all together the development of diabetes is stressed.  Deteriorated  Diabetic Labs Latest Ref Rng & Units 03/07/2017 07/22/2016 12/23/2015 03/23/2015 11/30/2014  HbA1c <5.7 % of total Hgb 5.7(H) - 5.4 5.6 5.9(H)  Chol <200 mg/dL  214(H) - 220(H) 215(H) 226(H)  HDL >50 mg/dL 56 - 59 61 62  Calc LDL mg/dL (calc) 135(H) - 137(H) 130(H) 148(H)  Triglycerides <150 mg/dL 123 - 119 121 79  Creatinine 0.60 - 0.93 mg/dL 1.05(H) 0.96(H) 0.90 0.89 1.09(H)   BP/Weight 04/18/2017 04/16/2017 02/28/2017 09/27/2016 08/31/2016 07/28/2016 5/40/0867  Systolic BP 619 509 326 712 458 099 833  Diastolic BP 825 98 053 88 110 74 110  Wt. (Lbs) 192 192 185 184 189.75 - -  BMI 30.07 30.99 28.98 28.82 29.72 - -   No flowsheet data found.    Metabolic syndrome The increased risk of cardiovascular disease associated with this diagnosis, and the need to consistently work on lifestyle to change this is discussed. Following  a  heart healthy diet ,commitment to 30 minutes of exercise at least 5 days per week, as well as control of blood sugar and cholesterol , and achieving a healthy weight are all the areas to be addressed .

## 2017-04-22 NOTE — Assessment & Plan Note (Signed)
Hyperlipidemia:Low fat diet discussed and encouraged.   Lipid Panel  Lab Results  Component Value Date   CHOL 214 (H) 03/07/2017   HDL 56 03/07/2017   LDLCALC 135 (H) 03/07/2017   TRIG 123 03/07/2017   CHOLHDL 3.8 03/07/2017   Uncontrolled

## 2017-04-22 NOTE — Assessment & Plan Note (Signed)
The increased risk of cardiovascular disease associated with this diagnosis, and the need to consistently work on lifestyle to change this is discussed. Following  a  heart healthy diet ,commitment to 30 minutes of exercise at least 5 days per week, as well as control of blood sugar and cholesterol , and achieving a healthy weight are all the areas to be addressed .  

## 2017-04-22 NOTE — Assessment & Plan Note (Signed)
Uncontrolled , remians dangerousle elevated in  A patient who consistently reports intolerance to every medication she is prescribed. He is to start amlodipine 5 mg daily and return in 5 weeks DASH diet and commitment to daily physical activity for a minimum of 30 minutes discussed and encouraged, as a part of hypertension management. The importance of attaining a healthy weight is also discussed.  BP/Weight 04/18/2017 04/16/2017 02/28/2017 09/27/2016 08/31/2016 07/28/2016 7/34/1937  Systolic BP 902 409 735 329 924 268 341  Diastolic BP 962 98 229 88 110 74 110  Wt. (Lbs) 192 192 185 184 189.75 - -  BMI 30.07 30.99 28.98 28.82 29.72 - -   She is re educated re the  Dangers of untreated hypertension

## 2017-04-22 NOTE — Assessment & Plan Note (Signed)
Patient educated about the importance of limiting  Carbohydrate intake , the need to commit to daily physical activity for a minimum of 30 minutes , and to commit weight loss. The fact that changes in all these areas will reduce or eliminate all together the development of diabetes is stressed.  Deteriorated  Diabetic Labs Latest Ref Rng & Units 03/07/2017 07/22/2016 12/23/2015 03/23/2015 11/30/2014  HbA1c <5.7 % of total Hgb 5.7(H) - 5.4 5.6 5.9(H)  Chol <200 mg/dL 214(H) - 220(H) 215(H) 226(H)  HDL >50 mg/dL 56 - 59 61 62  Calc LDL mg/dL (calc) 135(H) - 137(H) 130(H) 148(H)  Triglycerides <150 mg/dL 123 - 119 121 79  Creatinine 0.60 - 0.93 mg/dL 1.05(H) 0.96(H) 0.90 0.89 1.09(H)   BP/Weight 04/18/2017 04/16/2017 02/28/2017 09/27/2016 08/31/2016 07/28/2016 0/13/1438  Systolic BP 887 579 728 206 015 615 379  Diastolic BP 432 98 761 88 110 74 110  Wt. (Lbs) 192 192 185 184 189.75 - -  BMI 30.07 30.99 28.98 28.82 29.72 - -   No flowsheet data found.

## 2017-04-22 NOTE — Assessment & Plan Note (Signed)
Deteriorated. Patient re-educated about  the importance of commitment to a  minimum of 150 minutes of exercise per week.  The importance of healthy food choices with portion control discussed. Encouraged to start a food diary, count calories and to consider  joining a support group. Sample diet sheets offered. Goals set by the patient for the next several months.   Weight /BMI 04/18/2017 04/16/2017 02/28/2017  WEIGHT 192 lb 192 lb 185 lb  HEIGHT 5\' 7"  5\' 6"  5\' 7"   BMI 30.07 kg/m2 30.99 kg/m2 28.98 kg/m2

## 2017-05-04 ENCOUNTER — Encounter: Payer: Self-pay | Admitting: Orthopedic Surgery

## 2017-05-08 ENCOUNTER — Ambulatory Visit: Payer: Medicare HMO | Admitting: Family Medicine

## 2017-06-04 ENCOUNTER — Ambulatory Visit: Payer: Medicare HMO | Admitting: Orthopedic Surgery

## 2017-06-04 VITALS — BP 199/112 | HR 84 | Ht 66.0 in | Wt 188.0 lb

## 2017-06-04 DIAGNOSIS — M1711 Unilateral primary osteoarthritis, right knee: Secondary | ICD-10-CM

## 2017-06-04 MED ORDER — DICLOFENAC SODIUM 75 MG PO TBEC
75.0000 mg | DELAYED_RELEASE_TABLET | Freq: Two times a day (BID) | ORAL | 2 refills | Status: DC
Start: 2017-06-04 — End: 2017-06-12

## 2017-06-04 NOTE — Patient Instructions (Signed)

## 2017-06-04 NOTE — Progress Notes (Signed)
Progress Note - NEW PROBLEM   Patient ID: Sara Haynes, female   DOB: 09-26-1945, 72 y.o.   MRN: [id number] Chief Complaint  Patient presents with  . Knee Pain    Right knee pain, no injury. Referred by Dr. Moshe Cipro.        Medical decision-making  Imaging:  Xrays ordered: y/n ? YES  Please see the x-ray report  The x-ray shows mild arthritis of the lateral compartment x-ray was done at [facility].  Encounter Diagnosis  Name Primary?  . Primary osteoarthritis of right knee Yes     PLAN:   Start anti-inflammatory medication take twice a day for the first week then as needed Aspiration injection right knee  Aspiration was negative we injected 40 mg of Depo-Medrol  Procedure note right knee injection verbal consent was obtained to inject right knee joint  Timeout was completed to confirm the site of injection  The medications used were 40 mg of Depo-Medrol and 1% lidocaine 3 cc  Anesthesia was provided by ethyl chloride and the skin was prepped with alcohol.  After cleaning the skin with alcohol a 20-gauge needle was used to inject the right knee joint. There were no complications. A sterile bandage was applied.    FURTHER WORKUP: None  Meds ordered this encounter  Medications  . diclofenac (VOLTAREN) 75 MG EC tablet    Sig: Take 1 tablet (75 mg total) by mouth 2 (two) times daily with a meal.    Dispense:  60 tablet    Refill:  2      Chief Complaint  Patient presents with  . Knee Pain    Right knee pain, no injury. Referred by Dr. Moshe Cipro.    72 year old female comes in for evaluation of swelling and pain right knee  She is 72 years old she denies any trauma she complains of dull aching pain in the right knee joint for the last 4 months associated with swelling which did respond to ice with decrease in total amount of swelling and some residual.  No treatment has been started at this time    Review of Systems  Respiratory: Negative  for shortness of breath.   Cardiovascular: Negative for chest pain.  Neurological: Negative for tingling.   No outpatient medications have been marked as taking for the 06/04/17 encounter (Office Visit) with Carole Civil, MD.    Past Medical History:  Diagnosis Date  . Abnormal electrocardiogram 02/03/2010   Sinus bradycardia  . Anemia   . Hyperlipidemia 11/04/2010  . Hypertension 1985   Never successfully treated.  . Medication intolerance   . Tinnitus of both ears      Allergies  Allergen Reactions  . Chlorthalidone Other (See Comments)    States medication is drunk and dizzy  . Amlodipine     All over cramps  . Hydralazine Other (See Comments)  . Hydrochlorothiazide W-Triamterene     REACTION: cramps  . Lisinopril     Dizzy and leg cramps   . Spironolactone Other (See Comments)    muscle cramps    BP (!) 199/112   Pulse 84   Ht 5\' 6"  (1.676 m)   Wt 188 lb (85.3 kg)   BMI 30.34 kg/m    Physical Exam  Constitutional: She is oriented to person, place, and time. She appears well-developed and well-nourished.  Musculoskeletal:       Right knee: She exhibits effusion.  Neurological: She is alert and oriented to person, place,  and time.  Psychiatric: She has a normal mood and affect. Judgment normal.  Vitals reviewed.   Right Knee Exam   Muscle Strength  The patient has normal right knee strength.  Tenderness  Right knee tenderness location: Diffuse right knee tenderness no specific area.  Range of Motion  Extension: normal  Flexion:  120 normal   Tests  McMurray:  Medial - negative Lateral - negative Varus: negative Valgus: negative Drawer:  Anterior - negative    Posterior - negative  Other  Erythema: absent Scars: absent Sensation: normal Pulse: present Swelling: none Effusion: effusion present  Comments:  Trace joint effusion right knee   Left Knee Exam   Muscle Strength  The patient has normal left knee strength.  Tenderness   The patient is experiencing no tenderness.   Range of Motion  Extension: normal  Flexion:  130 normal   Tests  McMurray:  Medial - negative Lateral - negative Varus: negative Valgus: negative Drawer:  Anterior - negative     Posterior - negative  Other  Erythema: absent Scars: absent Sensation: normal Pulse: present Swelling: none      Arther Abbott, MD  9:20 AM 06/04/2017

## 2017-06-12 ENCOUNTER — Ambulatory Visit: Payer: Medicare HMO | Admitting: Family Medicine

## 2017-06-12 ENCOUNTER — Encounter: Payer: Self-pay | Admitting: Family Medicine

## 2017-06-12 VITALS — BP 220/120 | HR 79 | Resp 16 | Ht 66.0 in | Wt 190.0 lb

## 2017-06-12 DIAGNOSIS — E785 Hyperlipidemia, unspecified: Secondary | ICD-10-CM | POA: Diagnosis not present

## 2017-06-12 DIAGNOSIS — I10 Essential (primary) hypertension: Secondary | ICD-10-CM

## 2017-06-12 DIAGNOSIS — E669 Obesity, unspecified: Secondary | ICD-10-CM | POA: Diagnosis not present

## 2017-06-12 DIAGNOSIS — E8881 Metabolic syndrome: Secondary | ICD-10-CM

## 2017-06-12 DIAGNOSIS — R7302 Impaired glucose tolerance (oral): Secondary | ICD-10-CM

## 2017-06-12 DIAGNOSIS — Z789 Other specified health status: Secondary | ICD-10-CM

## 2017-06-12 NOTE — Patient Instructions (Signed)
Keep appointment for physical exam   Please let me know whixch cardiologist you want to see about your blood pressure please call before Thursday  It is important that you exercise regularly at least 30 minutes 5 times a week. If you develop chest pain, have severe difficulty breathing, or feel very tired, stop exercising immediately and seek medical attention    Please work on good  health habits so that your health will improve. 1. Commitment to daily physical activity for 30 to 60  minutes, if you are able to do this.  2. Commitment to wise food choices. Aim for half of your  food intake to be vegetable and fruit, one quarter starchy foods, and one quarter protein. Try to eat on a regular schedule  3 meals per day, snacking between meals should be limited to vegetables or fruits or small portions of nuts. 64 ounces of water per day is generally recommended, unless you have specific health conditions, like heart failure or kidney failure where you will need to limit fluid intake.  3. Commitment to sufficient and a  good quality of physical and mental rest daily, generally between 6 to 8 hours per day.  WITH PERSISTANCE AND PERSEVERANCE, THE IMPOSSIBLE , BECOMES THE NORM!

## 2017-06-12 NOTE — Progress Notes (Signed)
Sara Haynes     MRN: [id number]      DOB: 06/24/1945   HPI Sara Haynes is here for follow up of uncontrolled HTN Has not consistently taking her amlodipine, says that stopped thinking it hurts her leg,, says diclofenac prescribed by ortho  Made her feel totally out of it and hurt her , so she did not take that either States she gets her blood pressure totally normal at home sometimes , and sometimes high, feels that she knows when it is high, and she takes medication at that time and what she feels she can tolerate. States she never gets her BP above the 170's, and believes that her numbers in the office are driven up by white coat hypertension I told her that I do believe that what she is reporting is her reality , but that at the same time I cannot ignore the fact that in the office she consistently has very high SBP which I recommend be treated.  I suggest that we again get cardiology to help as she is very diligent in keeping return appointments , and does try to cooperate with medications suggested , but unfortunately ,is unable to tolerate most if not all recommended She is amenable to that.  I  Have asked her to suggests a cardiologist who she wants to see, and have also encouraged her to have a family member go with her. She is to call back/ come by with the updated informatio. I remind her that up to this time she is fourtunate in that her kidney function is fully preserved and the she has no symptoms of heart failure or stroke.   ROS Denies recent fever or chills. Denies sinus pressure, nasal congestion, ear pain or sore throat. Denies chest congestion, productive cough or wheezing. Denies chest pains, palpitations and leg swelling Denies abdominal pain, nausea, vomiting,diarrhea or constipation.   Denies dysuria, frequency, hesitancy or incontinence. C/o right knee pain and stiffness Denies headaches, seizures, numbness, or tingling. Denies depression, anxiety or  insomnia. Denies skin break down or rash.   PE  BP (!) 220/120   Pulse 79   Resp 16   Ht 5\' 6"  (1.676 m)   Wt 190 lb (86.2 kg)   SpO2 97%   BMI 30.67 kg/m   Patient alert and oriented and in no cardiopulmonary distress.  HEENT: No facial asymmetry, EOMI,   oropharynx pink and moist.  Neck supple no JVD, no mass.  Chest: Clear to auscultation bilaterally.  CVS: S1, S2 no murmurs, no S3.Regular rate.  ABD: Soft non tender.   Ext: No edema  MS: Adequate ROM spine, shoulders, hips an reduced in right knee.  Skin: Intact, no ulcerations or rash noted.  Psych: Good eye contact, normal affect. Memory intact not anxious or depressed appearing.  CNS: CN 2-12 intact, power,  normal throughout.no focal deficits noted.   Assessment & Plan  Malignant hypertension Uncontrolled and intolerant of multiple antihypertensives Needs cardiology to manage, patient to call back with the name of the cardiologist she wishes to be referred to, has seen 2 locally in the past DASH diet and commitment to daily physical activity for a minimum of 30 minutes discussed and encouraged, as a part of hypertension management. The importance of attaining a healthy weight is also discussed.  BP/Weight 06/12/2017 06/04/2017 04/18/2017 04/16/2017 02/28/2017 09/27/2016 8/34/1962  Systolic BP 229 798 921 194 174 081 448  Diastolic BP 185 631 497 98 120 88 110  Wt. (  Lbs) 190 188 192 192 185 184 189.75  BMI 30.67 30.34 30.07 30.99 28.98 28.82 29.72       Hyperlipidemia LDL goal <100 Not sat goal, needs to lower fat intake Hyperlipidemia:Low fat diet discussed and encouraged.   Lipid Panel  Lab Results  Component Value Date   CHOL 214 (H) 03/07/2017   HDL 56 03/07/2017   LDLCALC 135 (H) 03/07/2017   TRIG 123 03/07/2017   CHOLHDL 3.8 03/07/2017     '  IGT (impaired glucose tolerance) Patient educated about the importance of limiting  Carbohydrate intake , the need to commit to daily physical  activity for a minimum of 30 minutes , and to commit weight loss. The fact that changes in all these areas will reduce or eliminate all together the development of diabetes is stressed.   Diabetic Labs Latest Ref Rng & Units 03/07/2017 07/22/2016 12/23/2015 03/23/2015 11/30/2014  HbA1c <5.7 % of total Hgb 5.7(H) - 5.4 5.6 5.9(H)  Chol <200 mg/dL 214(H) - 220(H) 215(H) 226(H)  HDL >50 mg/dL 56 - 59 61 62  Calc LDL mg/dL (calc) 135(H) - 137(H) 130(H) 148(H)  Triglycerides <150 mg/dL 123 - 119 121 79  Creatinine 0.60 - 0.93 mg/dL 1.05(H) 0.96(H) 0.90 0.89 1.09(H)   BP/Weight 06/12/2017 06/04/2017 04/18/2017 04/16/2017 02/28/2017 09/27/2016 8/67/6720  Systolic BP 947 096 283 662 947 654 650  Diastolic BP 354 656 812 98 120 88 110  Wt. (Lbs) 190 188 192 192 185 184 189.75  BMI 30.67 30.34 30.07 30.99 28.98 28.82 29.72   No flowsheet data found.    Metabolic syndrome The increased risk of cardiovascular disease associated with this diagnosis, and the need to consistently work on lifestyle to change this is discussed. Following  a  heart healthy diet ,commitment to 30 minutes of exercise at least 5 days per week, as well as control of blood sugar and cholesterol , and achieving a healthy weight are all the areas to be addressed .   Obesity (BMI 30.0-34.9) Deteriorated. Patient re-educated about  the importance of commitment to a  minimum of 150 minutes of exercise per week.  The importance of healthy food choices with portion control discussed. Encouraged to start a food diary, count calories and to consider  joining a support group. Sample diet sheets offered. Goals set by the patient for the next several months.   Weight /BMI 06/12/2017 06/04/2017 04/18/2017  WEIGHT 190 lb 188 lb 192 lb  HEIGHT 5\' 6"  5\' 6"  5\' 7"   BMI 30.67 kg/m2 30.34 kg/m2 30.07 kg/m2      Medication intolerance Reports intolerance to multiple different classes of  anti hypertensive agents at different strengths a real  challenge in this patient as this is her primary diagnosis

## 2017-06-17 ENCOUNTER — Encounter: Payer: Self-pay | Admitting: Family Medicine

## 2017-06-17 NOTE — Assessment & Plan Note (Signed)
Reports intolerance to multiple different classes of  anti hypertensive agents at different strengths a real challenge in this patient as this is her primary diagnosis

## 2017-06-17 NOTE — Assessment & Plan Note (Signed)
Uncontrolled and intolerant of multiple antihypertensives Needs cardiology to manage, patient to call back with the name of the cardiologist she wishes to be referred to, has seen 2 locally in the past DASH diet and commitment to daily physical activity for a minimum of 30 minutes discussed and encouraged, as a part of hypertension management. The importance of attaining a healthy weight is also discussed.  BP/Weight 06/12/2017 06/04/2017 04/18/2017 04/16/2017 02/28/2017 09/27/2016 09/02/5174  Systolic BP 160 737 106 269 485 462 703  Diastolic BP 500 938 182 98 120 88 110  Wt. (Lbs) 190 188 192 192 185 184 189.75  BMI 30.67 30.34 30.07 30.99 28.98 28.82 29.72

## 2017-06-17 NOTE — Assessment & Plan Note (Signed)
Patient educated about the importance of limiting  Carbohydrate intake , the need to commit to daily physical activity for a minimum of 30 minutes , and to commit weight loss. The fact that changes in all these areas will reduce or eliminate all together the development of diabetes is stressed.   Diabetic Labs Latest Ref Rng & Units 03/07/2017 07/22/2016 12/23/2015 03/23/2015 11/30/2014  HbA1c <5.7 % of total Hgb 5.7(H) - 5.4 5.6 5.9(H)  Chol <200 mg/dL 214(H) - 220(H) 215(H) 226(H)  HDL >50 mg/dL 56 - 59 61 62  Calc LDL mg/dL (calc) 135(H) - 137(H) 130(H) 148(H)  Triglycerides <150 mg/dL 123 - 119 121 79  Creatinine 0.60 - 0.93 mg/dL 1.05(H) 0.96(H) 0.90 0.89 1.09(H)   BP/Weight 06/12/2017 06/04/2017 04/18/2017 04/16/2017 02/28/2017 09/27/2016 08/26/2351  Systolic BP 614 431 540 086 761 950 932  Diastolic BP 671 245 809 98 120 88 110  Wt. (Lbs) 190 188 192 192 185 184 189.75  BMI 30.67 30.34 30.07 30.99 28.98 28.82 29.72   No flowsheet data found.

## 2017-06-17 NOTE — Assessment & Plan Note (Signed)
Deteriorated. Patient re-educated about  the importance of commitment to a  minimum of 150 minutes of exercise per week.  The importance of healthy food choices with portion control discussed. Encouraged to start a food diary, count calories and to consider  joining a support group. Sample diet sheets offered. Goals set by the patient for the next several months.   Weight /BMI 06/12/2017 06/04/2017 04/18/2017  WEIGHT 190 lb 188 lb 192 lb  HEIGHT 5\' 6"  5\' 6"  5\' 7"   BMI 30.67 kg/m2 30.34 kg/m2 30.07 kg/m2

## 2017-06-17 NOTE — Assessment & Plan Note (Signed)
Not sat goal, needs to lower fat intake Hyperlipidemia:Low fat diet discussed and encouraged.   Lipid Panel  Lab Results  Component Value Date   CHOL 214 (H) 03/07/2017   HDL 56 03/07/2017   LDLCALC 135 (H) 03/07/2017   TRIG 123 03/07/2017   CHOLHDL 3.8 03/07/2017     '

## 2017-06-17 NOTE — Assessment & Plan Note (Signed)
The increased risk of cardiovascular disease associated with this diagnosis, and the need to consistently work on lifestyle to change this is discussed. Following  a  heart healthy diet ,commitment to 30 minutes of exercise at least 5 days per week, as well as control of blood sugar and cholesterol , and achieving a healthy weight are all the areas to be addressed .  

## 2017-08-01 ENCOUNTER — Other Ambulatory Visit: Payer: Self-pay

## 2017-08-01 ENCOUNTER — Encounter: Payer: Self-pay | Admitting: Family Medicine

## 2017-08-01 ENCOUNTER — Ambulatory Visit (INDEPENDENT_AMBULATORY_CARE_PROVIDER_SITE_OTHER): Payer: Medicare HMO | Admitting: Family Medicine

## 2017-08-01 VITALS — BP 220/120 | HR 88 | Resp 14 | Ht 66.5 in | Wt 190.1 lb

## 2017-08-01 DIAGNOSIS — Z0000 Encounter for general adult medical examination without abnormal findings: Secondary | ICD-10-CM | POA: Diagnosis not present

## 2017-08-01 DIAGNOSIS — I10 Essential (primary) hypertension: Secondary | ICD-10-CM

## 2017-08-01 NOTE — Patient Instructions (Signed)
F/u  In 4 month,with MD call if you need me sooner  I am referring you to Cardiologist , Dr Harrington Challenger for help with BP   You will get 3 stool card for colon cancer screening please return them as soon as possible  Bring your blood pressure cuff tomorrow , I will check it  Take your blood pressure medication today ad every day

## 2017-08-01 NOTE — Progress Notes (Signed)
    Sara Haynes     MRN: [id number]      DOB: 02/20/1945  HPI: Patient is in for annual physical exam. Her uncontrolled blood pressure is persistent, she remains inconsistent in taking her medication, maybe  2 times / week  and states her cuff reads 120/80. When asked to return today with the cuff states she will bring in am when batteries are replaced. Refuses ED management and indeed her BP is consistently elevated, I again warn of stroke risk and invite family involvement  Immunization is reviewed , and  updated if needed.   PE: BP (!) 220/120   Pulse 88   Resp 14   Ht 5' 6.5" (1.689 m)   Wt 190 lb 1.9 oz (86.2 kg)   SpO2 98%   BMI 30.23 kg/m   Pleasant  female, alert and oriented x 3, Afebrile. HEENT No facial trauma or asymetry. Sinuses non tender.  Extra occullar muscles intact, pupils equally reactive to light. External ears normal, tympanic membranes clear. Oropharynx moist, no exudate. Neck: supple, no adenopathy,JVD or thyromegaly.No bruits.  Chest: Clear to ascultation bilaterally.No crackles or wheezes. Non tender to palpation  Breast: No asymetry,no masses or lumps. No tenderness. No nipple discharge or inversion. No axillary or supraclavicular adenopathy  Cardiovascular system; Heart sounds normal,  S1 and  S2 ,no S3.  No murmur, or thrill. Apical beat not displaced Peripheral pulses normal.  Abdomen: Soft, non tender, no organomegaly or masses. No bruits. Bowel sounds normal. No guarding, tenderness or rebound.  Pt received 3 stool cards to return, she has had polyps  GU: No complaint, not examined, will get 3rd pap in 2020   Musculoskeletal exam: Full ROM of spine, hips , shoulders and knees. No deformity ,swelling or crepitus noted. No muscle wasting or atrophy.   Neurologic: Cranial nerves 2 to 12 intact. Power, tone ,sensation and reflexes normal throughout. No disturbance in gait. No tremor.  Skin: Intact, no ulceration,  erythema , scaling or rash noted. Pigmentation normal throughout  Psych; Normal mood and affect. Judgement and concentration normal   Assessment & Plan:  Annual physical exam Annual exam as documented. Counseling done  re healthy lifestyle involving commitment to 150 minutes exercise per week, heart healthy diet, and attaining healthy weight.The importance of adequate sleep also discussed. . Immunization and cancer screening needs are specifically addressed at this visit.   Malignant hypertension Unchanged, and patient remains non compliant as far as taking her medication is concerned, reporting "normal" pressures at home. Agrees to cardiology evaluation in [place], has been to [place] office twice, does  Not want to return, will refer to Dr Harrington Challenger I have advised her to take her medication today and to return with her BP cuff in the ,morning to have me check her BP

## 2017-08-01 NOTE — Assessment & Plan Note (Addendum)
Unchanged, and patient remains non compliant as far as taking her medication is concerned, reporting "normal" pressures at home. Agrees to cardiology evaluation in [place], has been to [place] office twice, does  Not want to return, will refer to Dr Harrington Challenger I have advised her to take her medication today and to return with her BP cuff in the ,morning to have me check her BP

## 2017-08-01 NOTE — Assessment & Plan Note (Signed)
Annual exam as documented. Counseling done  re healthy lifestyle involving commitment to 150 minutes exercise per week, heart healthy diet, and attaining healthy weight.The importance of adequate sleep also discussed.  Immunization and cancer screening needs are specifically addressed at this visit.  

## 2017-08-10 ENCOUNTER — Other Ambulatory Visit (INDEPENDENT_AMBULATORY_CARE_PROVIDER_SITE_OTHER): Payer: Medicare HMO

## 2017-08-10 DIAGNOSIS — Z1211 Encounter for screening for malignant neoplasm of colon: Secondary | ICD-10-CM

## 2017-08-10 LAB — HEMOCCULT GUIAC POC 1CARD (OFFICE)
Card #2 Fecal Occult Blod, POC: NEGATIVE
Card #3 Fecal Occult Blood, POC: NEGATIVE
FECAL OCCULT BLD: NEGATIVE

## 2017-08-31 ENCOUNTER — Ambulatory Visit: Payer: Medicare HMO | Admitting: Physician Assistant

## 2017-09-26 NOTE — Progress Notes (Deleted)
Cardiology Office Note    Date:  09/26/2017   ID:  Oneida, Mckamey 1945/08/19, MRN [id number]  PCP:  Fayrene Helper, MD  Cardiologist: No primary care provider on file. EPS: None  No chief complaint on file.   History of Present Illness:  Sara Haynes is a 72 y.o. female who is being seen today for the evaluation of hypertension at the request of Fayrene Helper, MD. Patient has history of malignant hypertension that is been very difficult to treat because of medication intolerances.  She has had adverse reactions in the past to amlodipine, hydralazine, HCTZ, lisinopril and Spironolactone.  She last saw Dr. Bronson Ing 02/28/2016 at which time he put her on carvedilol but she stopped it because she said her blood pressure was normal.  Renal duplex was normal in 2000 thirteen 2D echo 2013 normal LVEF 55% with mild LVH and mild aortic and mitral regurgitation.  Patient's blood pressure was 220/120 when seen by Dr. Moshe Cipro on 08/01/2017.  Patient remains noncompliant with taking any of her medications because she says is her pressures are normal at home.  Past Medical History:  Diagnosis Date  . Abnormal electrocardiogram 02/03/2010   Sinus bradycardia  . Anemia   . Hyperlipidemia 11/04/2010  . Hypertension 1985   Never successfully treated.  . Medication intolerance   . Tinnitus of both ears     Past Surgical History:  Procedure Laterality Date  . ARM SKIN LESION BIOPSY / EXCISION Right    fatty tumor  . CATARACT EXTRACTION  2011   Bilateral  . COLONOSCOPY    . COLONOSCOPY  09/20/2011   Procedure: COLONOSCOPY;  Surgeon: Rogene Houston, MD;  Location: [facility];  Service: Endoscopy;  Laterality: N/A;  930  . EYE SURGERY Right 11/2010   cataract extraction  . EYE SURGERY Left 01/2011   cataract extraction  . HYSTEROSCOPY W/D&C N/A 05/13/2013   Procedure: DILATATION AND CURETTAGE /HYSTEROSCOPY;  Surgeon: Jonnie Kind, MD;  Location: [facility] ORS;  Service:  Gynecology;  Laterality: N/A;  . POLYPECTOMY N/A 05/13/2013   Procedure: POLYPECTOMY (removal endometrial polyp);  Surgeon: Jonnie Kind, MD;  Location: [facility];  Service: Gynecology;  Laterality: N/A;  . TUBAL LIGATION  1977    Current Medications: No outpatient medications have been marked as taking for the 10/01/17 encounter (Appointment) with Imogene Burn, PA-C.     Allergies:   Chlorthalidone; Amlodipine; Hydralazine; Hydrochlorothiazide w-triamterene; Lisinopril; and Spironolactone   Social History   Socioeconomic History  . Marital status: Married    Spouse name: Not on file  . Number of children: 3  . Years of education: Not on file  . Highest education level: Not on file  Occupational History  . Occupation: Retired  Scientific laboratory technician  . Financial resource strain: Not hard at all  . Food insecurity:    Worry: Never true    Inability: Never true  . Transportation needs:    Medical: No    Non-medical: No  Tobacco Use  . Smoking status: Never Smoker  . Smokeless tobacco: Never Used  Substance and Sexual Activity  . Alcohol use: No  . Drug use: No  . Sexual activity: Never    Birth control/protection: Post-menopausal  Lifestyle  . Physical activity:    Days per week: 3 days    Minutes per session: 30 min  . Stress: Not at all  Relationships  . Social connections:    Talks on phone: Twice  a week    Gets together: Once a week    Attends religious service: More than 4 times per year    Active member of club or organization: No    Attends meetings of clubs or organizations: Never    Relationship status: Married  Other Topics Concern  . Not on file  Social History Narrative  . Not on file     Family History:  The patient's ***family history includes Alcohol abuse in her father; COPD in her mother; Diabetes in her brother and mother; Hypertension in her brother, brother, brother, sister, sister, and sister; Pneumonia in her father.   ROS:   Please see the  history of present illness.    ROS All other systems reviewed and are negative.   PHYSICAL EXAM:   VS:  There were no vitals taken for this visit.  Physical Exam  GEN: Well nourished, well developed, in no acute distress  HEENT: normal  Neck: no JVD, carotid bruits, or masses Cardiac:RRR; no murmurs, rubs, or gallops  Respiratory:  clear to auscultation bilaterally, normal work of breathing GI: soft, nontender, nondistended, + BS Ext: without cyanosis, clubbing, or edema, Good distal pulses bilaterally MS: no deformity or atrophy  Skin: warm and dry, no rash Neuro:  Alert and Oriented x 3, Strength and sensation are intact Psych: euthymic mood, full affect  Wt Readings from Last 3 Encounters:  08/01/17 190 lb 1.9 oz (86.2 kg)  06/12/17 190 lb (86.2 kg)  06/04/17 188 lb (85.3 kg)      Studies/Labs Reviewed:   EKG:  EKG is*** ordered today.  The ekg ordered today demonstrates ***  Recent Labs: 03/07/2017: ALT 11; BUN 29; Creat 1.05; Hemoglobin 11.0; Platelets 178; Potassium 3.9; Sodium 139; TSH 3.16   Lipid Panel    Component Value Date/Time   CHOL 214 (H) 03/07/2017 1028   TRIG 123 03/07/2017 1028   HDL 56 03/07/2017 1028   CHOLHDL 3.8 03/07/2017 1028   VLDL 24 12/23/2015 1250   LDLCALC 135 (H) 03/07/2017 1028    Additional studies/ records that were reviewed today include:  ***    ASSESSMENT:    No diagnosis found.   PLAN:  In order of problems listed above:      Medication Adjustments/Labs and Tests Ordered: Current medicines are reviewed at length with the patient today.  Concerns regarding medicines are outlined above.  Medication changes, Labs and Tests ordered today are listed in the Patient Instructions below. There are no Patient Instructions on file for this visit.   Sumner Boast, PA-C  09/26/2017 3:33 PM    [facility] [address], [place], [place]  [address] Phone: [phone]; Fax: [phone]

## 2017-10-01 ENCOUNTER — Ambulatory Visit: Payer: Medicare HMO | Admitting: Physician Assistant

## 2017-12-04 ENCOUNTER — Ambulatory Visit: Payer: Medicare HMO | Admitting: Family Medicine

## 2018-07-23 ENCOUNTER — Other Ambulatory Visit: Payer: Self-pay

## 2018-07-23 ENCOUNTER — Encounter (INDEPENDENT_AMBULATORY_CARE_PROVIDER_SITE_OTHER): Payer: Self-pay

## 2018-07-23 ENCOUNTER — Ambulatory Visit (INDEPENDENT_AMBULATORY_CARE_PROVIDER_SITE_OTHER): Payer: Medicare HMO | Admitting: Family Medicine

## 2018-07-23 ENCOUNTER — Encounter: Payer: Self-pay | Admitting: Family Medicine

## 2018-07-23 VITALS — BP 180/108 | HR 110 | Temp 98.4°F | Resp 14 | Ht 66.5 in | Wt 192.0 lb

## 2018-07-23 DIAGNOSIS — E559 Vitamin D deficiency, unspecified: Secondary | ICD-10-CM | POA: Diagnosis not present

## 2018-07-23 DIAGNOSIS — Z789 Other specified health status: Secondary | ICD-10-CM

## 2018-07-23 DIAGNOSIS — E669 Obesity, unspecified: Secondary | ICD-10-CM | POA: Diagnosis not present

## 2018-07-23 DIAGNOSIS — I10 Essential (primary) hypertension: Secondary | ICD-10-CM | POA: Diagnosis not present

## 2018-07-23 DIAGNOSIS — Z1239 Encounter for other screening for malignant neoplasm of breast: Secondary | ICD-10-CM

## 2018-07-23 DIAGNOSIS — E785 Hyperlipidemia, unspecified: Secondary | ICD-10-CM | POA: Diagnosis not present

## 2018-07-23 DIAGNOSIS — R7302 Impaired glucose tolerance (oral): Secondary | ICD-10-CM

## 2018-07-23 MED ORDER — CLONIDINE HCL 0.2 MG PO TABS: ORAL_TABLET | ORAL | 2 refills | Status: DC

## 2018-07-23 NOTE — Patient Instructions (Signed)
Annual physical exam with MD in 5 weeks.  Please schedule mammogram at checkout.  Fasting labs today CBC Cmp and EGFR lipid profile TSH and vitamin D.  New for blood pressure is clonidine 1 tablet at 10 PM every night.  Social distancing. Frequent hand washing with soap and water Keeping your hands off of your face. These 3 practices will help to keep both you and your community healthy during this time. Please practice them faithfully!   Thanks for choosing [facility], we consider it a privelige to serve you.

## 2018-07-24 LAB — CBC
HCT: 33.4 % — ABNORMAL LOW (ref 35.0–45.0)
Hemoglobin: 11 g/dL — ABNORMAL LOW (ref 11.7–15.5)
MCH: 29.7 pg (ref 27.0–33.0)
MCHC: 32.9 g/dL (ref 32.0–36.0)
MCV: 90.3 fL (ref 80.0–100.0)
MPV: 12.1 fL (ref 7.5–12.5)
Platelets: 178 10*3/uL (ref 140–400)
RBC: 3.7 10*6/uL — ABNORMAL LOW (ref 3.80–5.10)
RDW: 13.1 % (ref 11.0–15.0)
WBC: 3.7 10*3/uL — ABNORMAL LOW (ref 3.8–10.8)

## 2018-07-24 LAB — LIPID PANEL
Cholesterol: 231 mg/dL — ABNORMAL HIGH (ref <200)
HDL: 56 mg/dL (ref >=50)
LDL Cholesterol (Calc): 155 mg/dL (calc) — ABNORMAL HIGH
Non-HDL Cholesterol (Calc): 175 mg/dL (calc) — ABNORMAL HIGH (ref <130)
Total CHOL/HDL Ratio: 4.1 (calc) (ref <5.0)
Triglycerides: 95 mg/dL (ref <150)

## 2018-07-24 LAB — COMPLETE METABOLIC PANEL WITH GFR
AG Ratio: 1.3 (calc) (ref 1.0–2.5)
ALT: 13 U/L (ref 6–29)
AST: 20 U/L (ref 10–35)
Albumin: 4.4 g/dL (ref 3.6–5.1)
Alkaline phosphatase (APISO): 59 U/L (ref 37–153)
BUN: 13 mg/dL (ref 7–25)
CO2: 30 mmol/L (ref 20–32)
Calcium: 9.5 mg/dL (ref 8.6–10.4)
Chloride: 108 mmol/L (ref 98–110)
Creat: 0.83 mg/dL (ref 0.60–0.93)
GFR, Est African American: 81 mL/min/{1.73_m2} (ref >=60)
GFR, Est Non African American: 70 mL/min/{1.73_m2} (ref >=60)
Globulin: 3.3 g/dL (calc) (ref 1.9–3.7)
Glucose, Bld: 103 mg/dL — ABNORMAL HIGH (ref 65–99)
Potassium: 3.9 mmol/L (ref 3.5–5.3)
Sodium: 141 mmol/L (ref 135–146)
Total Bilirubin: 0.8 mg/dL (ref 0.2–1.2)
Total Protein: 7.7 g/dL (ref 6.1–8.1)

## 2018-07-24 LAB — VITAMIN D 25 HYDROXY (VIT D DEFICIENCY, FRACTURES): Vit D, 25-Hydroxy: 18 ng/mL — ABNORMAL LOW (ref 30–100)

## 2018-07-24 LAB — TSH: TSH: 1.85 mIU/L (ref 0.40–4.50)

## 2018-07-25 ENCOUNTER — Ambulatory Visit (INDEPENDENT_AMBULATORY_CARE_PROVIDER_SITE_OTHER): Payer: Medicare HMO | Admitting: Family Medicine

## 2018-07-25 ENCOUNTER — Encounter: Payer: Self-pay | Admitting: Family Medicine

## 2018-07-25 ENCOUNTER — Other Ambulatory Visit: Payer: Self-pay

## 2018-07-25 VITALS — BP 125/85 | Ht 66.0 in | Wt 190.0 lb

## 2018-07-25 DIAGNOSIS — Z0000 Encounter for general adult medical examination without abnormal findings: Secondary | ICD-10-CM

## 2018-07-25 NOTE — Progress Notes (Signed)
Subjective:   Sara Haynes is a 73 y.o. female who presents for Medicare Annual (Subsequent) preventive examination.  Location of Patient: Home Location of Provider: Telehealth Consent was obtain for visit to be over via telehealth.  I verified that I am speaking with the correct person using two identifiers.    Review of Systems:   Cardiac Risk Factors include: advanced age (>43men, >74 women);dyslipidemia;hypertension;obesity (BMI >30kg/m2)     Objective:     Vitals: BP 125/85   Ht 5\' 6"  (1.676 m)   Wt 190 lb (86.2 kg)   BMI 30.67 kg/m   Body mass index is 30.67 kg/m.  Advanced Directives 07/25/2018 04/16/2017 07/27/2016 01/13/2016 05/13/2013 05/07/2013 09/20/2011  Does Patient Have a Medical Advance Directive? No No No No Patient does not have advance directive;Patient would not like information Patient does not have advance directive;Patient would not like information Patient does not have advance directive;Patient would not like information  Would patient like information on creating a medical advance directive? No - Patient declined Yes (MAU/Ambulatory/Procedural Areas - Information given) - Yes (MAU/Ambulatory/Procedural Areas - Information given) - - -  Pre-existing out of facility DNR order (yellow form or pink MOST form) - - - - - No -    Tobacco Social History   Tobacco Use  Smoking Status Never Smoker  Smokeless Tobacco Never Used     Counseling given: Not Answered   Clinical Intake:     Pain : No/denies pain Pain Score: 0-No pain     Nutritional Status: BMI > 30  Obese Nutritional Risks: None Diabetes: No  How often do you need to have someone help you when you read instructions, pamphlets, or other written materials from your doctor or pharmacy?: 1 - Never What is the last grade level you completed in school?: 12  Interpreter Needed?: No     Past Medical History:  Diagnosis Date  . Abnormal electrocardiogram 02/03/2010   Sinus  bradycardia  . Anemia   . Hyperlipidemia 11/04/2010  . Hypertension 1985   Never successfully treated.  . Medication intolerance   . Tinnitus of both ears    Past Surgical History:  Procedure Laterality Date  . ARM SKIN LESION BIOPSY / EXCISION Right    fatty tumor  . CATARACT EXTRACTION  2011   Bilateral  . COLONOSCOPY    . COLONOSCOPY  09/20/2011   Procedure: COLONOSCOPY;  Surgeon: Sara Houston, MD;  Location: [facility];  Service: Endoscopy;  Laterality: N/A;  930  . EYE SURGERY Right 11/2010   cataract extraction  . EYE SURGERY Left 01/2011   cataract extraction  . HYSTEROSCOPY W/D&C N/A 05/13/2013   Procedure: DILATATION AND CURETTAGE /HYSTEROSCOPY;  Surgeon: Sara Kind, MD;  Location: [facility];  Service: Gynecology;  Laterality: N/A;  . POLYPECTOMY N/A 05/13/2013   Procedure: POLYPECTOMY (removal endometrial polyp);  Surgeon: Sara Kind, MD;  Location: [facility];  Service: Gynecology;  Laterality: N/A;  . TUBAL LIGATION  1977   Family History  Problem Relation Age of Onset  . COPD Mother   . Diabetes Mother   . Pneumonia Father   . Alcohol abuse Father   . Diabetes Brother   . Hypertension Brother   . Hypertension Sister   . Hypertension Brother   . Hypertension Sister   . Hypertension Sister   . Hypertension Brother    Social History   Socioeconomic History  . Marital status: Married    Spouse name: Not  on file  . Number of children: 3  . Years of education: Not on file  . Highest education level: Not on file  Occupational History  . Occupation: Retired  Scientific laboratory technician  . Financial resource strain: Not hard at all  . Food insecurity    Worry: Never true    Inability: Never true  . Transportation needs    Medical: No    Non-medical: No  Tobacco Use  . Smoking status: Never Smoker  . Smokeless tobacco: Never Used  Substance and Sexual Activity  . Alcohol use: No  . Drug use: No  . Sexual activity: Never    Birth control/protection:  Post-menopausal  Lifestyle  . Physical activity    Days per week: 3 days    Minutes per session: 30 min  . Stress: Not at all  Relationships  . Social Herbalist on phone: Twice a week    Gets together: Once a week    Attends religious service: More than 4 times per year    Active member of club or organization: No    Attends meetings of clubs or organizations: Never    Relationship status: Married  Other Topics Concern  . Not on file  Social History Narrative  . Not on file    Outpatient Encounter Medications as of 07/25/2018  Medication Sig  . amLODipine (NORVASC) 5 MG tablet Take 1 tablet (5 mg total) by mouth daily.  Marland Kitchen aspirin (ASPIRIN LOW DOSE) 81 MG EC tablet Take 81 mg by mouth daily.    . calcium-vitamin D (OSCAL 500/200 D-3) 500-200 MG-UNIT per tablet Take 1 tablet by mouth daily with breakfast.   . cloNIDine (CATAPRES) 0.2 MG tablet Take one tablet by mouth at 10 pm every evening  . Multiple Vitamins-Minerals (WOMENS MULTIVITAMIN PLUS) TABS Take 1 tablet by mouth daily.    No facility-administered encounter medications on file as of 07/25/2018.     Activities of Daily Living In your present state of health, do you have any difficulty performing the following activities: 07/25/2018  Hearing? N  Vision? N  Difficulty concentrating or making decisions? N  Walking or climbing stairs? N  Dressing or bathing? N  Doing errands, shopping? N  Preparing Food and eating ? N  Using the Toilet? N  In the past six months, have you accidently leaked urine? N  Do you have problems with loss of bowel control? N  Managing your Medications? N  Managing your Finances? N  Housekeeping or managing your Housekeeping? N  Some recent data might be hidden    Patient Care Team: Sara Helper, MD as PCP - General    Assessment:   This is a routine wellness examination for Sara Haynes.  Exercise Activities and Dietary recommendations Current Exercise Habits: Home  exercise routine, Type of exercise: walking, Time (Minutes): 30, Frequency (Times/Week): 7, Weekly Exercise (Minutes/Week): 210, Intensity: Mild, Exercise limited by: cardiac condition(s)  Goals    . Exercise 3x per week (30 min per time) (pt-stated)     I would like to start exercising 3 times a week 45 minutes at a time, starting today 01/13/2016.       Fall Risk Fall Risk  07/25/2018 07/23/2018 08/01/2017 04/16/2017 07/04/2016  Falls in the past year? 0 0 No No No  Number falls in past yr: 0 0 - - -  Injury with Fall? 0 0 - - -  Follow up Falls evaluation completed;Follow up appointment;Education provided;Falls prevention discussed - - - -  Is the patient's home free of loose throw rugs in walkways, pet beds, electrical cords, etc?   yes      Grab bars in the bathroom? yes      Handrails on the stairs?   yes      Adequate lighting?   yes  Timed Get Up and Go performed: telephone visit   Depression Screen PHQ 2/9 Scores 07/25/2018 07/23/2018 08/01/2017 04/16/2017  PHQ - 2 Score 0 0 0 0  PHQ- 9 Score - - - -     Cognitive Function     6CIT Screen 04/16/2017 01/13/2016  What Year? 0 points 0 points  What month? 0 points 0 points  What time? 0 points 0 points  Count back from 20 0 points 0 points  Months in reverse 2 points 0 points  Repeat phrase 2 points 0 points  Total Score 4 0    Immunization History  Administered Date(s) Administered  . Influenza Split 11/04/2010, 10/04/2011  . Influenza,inj,Quad PF,6+ Mos 09/27/2012, 10/02/2013, 12/15/2014, 09/13/2015, 08/31/2016  . Pneumococcal Conjugate-13 08/14/2013  . Pneumococcal Polysaccharide-23 12/02/2009  . Td 12/02/2009    Qualifies for Shingles Vaccine? Cost to much   Screening Tests Health Maintenance  Topic Date Due  . INFLUENZA VACCINE  08/03/2018  . MAMMOGRAM  03/08/2019  . TETANUS/TDAP  12/03/2019  . COLONOSCOPY  09/19/2021  . DEXA SCAN  Completed  . Hepatitis C Screening  Completed  . PNA vac Low Risk Adult   Completed    Cancer Screenings: Lung: Low Dose CT Chest recommended if Age 51-80 years, 30 pack-year currently smoking OR have quit w/in 15years. Patient does not qualify. Breast:  Up to date on Mammogram? Yes   Up to date of Bone Density/Dexa? Yes Colorectal: Due 2023  Additional Screenings:  Hepatitis C Screening: completed     Plan:    1. Encounter for Medicare annual wellness exam    I have personally reviewed and noted the following in the patient's chart:   . Medical and social history . Use of alcohol, tobacco or illicit drugs  . Current medications and supplements . Functional ability and status . Nutritional status . Physical activity . Advanced directives . List of other physicians . Hospitalizations, surgeries, and ER visits in previous 12 months . Vitals . Screenings to include cognitive, depression, and falls . Referrals and appointments  In addition, I have reviewed and discussed with patient certain preventive protocols, quality metrics, and best practice recommendations. A written personalized care plan for preventive services as well as general preventive health recommendations were provided to patient.    I provided 20 minutes of non-face-to-face time during this encounter.     Perlie Mayo, NP  07/25/2018

## 2018-07-25 NOTE — Patient Instructions (Signed)
Sara Haynes , Thank you for taking time to come for your Medicare Wellness Visit. I appreciate your ongoing commitment to your health goals. Please review the following plan we discussed and let me know if I can assist you in the future.   Please continue to practice social distancing to keep you, your family, and our community safe.  If you must go out, please wear a Mask and practice good handwashing.  Screening recommendations/referrals: Colonoscopy: Due 2023 Mammogram: Due 2021 Bone Density: Completed  Recommended yearly ophthalmology/optometry visit for glaucoma screening and checkup Recommended yearly dental visit for hygiene and checkup  Vaccinations: Influenza vaccine: Due Fall 2020 Pneumococcal vaccine: Completed  Tdap vaccine: Due 2021 Shingles vaccine: Cost too much  Advanced directives: You reported you have the paperwork, let us know if you need help with it  Conditions/risks identified: FALLS   Next appointment: 09/04/2018   Preventive Care 74 Years and Older, Female Preventive care refers to lifestyle choices and visits with your health care provider that can promote health and wellness. What does preventive care include?  A yearly physical exam. This is also called an annual well check.  Dental exams once or twice a year.  Routine eye exams. Ask your health care provider how often you should have your eyes checked.  Personal lifestyle choices, including:  Daily care of your teeth and gums.  Regular physical activity.  Eating a healthy diet.  Avoiding tobacco and drug use.  Limiting alcohol use.  Practicing safe sex.  Taking low-dose aspirin every day.  Taking vitamin and mineral supplements as recommended by your health care provider. What happens during an annual well check? The services and screenings done by your health care provider during your annual well check will depend on your age, overall health, lifestyle risk factors, and family history  of disease. Counseling  Your health care provider may ask you questions about your:  Alcohol use.  Tobacco use.  Drug use.  Emotional well-being.  Home and relationship well-being.  Sexual activity.  Eating habits.  History of falls.  Memory and ability to understand (cognition).  Work and work Statistician.  Reproductive health. Screening  You may have the following tests or measurements:  Height, weight, and BMI.  Blood pressure.  Lipid and cholesterol levels. These may be checked every 5 years, or more frequently if you are over 56 years old.  Skin check.  Lung cancer screening. You may have this screening every year starting at age 74 if you have a 30-pack-year history of smoking and currently smoke or have quit within the past 15 years.  Fecal occult blood test (FOBT) of the stool. You may have this test every year starting at age 74.  Flexible sigmoidoscopy or colonoscopy. You may have a sigmoidoscopy every 5 years or a colonoscopy every 10 years starting at age 35.  Hepatitis C blood test.  Hepatitis B blood test.  Sexually transmitted disease (STD) testing.  Diabetes screening. This is done by checking your blood sugar (glucose) after you have not eaten for a while (fasting). You may have this done every 1-3 years.  Bone density scan. This is done to screen for osteoporosis. You may have this done starting at age 74.  Mammogram. This may be done every 1-2 years. Talk to your health care provider about how often you should have regular mammograms. Talk with your health care provider about your test results, treatment options, and if necessary, the need for more tests. Vaccines  Your  health care provider may recommend certain vaccines, such as:  Influenza vaccine. This is recommended every year.  Tetanus, diphtheria, and acellular pertussis (Tdap, Td) vaccine. You may need a Td booster every 10 years.  Zoster vaccine. You may need this after age 12.   Pneumococcal 13-valent conjugate (PCV13) vaccine. One dose is recommended after age 74.  Pneumococcal polysaccharide (PPSV23) vaccine. One dose is recommended after age 74. Talk to your health care provider about which screenings and vaccines you need and how often you need them. This information is not intended to replace advice given to you by your health care provider. Make sure you discuss any questions you have with your health care provider. Document Released: 01/15/2015 Document Revised: 09/08/2015 Document Reviewed: 10/20/2014 Elsevier Interactive Patient Education  2017 [place] Prevention in the Home Falls can cause injuries. They can happen to people of all ages. There are many things you can do to make your home safe and to help prevent falls. What can I do on the outside of my home?  Regularly fix the edges of walkways and driveways and fix any cracks.  Remove anything that might make you trip as you walk through a door, such as a raised step or threshold.  Trim any bushes or trees on the path to your home.  Use bright outdoor lighting.  Clear any walking paths of anything that might make someone trip, such as rocks or tools.  Regularly check to see if handrails are loose or broken. Make sure that both sides of any steps have handrails.  Any raised decks and porches should have guardrails on the edges.  Have any leaves, snow, or ice cleared regularly.  Use sand or salt on walking paths during winter.  Clean up any spills in your garage right away. This includes oil or grease spills. What can I do in the bathroom?  Use night lights.  Install grab bars by the toilet and in the tub and shower. Do not use towel bars as grab bars.  Use non-skid mats or decals in the tub or shower.  If you need to sit down in the shower, use a plastic, non-slip stool.  Keep the floor dry. Clean up any water that spills on the floor as soon as it happens.  Remove soap  buildup in the tub or shower regularly.  Attach bath mats securely with double-sided non-slip rug tape.  Do not have throw rugs and other things on the floor that can make you trip. What can I do in the bedroom?  Use night lights.  Make sure that you have a light by your bed that is easy to reach.  Do not use any sheets or blankets that are too big for your bed. They should not hang down onto the floor.  Have a firm chair that has side arms. You can use this for support while you get dressed.  Do not have throw rugs and other things on the floor that can make you trip. What can I do in the kitchen?  Clean up any spills right away.  Avoid walking on wet floors.  Keep items that you use a lot in easy-to-reach places.  If you need to reach something above you, use a strong step stool that has a grab bar.  Keep electrical cords out of the way.  Do not use floor polish or wax that makes floors slippery. If you must use wax, use non-skid floor wax.  Do not  have throw rugs and other things on the floor that can make you trip. What can I do with my stairs?  Do not leave any items on the stairs.  Make sure that there are handrails on both sides of the stairs and use them. Fix handrails that are broken or loose. Make sure that handrails are as long as the stairways.  Check any carpeting to make sure that it is firmly attached to the stairs. Fix any carpet that is loose or worn.  Avoid having throw rugs at the top or bottom of the stairs. If you do have throw rugs, attach them to the floor with carpet tape.  Make sure that you have a light switch at the top of the stairs and the bottom of the stairs. If you do not have them, ask someone to add them for you. What else can I do to help prevent falls?  Wear shoes that:  Do not have high heels.  Have rubber bottoms.  Are comfortable and fit you well.  Are closed at the toe. Do not wear sandals.  If you use a stepladder:  Make  sure that it is fully opened. Do not climb a closed stepladder.  Make sure that both sides of the stepladder are locked into place.  Ask someone to hold it for you, if possible.  Clearly mark and make sure that you can see:  Any grab bars or handrails.  First and last steps.  Where the edge of each step is.  Use tools that help you move around (mobility aids) if they are needed. These include:  Canes.  Walkers.  Scooters.  Crutches.  Turn on the lights when you go into a dark area. Replace any light bulbs as soon as they burn out.  Set up your furniture so you have a clear path. Avoid moving your furniture around.  If any of your floors are uneven, fix them.  If there are any pets around you, be aware of where they are.  Review your medicines with your doctor. Some medicines can make you feel dizzy. This can increase your chance of falling. Ask your doctor what other things that you can do to help prevent falls. This information is not intended to replace advice given to you by your health care provider. Make sure you discuss any questions you have with your health care provider. Document Released: 10/15/2008 Document Revised: 05/27/2015 Document Reviewed: 01/23/2014 Elsevier Interactive Patient Education  2017 [employer].

## 2018-07-28 ENCOUNTER — Encounter: Payer: Self-pay | Admitting: Family Medicine

## 2018-07-28 NOTE — Assessment & Plan Note (Signed)
  Patient re-educated about  the importance of commitment to a  minimum of 150 minutes of exercise per week as able.  The importance of healthy food choices with portion control discussed, as well as eating regularly and within a 12 hour window most days. The need to choose "clean , green" food 50 to 75% of the time is discussed, as well as to make water the primary drink and set a goal of 64 ounces water daily.    Weight /BMI 07/25/2018 07/23/2018 08/01/2017  WEIGHT 190 lb 192 lb 190 lb 1.9 oz  HEIGHT 5\' 6"  5' 6.5" 5' 6.5"  BMI 30.67 kg/m2 30.53 kg/m2 30.23 kg/m2

## 2018-07-28 NOTE — Assessment & Plan Note (Signed)
Patient educated about the importance of limiting  Carbohydrate intake , the need to commit to daily physical activity for a minimum of 30 minutes , and to commit weight loss. The fact that changes in all these areas will reduce or eliminate all together the development of diabetes is stressed.  Updated lab needed at/ before next visit.   Diabetic Labs Latest Ref Rng & Units 07/23/2018 03/07/2017 07/22/2016 12/23/2015 03/23/2015  HbA1c <5.7 % of total Hgb - 5.7(H) - 5.4 5.6  Chol <200 mg/dL 231(H) 214(H) - 220(H) 215(H)  HDL > OR = 50 mg/dL 56 56 - 59 61  Calc LDL mg/dL (calc) 155(H) 135(H) - 137(H) 130(H)  Triglycerides <150 mg/dL 95 123 - 119 121  Creatinine 0.60 - 0.93 mg/dL 0.83 1.05(H) 0.96(H) 0.90 0.89   BP/Weight 07/25/2018 07/23/2018 08/01/2017 06/12/2017 06/04/2017 04/18/2017 9/97/7414  Systolic BP 239 532 023 343 [phone]  Diastolic BP 85 290 211 155 112 120 98  Wt. (Lbs) 190 192 190.12 190 188 192 192  BMI 30.67 30.53 30.23 30.67 30.34 30.07 30.99   No flowsheet data found.

## 2018-07-28 NOTE — Assessment & Plan Note (Signed)
Hyperlipidemia:Low fat diet discussed and encouraged.   Lipid Panel  Lab Results  Component Value Date   CHOL 231 (H) 07/23/2018   HDL 56 07/23/2018   LDLCALC 155 (H) 07/23/2018   TRIG 95 07/23/2018   CHOLHDL 4.1 07/23/2018  needs to reduce fried and fatty foods

## 2018-07-28 NOTE — Progress Notes (Signed)
Sara Haynes     MRN: [id number]      DOB: [birth year]   HPI Sara Haynes is here for follow up and re-evaluation of chronic medical conditions, medication management and review of any available recent lab and radiology data.  Preventive health is updated, specifically  Cancer screening and Immunization.   Has not been taking prescribed medication for BP and states often gets normal pressure at home   ROS Denies recent fever or chills. Denies sinus pressure, nasal congestion, ear pain or sore throat. Denies chest congestion, productive cough or wheezing. Denies chest pains, palpitations and leg swelling Denies abdominal pain, nausea, vomiting,diarrhea or constipation.   Denies dysuria, frequency, hesitancy or incontinence. Denies joint pain, swelling and limitation in mobility. Denies headaches, seizures, numbness, or tingling. Denies depression, anxiety or insomnia. Denies skin break down or rash.   PE  BP (!) 180/108   Pulse (!) 110   Temp 98.4 F (36.9 C) (Temporal)   Resp 14   Ht 5' 6.5" (1.689 m)   Wt 192 lb (87.1 kg)   SpO2 96%   BMI 30.53 kg/m    Patient alert and oriented and in no cardiopulmonary distress.  HEENT: No facial asymmetry, EOMI,   oropharynx pink and moist.  Neck supple no JVD, no mass.  Chest: Clear to auscultation bilaterally.  CVS: S1, S2 no murmurs, no S3.Regular rate.  ABD: Soft non tender.   Ext: No edema  MS: Adequate ROM spine, shoulders, hips and knees.  Skin: Intact, no ulcerations or rash noted.  Psych: Good eye contact, normal affect. Memory intact not anxious or depressed appearing.  CNS: CN 2-12 intact, power,  normal throughout.no focal deficits noted.   Assessment & Plan  Malignant hypertension Continues thave severe uncontrolled HTN and reports intolerance to meds, will try bedtime clonidine with close f/u DASH diet and commitment to daily physical activity for a minimum of 30 minutes discussed and encouraged,  as a part of hypertension management. The importance of attaining a healthy weight is also discussed.  BP/Weight 07/25/2018 07/23/2018 08/01/2017 06/12/2017 06/04/2017 04/18/2017 9/76/7341  Systolic BP 937 902 409 735 329 924 268  Diastolic BP 85 341 962 229 112 120 98  Wt. (Lbs) 190 192 190.12 190 188 192 192  BMI 30.67 30.53 30.23 30.67 30.34 30.07 30.99       Obesity (BMI 30.0-34.9)  Patient re-educated about  the importance of commitment to a  minimum of 150 minutes of exercise per week as able.  The importance of healthy food choices with portion control discussed, as well as eating regularly and within a 12 hour window most days. The need to choose "clean , green" food 50 to 75% of the time is discussed, as well as to make water the primary drink and set a goal of 64 ounces water daily.    Weight /BMI 07/25/2018 07/23/2018 08/01/2017  WEIGHT 190 lb 192 lb 190 lb 1.9 oz  HEIGHT 5\' 6"  5' 6.5" 5' 6.5"  BMI 30.67 kg/m2 30.53 kg/m2 30.23 kg/m2      Medication intolerance Ongoing and current challenge with management of her severe HTN  Hyperlipidemia LDL goal <100 Hyperlipidemia:Low fat diet discussed and encouraged.   Lipid Panel  Lab Results  Component Value Date   CHOL 231 (H) 07/23/2018   HDL 56 07/23/2018   LDLCALC 155 (H) 07/23/2018   TRIG 95 07/23/2018   CHOLHDL 4.1 07/23/2018  needs to reduce fried and fatty foods  IGT (impaired glucose tolerance) Patient educated about the importance of limiting  Carbohydrate intake , the need to commit to daily physical activity for a minimum of 30 minutes , and to commit weight loss. The fact that changes in all these areas will reduce or eliminate all together the development of diabetes is stressed.  Updated lab needed at/ before next visit.   Diabetic Labs Latest Ref Rng & Units 07/23/2018 03/07/2017 07/22/2016 12/23/2015 03/23/2015  HbA1c <5.7 % of total Hgb - 5.7(H) - 5.4 5.6  Chol <200 mg/dL 231(H) 214(H) - 220(H) 215(H)   HDL > OR = 50 mg/dL 56 56 - 59 61  Calc LDL mg/dL (calc) 155(H) 135(H) - 137(H) 130(H)  Triglycerides <150 mg/dL 95 123 - 119 121  Creatinine 0.60 - 0.93 mg/dL 0.83 1.05(H) 0.96(H) 0.90 0.89   BP/Weight 07/25/2018 07/23/2018 08/01/2017 06/12/2017 06/04/2017 04/18/2017 0/01/7492  Systolic BP 496 759 163 846 659 935 701  Diastolic BP 85 779 390 300 112 120 98  Wt. (Lbs) 190 192 190.12 190 188 192 192  BMI 30.67 30.53 30.23 30.67 30.34 30.07 30.99   No flowsheet data found.

## 2018-07-28 NOTE — Assessment & Plan Note (Signed)
Continues thave severe uncontrolled HTN and reports intolerance to meds, will try bedtime clonidine with close f/u DASH diet and commitment to daily physical activity for a minimum of 30 minutes discussed and encouraged, as a part of hypertension management. The importance of attaining a healthy weight is also discussed.  BP/Weight 07/25/2018 07/23/2018 08/01/2017 06/12/2017 06/04/2017 04/18/2017 7/82/9562  Systolic BP 130 865 784 696 295 284 132  Diastolic BP 85 440 102 725 112 120 98  Wt. (Lbs) 190 192 190.12 190 188 192 192  BMI 30.67 30.53 30.23 30.67 30.34 30.07 30.99

## 2018-07-28 NOTE — Assessment & Plan Note (Signed)
Ongoing and current challenge with management of her severe HTN

## 2018-08-01 ENCOUNTER — Ambulatory Visit (HOSPITAL_COMMUNITY)
Admission: RE | Admit: 2018-08-01 | Discharge: 2018-08-01 | Disposition: A | Discharge status: Home / Self Care | Payer: Medicare HMO | Source: Ambulatory Visit | Attending: Family Medicine | Admitting: Family Medicine

## 2018-08-01 ENCOUNTER — Other Ambulatory Visit: Payer: Self-pay

## 2018-08-01 DIAGNOSIS — Z1239 Encounter for other screening for malignant neoplasm of breast: Secondary | ICD-10-CM

## 2018-08-01 DIAGNOSIS — Z1231 Encounter for screening mammogram for malignant neoplasm of breast: Secondary | ICD-10-CM | POA: Insufficient documentation

## 2018-08-20 ENCOUNTER — Other Ambulatory Visit: Payer: Self-pay

## 2018-08-20 ENCOUNTER — Ambulatory Visit (INDEPENDENT_AMBULATORY_CARE_PROVIDER_SITE_OTHER): Payer: Medicare HMO | Admitting: Family Medicine

## 2018-08-20 ENCOUNTER — Encounter: Payer: Self-pay | Admitting: Family Medicine

## 2018-08-20 VITALS — BP 200/110 | HR 108 | Temp 98.2°F | Resp 15 | Ht 66.0 in | Wt 189.0 lb

## 2018-08-20 DIAGNOSIS — Z0000 Encounter for general adult medical examination without abnormal findings: Secondary | ICD-10-CM | POA: Diagnosis not present

## 2018-08-20 DIAGNOSIS — Z789 Other specified health status: Secondary | ICD-10-CM

## 2018-08-20 DIAGNOSIS — I10 Essential (primary) hypertension: Secondary | ICD-10-CM | POA: Diagnosis not present

## 2018-08-20 MED ORDER — CLONIDINE HCL 0.2 MG PO TABS: ORAL_TABLET | ORAL | 5 refills | Status: DC

## 2018-08-20 MED ORDER — CALCIUM CARBONATE-VITAMIN D 500-200 MG-UNIT PO TABS: 1.0000 | ORAL_TABLET | Freq: Two times a day (BID) | ORAL | 11 refills | Status: AC

## 2018-08-20 NOTE — Assessment & Plan Note (Signed)

## 2018-08-20 NOTE — Patient Instructions (Signed)
F/U with MD mid January, call if you need me before  Call and come for flu vaccine in 1 to 2 weeks  Please take clonidine for blood pressure at 8 pm every night  Please take calcium with D 500/200 one tablet two times daily  Stay on aspirin 81 mg daily  Please cut back on canned and fatty foods, increase vegetables , fruit and beans  Please limit salt  It is important that you exercise regularly at least 30 minutes 5 times a week. If you develop chest pain, have severe difficulty breathing, or feel very tired, stop exercising immediately and seek medical attention  Thanks for choosing [facility], we consider it a privelige to serve you.

## 2018-08-20 NOTE — Progress Notes (Signed)
    Sara Haynes     MRN: [id number]      DOB: May 21, 1945  HPI: Patient is in for annual physical exam. Uncontrolled hypertension is discussed Recent labs, if available are reviewed. Immunization is reviewed , and  updated if needed.   PE: BP (!) 200/110   Pulse (!) 108   Temp 98.2 F (36.8 C) (Temporal)   Resp 15   Ht 5\' 6"  (1.676 m)   Wt 189 lb (85.7 kg)   SpO2 97%   BMI 30.51 kg/m   Pleasant  female, alert and oriented x 3, in no cardio-pulmonary distress. Afebrile. HEENT No facial trauma or asymetry. Sinuses non tender.  Extra occullar muscles intact, pupils equally reactive to light. External ears normal,  Neck: supple, no adenopathy,JVD or thyromegaly.No bruits.  Chest: Clear to ascultation bilaterally.No crackles or wheezes. Non tender to palpation  Breast: No ot examined, normal mammogram 08/01/2018. Cardiovascular system; Heart sounds normal,  S1 and  S2 ,no S3.  No murmur, or thrill. Apical beat not displaced Peripheral pulses normal.  Abdomen: Soft, non tender,  No guarding, tenderness or rebound.   Musculoskeletal exam: Full ROM of spine, hips , shoulders and knees. No deformity ,swelling or crepitus noted. No muscle wasting or atrophy.   Neurologic: Cranial nerves 2 to 12 intact. Power, tone ,sensation and reflexes normal throughout. No disturbance in gait. No tremor.  Skin: Intact, no ulceration, erythema , scaling or rash noted. Pigmentation normal throughout  Psych; Normal mood and affect. Judgement and concentration normal   Assessment & Plan:  Annual physical exam Annual exam as documented. Counseling done  re healthy lifestyle involving commitment to 150 minutes exercise per week, heart healthy diet, and attaining healthy weight.The importance of adequate sleep also discussed. Regular seat belt use and home safety, is also discussed. Changes in health habits are decided on by the patient with goals and time frames  set for  achieving them. Immunization and cancer screening needs are specifically addressed at this visit.   Medication intolerance Reports intolerance to regime for antihyprtensives  Malignant hypertension Uncontrolled and severe. Encouraged pt to consitently take herbed time clonidine. She has not returned for cardiology evaluation despite referral, states repatedly that the medications make her feel ill, although she tries to take them. She is again reminded of her increased risk of stroke, heart disease and kidney failure due to severe uncontrolled hTn. DASH diet and commitment to daily physical activity for a minimum of 30 minutes discussed and encouraged, as a part of hypertension management. The importance of attaining a healthy weight is also discussed.  BP/Weight 08/20/2018 07/25/2018 07/23/2018 08/01/2017 06/12/2017 06/04/2017 04/10/8117  Systolic BP 147 829 562 130 865 784 696  Diastolic BP 295 85 284 132 120 112 120  Wt. (Lbs) 189 190 192 190.12 190 188 192  BMI 30.51 30.67 30.53 30.23 30.67 30.34 30.07

## 2018-08-24 ENCOUNTER — Encounter: Payer: Self-pay | Admitting: Family Medicine

## 2018-08-24 NOTE — Assessment & Plan Note (Signed)
Reports intolerance to regime for antihyprtensives

## 2018-08-24 NOTE — Assessment & Plan Note (Addendum)
Uncontrolled and severe. Encouraged pt to consitently take herbed time clonidine. She has not returned for cardiology evaluation despite referral, states repatedly that the medications make her feel ill, although she tries to take them. She is again reminded of her increased risk of stroke, heart disease and kidney failure due to severe uncontrolled hTn. DASH diet and commitment to daily physical activity for a minimum of 30 minutes discussed and encouraged, as a part of hypertension management. The importance of attaining a healthy weight is also discussed.  BP/Weight 08/20/2018 07/25/2018 07/23/2018 08/01/2017 06/12/2017 06/04/2017 [id number]  Systolic BP A999333 0000000 [id number]  Diastolic BP A999333 85 [id number] 123456 120 112 120  Wt. (Lbs) 189 190 192 190.12 190 188 192  BMI 30.51 30.67 30.53 30.23 30.67 30.34 30.07

## 2018-09-04 ENCOUNTER — Encounter: Payer: Medicare HMO | Admitting: Family Medicine

## 2018-10-28 ENCOUNTER — Other Ambulatory Visit: Payer: Self-pay

## 2018-10-28 ENCOUNTER — Ambulatory Visit (INDEPENDENT_AMBULATORY_CARE_PROVIDER_SITE_OTHER): Payer: Medicare HMO

## 2018-10-28 ENCOUNTER — Encounter (INDEPENDENT_AMBULATORY_CARE_PROVIDER_SITE_OTHER): Payer: Self-pay

## 2018-10-28 DIAGNOSIS — Z23 Encounter for immunization: Secondary | ICD-10-CM

## 2019-01-20 ENCOUNTER — Encounter (INDEPENDENT_AMBULATORY_CARE_PROVIDER_SITE_OTHER): Payer: Self-pay

## 2019-01-20 ENCOUNTER — Encounter: Payer: Self-pay | Admitting: Family Medicine

## 2019-01-20 ENCOUNTER — Other Ambulatory Visit: Payer: Self-pay

## 2019-01-20 ENCOUNTER — Ambulatory Visit (INDEPENDENT_AMBULATORY_CARE_PROVIDER_SITE_OTHER): Payer: Medicare HMO | Admitting: Family Medicine

## 2019-01-20 VITALS — BP 160/90 | Ht 66.5 in | Wt 182.0 lb

## 2019-01-20 DIAGNOSIS — Z789 Other specified health status: Secondary | ICD-10-CM

## 2019-01-20 DIAGNOSIS — E785 Hyperlipidemia, unspecified: Secondary | ICD-10-CM

## 2019-01-20 DIAGNOSIS — I10 Essential (primary) hypertension: Secondary | ICD-10-CM

## 2019-01-20 MED ORDER — AMLODIPINE BESYLATE 2.5 MG PO TABS: 2.5000 mg | ORAL_TABLET | Freq: Every day | ORAL | 3 refills | Status: DC

## 2019-01-20 NOTE — Patient Instructions (Addendum)
Follow-up in office in 2 months to reevaluate blood pressure with MD, call if you need me before. New medication for blood pressure is amlodipine 2.5 mg daily no need to resume clonidine as you do not tolerate this this is discontinued.  Please follow the Dash diet to help to lower blood pressure as well as work on smaller portions and regular exercise to lose weight this will also help blood pressure.  Please get fasting Chem-7 and EGFR and lipid panel in 1 week before your next visit thank you ([facility])  Thanks for choosing [facility], we consider it a privelige to serve you. It is important that you exercise regularly at least 30 minutes 5 times a week. If you develop chest pain, have severe difficulty breathing, or feel very tired, stop exercising immediately and seek medical attention     DASH Eating Plan DASH stands for "Dietary Approaches to Stop Hypertension." The DASH eating plan is a healthy eating plan that has been shown to reduce high blood pressure (hypertension). It may also reduce your risk for type 2 diabetes, heart disease, and stroke. The DASH eating plan may also help with weight loss. What are tips for following this plan?  General guidelines  Avoid eating more than 2,300 mg (milligrams) of salt (sodium) a day. If you have hypertension, you may need to reduce your sodium intake to 1,500 mg a day.  Limit alcohol intake to no more than 1 drink a day for nonpregnant women and 2 drinks a day for men. One drink equals 12 oz of beer, 5 oz of wine, or 1 oz of hard liquor.  Work with your health care provider to maintain a healthy body weight or to lose weight. Ask what an ideal weight is for you.  Get at least 30 minutes of exercise that causes your heart to beat faster (aerobic exercise) most days of the week. Activities may include walking, swimming, or biking.  Work with your health care provider or diet and nutrition specialist (dietitian) to adjust your  eating plan to your individual calorie needs. Reading food labels   Check food labels for the amount of sodium per serving. Choose foods with less than 5 percent of the Daily Value of sodium. Generally, foods with less than 300 mg of sodium per serving fit into this eating plan.  To find whole grains, look for the word "whole" as the first word in the ingredient list. Shopping  Buy products labeled as "low-sodium" or "no salt added."  Buy fresh foods. Avoid canned foods and premade or frozen meals. Cooking  Avoid adding salt when cooking. Use salt-free seasonings or herbs instead of table salt or sea salt. Check with your health care provider or pharmacist before using salt substitutes.  Do not fry foods. Cook foods using healthy methods such as baking, boiling, grilling, and broiling instead.  Cook with heart-healthy oils, such as olive, canola, soybean, or sunflower oil. Meal planning  Eat a balanced diet that includes: ? 5 or more servings of fruits and vegetables each day. At each meal, try to fill half of your plate with fruits and vegetables. ? Up to 6-8 servings of whole grains each day. ? Less than 6 oz of lean meat, poultry, or fish each day. A 3-oz serving of meat is about the same size as a deck of cards. One egg equals 1 oz. ? 2 servings of low-fat dairy each day. ? A serving of nuts, seeds, or beans 5 times  each week. ? Heart-healthy fats. Healthy fats called Omega-3 fatty acids are found in foods such as flaxseeds and coldwater fish, like sardines, salmon, and mackerel.  Limit how much you eat of the following: ? Canned or prepackaged foods. ? Food that is high in trans fat, such as fried foods. ? Food that is high in saturated fat, such as fatty meat. ? Sweets, desserts, sugary drinks, and other foods with added sugar. ? Full-fat dairy products.  Do not salt foods before eating.  Try to eat at least 2 vegetarian meals each week.  Eat more home-cooked food and  less restaurant, buffet, and fast food.  When eating at a restaurant, ask that your food be prepared with less salt or no salt, if possible. What foods are recommended? The items listed may not be a complete list. Talk with your dietitian about what dietary choices are best for you. Grains Whole-grain or whole-wheat bread. Whole-grain or whole-wheat pasta. Brown rice. [place]. Bulgur. Whole-grain and low-sodium cereals. Pita bread. Low-fat, low-sodium crackers. Whole-wheat flour tortillas. Vegetables Fresh or frozen vegetables (raw, steamed, roasted, or grilled). Low-sodium or reduced-sodium tomato and vegetable juice. Low-sodium or reduced-sodium tomato sauce and tomato paste. Low-sodium or reduced-sodium canned vegetables. Fruits All fresh, dried, or frozen fruit. Canned fruit in natural juice (without added sugar). Meat and other protein foods Skinless chicken or [place]. Ground chicken or [place]. Pork with fat trimmed off. Fish and seafood. Egg whites. Dried beans, peas, or lentils. Unsalted nuts, nut butters, and seeds. Unsalted canned beans. Lean cuts of beef with fat trimmed off. Low-sodium, lean deli meat. Dairy Low-fat (1%) or fat-free (skim) milk. Fat-free, low-fat, or reduced-fat cheeses. Nonfat, low-sodium ricotta or cottage cheese. Low-fat or nonfat yogurt. Low-fat, low-sodium cheese. Fats and oils Soft margarine without trans fats. Vegetable oil. Low-fat, reduced-fat, or light mayonnaise and salad dressings (reduced-sodium). Canola, safflower, olive, soybean, and sunflower oils. Avocado. Seasoning and other foods Herbs. Spices. Seasoning mixes without salt. Unsalted popcorn and pretzels. Fat-free sweets. What foods are not recommended? The items listed may not be a complete list. Talk with your dietitian about what dietary choices are best for you. Grains Baked goods made with fat, such as croissants, muffins, or some breads. Dry pasta or rice meal  packs. Vegetables Creamed or fried vegetables. Vegetables in a cheese sauce. Regular canned vegetables (not low-sodium or reduced-sodium). Regular canned tomato sauce and paste (not low-sodium or reduced-sodium). Regular tomato and vegetable juice (not low-sodium or reduced-sodium). Angie Fava. Olives. Fruits Canned fruit in a light or heavy syrup. Fried fruit. Fruit in cream or butter sauce. Meat and other protein foods Fatty cuts of meat. Ribs. Fried meat. Berniece Salines. Sausage. Bologna and other processed lunch meats. Salami. Fatback. Hotdogs. Bratwurst. Salted nuts and seeds. Canned beans with added salt. Canned or smoked fish. Whole eggs or egg yolks. Chicken or [place] with skin. Dairy Whole or 2% milk, cream, and half-and-half. Whole or full-fat cream cheese. Whole-fat or sweetened yogurt. Full-fat cheese. Nondairy creamers. Whipped toppings. Processed cheese and cheese spreads. Fats and oils Butter. Stick margarine. Lard. Shortening. Ghee. Bacon fat. Tropical oils, such as coconut, palm kernel, or palm oil. Seasoning and other foods Salted popcorn and pretzels. Onion salt, garlic salt, seasoned salt, table salt, and sea salt. Worcestershire sauce. Tartar sauce. Barbecue sauce. Teriyaki sauce. Soy sauce, including reduced-sodium. Steak sauce. Canned and packaged gravies. Fish sauce. Oyster sauce. Cocktail sauce. Horseradish that you find on the shelf. Ketchup. Mustard. Meat flavorings and tenderizers. Bouillon cubes. Hot sauce  and Tabasco sauce. Premade or packaged marinades. Premade or packaged taco seasonings. Relishes. Regular salad dressings. Where to find more information:  National Heart, Lung, and [place]: https://wilson-eaton.com/  American Heart Association: www.heart.org Summary  The DASH eating plan is a healthy eating plan that has been shown to reduce high blood pressure (hypertension). It may also reduce your risk for type 2 diabetes, heart disease, and stroke.  With the DASH eating  plan, you should limit salt (sodium) intake to 2,300 mg a day. If you have hypertension, you may need to reduce your sodium intake to 1,500 mg a day.  When on the DASH eating plan, aim to eat more fresh fruits and vegetables, whole grains, lean proteins, low-fat dairy, and heart-healthy fats.  Work with your health care provider or diet and nutrition specialist (dietitian) to adjust your eating plan to your individual calorie needs. This information is not intended to replace advice given to you by your health care provider. Make sure you discuss any questions you have with your health care provider. Document Revised: 12/01/2016 Document Reviewed: 12/13/2015 Elsevier Patient Education  2020 [employer].

## 2019-01-20 NOTE — Progress Notes (Signed)
Virtual Visit via Telephone Note  I connected with Sara Haynes on 01/20/19 at  1:40 PM EST by telephone and verified that I am speaking with the correct person using two identifiers.  Location: Patient: home Provider: office   I discussed the limitations, risks, security and privacy concerns of performing an evaluation and management service by telephone and the availability of in person appointments. I also discussed with the patient that there may be a patient responsible charge related to this service. The patient expressed understanding and agreed to proceed.   History of Present Illness: F/u uncontrolled hypertension and intolerance to all medications prescribed Stopped clonidine, taking nothing , states s/e of sedation was excessive Wants to try norvasc again Denies chest pain, palpitation, PND or orthopnea   Observations/Objective: BP (!) 160/90   Ht 5' 6.5" (1.689 m)   Wt 182 lb (82.6 kg)   BMI 28.94 kg/m  Good communication with no confusion and intact memory. Alert and oriented x 3 No signs of respiratory distress during speech     Assessment and Plan:  Malignant hypertension Uncontrolled  Trial of low dose amlodipine  DASH diet and commitment to daily physical activity for a minimum of 30 minutes discussed and encouraged, as a part of hypertension management. The importance of attaining a healthy weight is also discussed.  BP/Weight 01/20/2019 08/20/2018 07/25/2018 07/23/2018 08/01/2017 [id number]  Systolic BP 0000000 A999333 0000000 [id number]  Diastolic BP 90 A999333 85 [id number] 120 120 112  Wt. (Lbs) 182 189 190 192 190.12 190 188  BMI 28.94 30.51 30.67 30.53 30.23 30.67 30.34       Medication intolerance Recurrent problem however wants to try amlodipine which she has rejected in the past  Hyperlipidemia LDL goal <100 Hyperlipidemia:Low fat diet discussed and encouraged.   Lipid Panel  Lab Results  Component Value Date   CHOL 231 (H) 07/23/2018   HDL  56 07/23/2018   LDLCALC 155 (H) 07/23/2018   TRIG 95 07/23/2018   CHOLHDL 4.1 07/23/2018   Needs to reduce fat in diet, not at goal     Follow Up Instructions:    I discussed the assessment and treatment plan with the patient. The patient was provided an opportunity to ask questions and all were answered. The patient agreed with the plan and demonstrated an understanding of the instructions.   The patient was advised to call back or seek an in-person evaluation if the symptoms worsen or if the condition fails to improve as anticipated.  I provided 12 minutes of non-face-to-face time during this encounter.   Tula Nakayama, MD

## 2019-01-26 ENCOUNTER — Encounter: Payer: Self-pay | Admitting: Family Medicine

## 2019-01-26 NOTE — Assessment & Plan Note (Signed)
Recurrent problem however wants to try amlodipine which she has rejected in the past

## 2019-01-26 NOTE — Assessment & Plan Note (Signed)
Hyperlipidemia:Low fat diet discussed and encouraged.   Lipid Panel  Lab Results  Component Value Date   CHOL 231 (H) 07/23/2018   HDL 56 07/23/2018   LDLCALC 155 (H) 07/23/2018   TRIG 95 07/23/2018   CHOLHDL 4.1 07/23/2018   Needs to reduce fat in diet, not at goal

## 2019-01-26 NOTE — Assessment & Plan Note (Signed)
Uncontrolled  Trial of low dose amlodipine  DASH diet and commitment to daily physical activity for a minimum of 30 minutes discussed and encouraged, as a part of hypertension management. The importance of attaining a healthy weight is also discussed.  BP/Weight 01/20/2019 08/20/2018 07/25/2018 07/23/2018 08/01/2017 [id number]  Systolic BP 0000000 A999333 0000000 [id number]  Diastolic BP 90 A999333 85 [id number] 120 120 112  Wt. (Lbs) 182 189 190 192 190.12 190 188  BMI 28.94 30.51 30.67 30.53 30.23 30.67 30.34

## 2019-02-16 ENCOUNTER — Ambulatory Visit: Payer: Medicare HMO | Attending: Internal Medicine

## 2019-02-16 ENCOUNTER — Other Ambulatory Visit: Payer: Self-pay

## 2019-02-16 DIAGNOSIS — Z23 Encounter for immunization: Secondary | ICD-10-CM | POA: Insufficient documentation

## 2019-02-16 NOTE — Progress Notes (Signed)
   Covid-19 Vaccination Clinic  Name:  NICKIE WOLOSZYK    MRN: [id number] DOB: Sep 03, 1945  02/16/2019  Ms. Rease was observed post Covid-19 immunization for 15 minutes without incidence. She was provided with Vaccine Information Sheet and instruction to access the V-Safe system.   Ms. Gierke was instructed to call 911 with any severe reactions post vaccine: Marland Kitchen Difficulty breathing  . Swelling of your face and throat  . A fast heartbeat  . A bad rash all over your body  . Dizziness and weakness    Immunizations Administered    Name Date Dose VIS Date Route   Moderna COVID-19 Vaccine 02/16/2019  2:47 PM 0.5 mL 12/03/2018 Intramuscular   Manufacturer: Moderna   Lot: [id number]   [id number]

## 2019-03-16 ENCOUNTER — Ambulatory Visit: Payer: Medicare HMO | Attending: Internal Medicine

## 2019-03-16 DIAGNOSIS — Z23 Encounter for immunization: Secondary | ICD-10-CM

## 2019-03-16 NOTE — Progress Notes (Signed)
   Covid-19 Vaccination Clinic  Name:  Sara Haynes    MRN: [id number] DOB: 1945-09-18  03/16/2019  [name] was observed post Covid-19 immunization for 15 minutes without incident. She was provided with Vaccine Information Sheet and instruction to access the V-Safe system.   [name] was instructed to call 911 with any severe reactions post vaccine: Marland Kitchen Difficulty breathing  . Swelling of face and throat  . A fast heartbeat  . A bad rash all over body  . Dizziness and weakness   Immunizations Administered    Name Date Dose VIS Date Route   Moderna COVID-19 Vaccine 03/16/2019  2:19 PM 0.5 mL 12/03/2018 Intramuscular   Manufacturer: Moderna   Lot: [id number]   [id number]

## 2019-03-18 DIAGNOSIS — E785 Hyperlipidemia, unspecified: Secondary | ICD-10-CM | POA: Diagnosis not present

## 2019-03-18 DIAGNOSIS — I10 Essential (primary) hypertension: Secondary | ICD-10-CM | POA: Diagnosis not present

## 2019-03-19 LAB — BASIC METABOLIC PANEL WITH GFR
BUN/Creatinine Ratio: 15 (calc) (ref 6–22)
BUN: 17 mg/dL (ref 7–25)
CO2: 29 mmol/L (ref 20–32)
Calcium: 9.3 mg/dL (ref 8.6–10.4)
Chloride: 105 mmol/L (ref 98–110)
Creat: 1.17 mg/dL — ABNORMAL HIGH (ref 0.60–0.93)
GFR, Est African American: 53 mL/min/{1.73_m2} — ABNORMAL LOW (ref >=60)
GFR, Est Non African American: 46 mL/min/{1.73_m2} — ABNORMAL LOW (ref >=60)
Glucose, Bld: 109 mg/dL — ABNORMAL HIGH (ref 65–99)
Potassium: 4 mmol/L (ref 3.5–5.3)
Sodium: 142 mmol/L (ref 135–146)

## 2019-03-19 LAB — LIPID PANEL
Cholesterol: 220 mg/dL — ABNORMAL HIGH (ref <200)
HDL: 61 mg/dL (ref >=50)
LDL Cholesterol (Calc): 142 mg/dL (calc) — ABNORMAL HIGH
Non-HDL Cholesterol (Calc): 159 mg/dL (calc) — ABNORMAL HIGH (ref <130)
Total CHOL/HDL Ratio: 3.6 (calc) (ref <5.0)
Triglycerides: 78 mg/dL (ref <150)

## 2019-03-24 ENCOUNTER — Ambulatory Visit: Payer: Medicare HMO | Admitting: Family Medicine

## 2019-04-08 ENCOUNTER — Ambulatory Visit (INDEPENDENT_AMBULATORY_CARE_PROVIDER_SITE_OTHER): Payer: Medicare HMO | Admitting: Family Medicine

## 2019-04-08 ENCOUNTER — Encounter: Payer: Self-pay | Admitting: Family Medicine

## 2019-04-08 ENCOUNTER — Other Ambulatory Visit: Payer: Self-pay

## 2019-04-08 VITALS — BP 230/112 | HR 100 | Temp 97.1°F | Resp 15 | Ht 66.5 in | Wt 185.0 lb

## 2019-04-08 DIAGNOSIS — Z789 Other specified health status: Secondary | ICD-10-CM | POA: Diagnosis not present

## 2019-04-08 DIAGNOSIS — I10 Essential (primary) hypertension: Secondary | ICD-10-CM

## 2019-04-08 MED ORDER — LABETALOL HCL 100 MG PO TABS: 100.0000 mg | ORAL_TABLET | Freq: Every day | ORAL | 0 refills | Status: DC

## 2019-04-08 NOTE — Assessment & Plan Note (Addendum)
Sara Haynes is encouraged to maintain a well balanced diet that is low in salt. NOT Controlled, Multiple intolerances to several BP drugs. Change in meds: stopped low dose norvasc, starting labetalol 100 mg daily. Close follow up. Additionally, she is also reminded that exercise is beneficial for heart health and control of  Blood pressure. 30-60 minutes daily is recommended-walking was suggested.  Discussion on changes in kidney function and need for better control of BP. Additionally, future Cards referral might not be a bad idea.

## 2019-04-08 NOTE — Patient Instructions (Addendum)
I appreciate the opportunity to provide you with care for your health and wellness. Today we discussed: blood pressure and changes in kidneys   Follow up: 3-4 weeks  No labs or referrals today  Med Changes  START: Labetalol 100 mg in the morning.   STOP: Norvasc  Please continue to practice social distancing to keep you, your family, and our community safe.  If you must go out, please wear a mask and practice good handwashing.  It was a pleasure to see you and I look forward to continuing to work together on your health and well-being. Please do not hesitate to call the office if you need care or have questions about your care.  Have a wonderful day and week. With Gratitude, Cherly Beach, DNP, AGNP-BC

## 2019-04-08 NOTE — Assessment & Plan Note (Signed)
Recurrent issue, norvasc gave cramps all over. She is willing to try another medication. Will start labetalol 100 mg in the mornings.

## 2019-04-08 NOTE — Progress Notes (Signed)
Subjective:  Patient ID: Sara Haynes, female    DOB: [birth year]  Age: 74 y.o. MRN: [id number]  CC:  Chief Complaint  Patient presents with  . Hypertension    2 month followup       HPI  HPI  Sara Haynes is a 74 year old female patient of Dr. Moshe Cipro.  Who presents today for hypertension follow-up.  Has had multiple medication intolerances over the last several years.  Demonstrates elevated blood pressure in the office 200s over the 100 range.  At last appointment with Dr. Moshe Cipro she reintroduced low-dose Norvasc to see if she would tolerate it.  Today she presents that she cannot tolerate any gave her cramps all over. Reviewed labs with her to discuss the change in her kidney function and the cause of blood pressure on kidney function. She reports that her blood pressure readings at home are not anywhere near the readings that we get here in the office.  Though she does admit that it does go up at times.  She reports that is usually 130-140/70-80.  Does not eat salt as much, reduced amount of meat in diet. Does not exercise as much as she could. Does not drink caffeine. Reports drinking water daily. Reports sleeping well.    Today patient denies signs and symptoms of COVID 19 infection including fever, chills, cough, shortness of breath, and headache. Past Medical, Surgical, Social History, Allergies, and Medications have been Reviewed.   Past Medical History:  Diagnosis Date  . Abnormal electrocardiogram 02/03/2010   Sinus bradycardia  . Anemia   . Hyperlipidemia 11/04/2010  . Hypertension 1985   Never successfully treated.  . Medication intolerance   . Tinnitus of both ears     Current Meds  Medication Sig  . aspirin (ASPIRIN LOW DOSE) 81 MG EC tablet Take 81 mg by mouth daily.    . calcium-vitamin D (OSCAL WITH D) 500-200 MG-UNIT tablet Take 1 tablet by mouth 2 (two) times daily.  . Multiple Vitamins-Minerals (WOMENS MULTIVITAMIN PLUS) TABS Take 1 tablet  by mouth daily.     ROS:  Review of Systems  Constitutional: Negative.   HENT: Negative.   Eyes: Negative.   Respiratory: Negative.   Cardiovascular: Negative.   Gastrointestinal: Negative.   Genitourinary: Negative.   Musculoskeletal: Negative.   Skin: Negative.   Neurological: Negative.   Endo/Heme/Allergies: Negative.   Psychiatric/Behavioral: Negative.   All other systems reviewed and are negative.    Objective:   Today's Vitals: BP (!) 230/112   Pulse 100   Temp (!) 97.1 F (36.2 C) (Temporal)   Resp 15   Ht 5' 6.5" (1.689 m)   Wt 185 lb (83.9 kg)   SpO2 97%   BMI 29.41 kg/m  Vitals with BMI 04/08/2019 01/20/2019 08/20/2018  Height 5' 6.5" 5' 6.5" 5\' 6"   Weight 185 lbs 182 lbs 189 lbs  BMI 29.42 [id number] [id number]  Systolic 123456 0000000 A999333  Diastolic XX123456 90 A999333  Pulse 100 - 108     Physical Exam Vitals and nursing note reviewed.  Constitutional:      Appearance: Normal appearance. She is well-developed and well-groomed. She is obese.  HENT:     Head: Normocephalic and atraumatic.     Right Ear: External ear normal.     Left Ear: External ear normal.     Mouth/Throat:     Comments: Mask In place  Eyes:     General:        Right  eye: No discharge.        Left eye: No discharge.     Conjunctiva/sclera: Conjunctivae normal.  Cardiovascular:     Rate and Rhythm: Regular rhythm. Tachycardia present.     Pulses: Normal pulses.     Heart sounds: Normal heart sounds.  Pulmonary:     Effort: Pulmonary effort is normal.     Breath sounds: Normal breath sounds.  Musculoskeletal:        General: Normal range of motion.     Cervical back: Normal range of motion and neck supple.  Skin:    General: Skin is warm.  Neurological:     General: No focal deficit present.     Mental Status: She is alert and oriented to person, place, and time.  Psychiatric:        Attention and Perception: Attention normal.        Mood and Affect: Mood normal.        Speech: Speech normal.         Behavior: Behavior normal. Behavior is cooperative.        Thought Content: Thought content normal.        Cognition and Memory: Cognition normal.        Judgment: Judgment normal.        Assessment   1. Malignant hypertension   2. Medication intolerance     Tests ordered No orders of the defined types were placed in this encounter.    Plan: Please see assessment and plan per problem list above.   Meds ordered this encounter  Medications  . labetalol (NORMODYNE) 100 MG tablet    Sig: Take 1 tablet (100 mg total) by mouth daily after breakfast.    Dispense:  30 tablet    Refill:  0    Order Specific Question:   Supervising Provider    Answer:   Fayrene Helper [id number]    Patient to follow-up in 3 to 4 weeks.  Perlie Mayo, NP

## 2019-05-02 ENCOUNTER — Ambulatory Visit (INDEPENDENT_AMBULATORY_CARE_PROVIDER_SITE_OTHER): Payer: Medicare HMO | Admitting: Family Medicine

## 2019-05-02 ENCOUNTER — Other Ambulatory Visit: Payer: Self-pay

## 2019-05-02 ENCOUNTER — Encounter: Payer: Self-pay | Admitting: Family Medicine

## 2019-05-02 DIAGNOSIS — I10 Essential (primary) hypertension: Secondary | ICD-10-CM

## 2019-05-02 MED ORDER — LABETALOL HCL 100 MG PO TABS: 100.0000 mg | ORAL_TABLET | Freq: Every day | ORAL | 0 refills | Status: DC

## 2019-05-02 NOTE — Assessment & Plan Note (Signed)
Started Sara Haynes on labetalol 100 mg daily.  She reports that she tolerates this medication much better than she is tolerating any other medication which is great.  However her blood pressure still elevated today in the office.  She reports today with her cuff for helping with correlation which does correlate with a blood pressure check today in the office both by machine and manually.  But her machine also gives readings of controlled blood pressures which she has been checking at home.  Possible whitecoat syndrome could be an aspect of her hypertension.  However her demonstrated control of blood pressure at home is not well developed and still needs to have blood pressure medicine given daily.  I have advised for her to take the medication and instructed her on how to check her blood pressures and we will follow-up in 8 weeks to see where she is at at that time.  If needed we will add a second dose of labetalol unless she demonstrates more control. She is agreement and understanding of this and is willing to continue with this medication.

## 2019-05-02 NOTE — Patient Instructions (Addendum)
I appreciate the opportunity to provide you with care for your health and wellness. Today we discussed:  Blood pressure  Follow up: 8 weeks in office for BP  No labs or referrals today  Continue to take the Labetalol 100 mg as directed   Blood Pressure Checks at home: In Morning, before medication In Afternoon  Before Bed  Your numbers are looking better overall, I hope they stay that way.   Please continue to practice social distancing to keep you, your family, and our community safe.  If you must go out, please wear a mask and practice good handwashing.  It was a pleasure to see you and I look forward to continuing to work together on your health and well-being. Please do not hesitate to call the office if you need care or have questions about your care.  Have a wonderful day and weekend. With Gratitude, Cherly Beach, DNP, AGNP-BC

## 2019-05-02 NOTE — Progress Notes (Signed)
Subjective:  Patient ID: Sara Haynes, female    DOB: [birth year]  Age: 74 y.o. MRN: [id number]  CC:  Chief Complaint  Patient presents with  . Follow-up    3-4 week follow up bp is still high when she takes her bp pill if she does not take this it goes down      HPI  HPI  Started Ms. Bota on labetalol 100 mg daily at last visit on April 6.  She reports that she tolerates this medication much better than she is tolerating any other medication which is great.  However, her blood pressure still elevated today in the office.  She reports today with her cuff for helping with correlation which does correlate with a blood pressure check today in the office both by machine and manually.  But her machine also gives readings of controlled blood pressures which she has been checking at home.  Possible whitecoat syndrome could be an aspect of her hypertension.  However, her demonstrated control of blood pressure at home is not well-developed and still needs to have blood pressure medicine given daily.  She is willing to continue the blood pressure medicine and to follow-up in 8 weeks.  Today patient denies signs and symptoms of COVID 19 infection including fever, chills, cough, shortness of breath, and headache. Past Medical, Surgical, Social History, Allergies, and Medications have been Reviewed.   Past Medical History:  Diagnosis Date  . Abnormal electrocardiogram 02/03/2010   Sinus bradycardia  . Anemia   . Hyperlipidemia 11/04/2010  . Hypertension 1985   Never successfully treated.  . Medication intolerance   . Tinnitus of both ears     Current Meds  Medication Sig  . aspirin (ASPIRIN LOW DOSE) 81 MG EC tablet Take 81 mg by mouth daily.    . calcium-vitamin D (OSCAL WITH D) 500-200 MG-UNIT tablet Take 1 tablet by mouth 2 (two) times daily.  Marland Kitchen labetalol (NORMODYNE) 100 MG tablet Take 1 tablet (100 mg total) by mouth daily after breakfast.  . Multiple Vitamins-Minerals  (WOMENS MULTIVITAMIN PLUS) TABS Take 1 tablet by mouth daily.   . [DISCONTINUED] labetalol (NORMODYNE) 100 MG tablet Take 1 tablet (100 mg total) by mouth daily after breakfast.    ROS:  Review of Systems  Constitutional: Negative.   HENT: Negative.   Eyes: Negative.   Respiratory: Negative.   Cardiovascular: Negative.   Gastrointestinal: Negative.   Genitourinary: Negative.   Musculoskeletal: Negative.   Skin: Negative.   Neurological: Negative.   Endo/Heme/Allergies: Negative.   Psychiatric/Behavioral: Negative.   All other systems reviewed and are negative.    Objective:   Today's Vitals: BP (!) 198/116 (BP Location: Left Arm, Patient Position: Sitting, Cuff Size: Normal)   Pulse 94   Temp (!) 97.4 F (36.3 C) (Temporal)   Wt 184 lb (83.5 kg)   SpO2 97%   BMI 29.25 kg/m  Vitals with BMI 05/02/2019 04/08/2019 01/20/2019  Height - 5' 6.5" 5' 6.5"  Weight 184 lbs 185 lbs 182 lbs  BMI 29.26 XX123456 A999333  Systolic 99991111 123456 0000000  Diastolic 99991111 XX123456 90  Pulse 94 100 -     Physical Exam Vitals and nursing note reviewed.  Constitutional:      Appearance: Normal appearance. She is well-developed, well-groomed and overweight.  HENT:     Head: Normocephalic and atraumatic.     Right Ear: External ear normal.     Left Ear: External ear normal.  Mouth/Throat:     Comments: Mask in place Eyes:     General:        Right eye: No discharge.        Left eye: No discharge.     Conjunctiva/sclera: Conjunctivae normal.  Cardiovascular:     Rate and Rhythm: Normal rate and regular rhythm.     Pulses: Normal pulses.     Heart sounds: Normal heart sounds.  Pulmonary:     Effort: Pulmonary effort is normal.     Breath sounds: Normal breath sounds.  Musculoskeletal:        General: Normal range of motion.     Cervical back: Normal range of motion and neck supple.  Skin:    General: Skin is warm.  Neurological:     General: No focal deficit present.     Mental Status: She is  alert and oriented to person, place, and time.  Psychiatric:        Attention and Perception: Attention normal.        Mood and Affect: Mood normal.        Speech: Speech normal.        Behavior: Behavior normal. Behavior is cooperative.        Thought Content: Thought content normal.        Cognition and Memory: Cognition normal.        Judgment: Judgment normal.      Assessment   1. Malignant hypertension     Tests ordered No orders of the defined types were placed in this encounter.    Plan: Please see assessment and plan per problem list above.   Meds ordered this encounter  Medications  . labetalol (NORMODYNE) 100 MG tablet    Sig: Take 1 tablet (100 mg total) by mouth daily after breakfast.    Dispense:  60 tablet    Refill:  0    Order Specific Question:   Supervising Provider    Answer:   Fayrene Helper [id number]    Patient to follow-up in 8 weeks.  Perlie Mayo, NP

## 2019-07-02 ENCOUNTER — Ambulatory Visit: Payer: Medicare HMO | Admitting: Family Medicine

## 2019-07-09 ENCOUNTER — Other Ambulatory Visit: Payer: Self-pay

## 2019-07-09 ENCOUNTER — Encounter: Payer: Self-pay | Admitting: Family Medicine

## 2019-07-09 ENCOUNTER — Ambulatory Visit (INDEPENDENT_AMBULATORY_CARE_PROVIDER_SITE_OTHER): Payer: Medicare HMO | Admitting: Family Medicine

## 2019-07-09 VITALS — BP 201/120 | HR 100 | Resp 16 | Ht 66.5 in | Wt 179.0 lb

## 2019-07-09 DIAGNOSIS — E559 Vitamin D deficiency, unspecified: Secondary | ICD-10-CM | POA: Diagnosis not present

## 2019-07-09 DIAGNOSIS — I10 Essential (primary) hypertension: Secondary | ICD-10-CM | POA: Diagnosis not present

## 2019-07-09 DIAGNOSIS — L03115 Cellulitis of right lower limb: Secondary | ICD-10-CM | POA: Diagnosis not present

## 2019-07-09 DIAGNOSIS — E8881 Metabolic syndrome: Secondary | ICD-10-CM

## 2019-07-09 DIAGNOSIS — E785 Hyperlipidemia, unspecified: Secondary | ICD-10-CM | POA: Diagnosis not present

## 2019-07-09 MED ORDER — TRIAMTERENE-HCTZ 37.5-25 MG PO TABS
ORAL_TABLET | ORAL | 3 refills | Status: DC
Start: 2019-07-09 — End: 2019-11-06

## 2019-07-09 MED ORDER — CEPHALEXIN 500 MG PO CAPS: 500.0000 mg | ORAL_CAPSULE | Freq: Three times a day (TID) | ORAL | 0 refills | Status: DC

## 2019-07-09 NOTE — Patient Instructions (Addendum)
Annual physical exam with MD in early September call if you need me sooner.  Please get fasting CBC lipid CMP and EGFR TSH and vitamin D next week.  This is due.  Keflex is prescribed for cellulitis of the right foot.  You are also referred to podiatry.  If the foot has completely recovered and is back to normal by the time of your podiatry appointment there is no need to keep the appointment and you may  cancel it.  For uncontrolled high blood pressure start Maxide 25 mg half a tablet at 8 AM second half at 8 PM.  You are referred to cardiology for management of blood pressure and evaluation of your heart it is extremely important that you keep this appointment.   Please elevate the right foot and stay off of it for the next 5 days so the swelling to go down and healing to take place thank

## 2019-07-09 NOTE — Progress Notes (Signed)
   Sara Haynes     MRN: [id number]      DOB: [birth year]   HPI Sara Haynes is here with a 10-day history of right foot pain redness and swelling with no known trauma.  She denies generalized fever or chills.  She is able to weight-bear.No known inciting trauma  Patient also here for follow-up of her uncontrolled hypertension.  Of significance she does have a family member with her her younger sister who will help to follow through with getting the appropriate care facility for malignant hypertension.  Patient does agree to see cardiology for assistance again at this visit  ROS Denies recent fever or chills. Denies sinus pressure, nasal congestion, ear pain or sore throat. Denies chest congestion, productive cough or wheezing. Denies chest pains, palpitations and leg swelling Denies abdominal pain, nausea, vomiting,diarrhea or constipation.   Denies dysuria, frequency, hesitancy or incontinence.  Denies headaches, seizures, numbness, or tingling. Denies depression, anxiety or insomnia.  PE  BP (!) 201/120   Pulse 100   Resp 16   Ht 5' 6.5" (1.689 m)   Wt 179 lb (81.2 kg)   SpO2 98%   BMI 28.46 kg/m   Patient alert and oriented and in no cardiopulmonary distress.  HEENT: No facial asymmetry, EOMI,     Neck supple .  Chest: Clear to auscultation bilaterally.  CVS: S1, S2 no murmurs, no S3.Regular rate.  ABD: Soft non tender.   Ext: right pedal edema  MS: Adequate ROM spine, shoulders, hips and knees.  Skin: Intact, erythema and warmth of dorsum of right foot  Psych: Good eye contact, normal affect. Memory intact not anxious or depressed appearing.  CNS: CN 2-12 intact, power,  normal throughout.no focal deficits noted.   Assessment & Plan  Cellulitis of right foot Keflex is prescribed. Pt to elevate foot and stay off foot for next 5 days. No evidence of punctured skin, no drainage. Podiatry to follow up if not improved in next 1 week, referral  enetered  Malignant hypertension Remains uncontrolled, and remarkably no obvious endo organ damage and  Asymptomatic. With sibling present pt agrees to have cardiology re evaluae and manage. She will start Maxzide 25 , half tablet twicedaily DASH diet and commitment to daily physical activity for a minimum of 30 minutes discussed and encouraged, as a part of hypertension management. The importance of attaining a healthy weight is also discussed.  BP/Weight 07/09/2019 05/02/2019 04/08/2019 01/20/2019 08/20/2018 07/25/2018 02/22/2540  Systolic BP 706 237 628 315 176 160 737  Diastolic BP 106 269 485 90 110 85 108  Wt. (Lbs) 179 184 185 182 189 190 192  BMI 28.46 29.25 29.41 28.94 30.51 30.67 30.53       Hyperlipidemia LDL goal <100 Hyperlipidemia:Low fat diet discussed and encouraged.   Lipid Panel  Lab Results  Component Value Date   CHOL 220 (H) 03/18/2019   HDL 61 03/18/2019   LDLCALC 142 (H) 03/18/2019   TRIG 78 03/18/2019   CHOLHDL 3.6 03/18/2019   Needs to reduce fried and fatty foods

## 2019-07-12 ENCOUNTER — Encounter: Payer: Self-pay | Admitting: Family Medicine

## 2019-07-12 NOTE — Assessment & Plan Note (Signed)
Keflex is prescribed. Pt to elevate foot and stay off foot for next 5 days. No evidence of punctured skin, no drainage. Podiatry to follow up if not improved in next 1 week, referral enetered

## 2019-07-12 NOTE — Assessment & Plan Note (Signed)
Hyperlipidemia:Low fat diet discussed and encouraged.   Lipid Panel  Lab Results  Component Value Date   CHOL 220 (H) 03/18/2019   HDL 61 03/18/2019   LDLCALC 142 (H) 03/18/2019   TRIG 78 03/18/2019   CHOLHDL 3.6 03/18/2019   Needs to reduce fried and fatty foods

## 2019-07-12 NOTE — Assessment & Plan Note (Signed)
Remains uncontrolled, and remarkably no obvious endo organ damage and  Asymptomatic. With sibling present pt agrees to have cardiology re evaluae and manage. She will start Maxzide 25 , half tablet twicedaily DASH diet and commitment to daily physical activity for a minimum of 30 minutes discussed and encouraged, as a part of hypertension management. The importance of attaining a healthy weight is also discussed.  BP/Weight 07/09/2019 05/02/2019 04/08/2019 01/20/2019 08/20/2018 07/25/2018 8/58/8502  Systolic BP 774 128 786 767 209 470 962  Diastolic BP 836 629 476 90 110 85 108  Wt. (Lbs) 179 184 185 182 189 190 192  BMI 28.46 29.25 29.41 28.94 30.51 30.67 30.53

## 2019-07-18 DIAGNOSIS — E559 Vitamin D deficiency, unspecified: Secondary | ICD-10-CM | POA: Diagnosis not present

## 2019-07-18 DIAGNOSIS — E8881 Metabolic syndrome: Secondary | ICD-10-CM | POA: Diagnosis not present

## 2019-07-18 DIAGNOSIS — I10 Essential (primary) hypertension: Secondary | ICD-10-CM | POA: Diagnosis not present

## 2019-07-18 DIAGNOSIS — E785 Hyperlipidemia, unspecified: Secondary | ICD-10-CM | POA: Diagnosis not present

## 2019-07-19 LAB — COMPLETE METABOLIC PANEL WITH GFR
AG Ratio: 1.2 (calc) (ref 1.0–2.5)
ALT: 18 U/L (ref 6–29)
AST: 18 U/L (ref 10–35)
Albumin: 4.5 g/dL (ref 3.6–5.1)
Alkaline phosphatase (APISO): 71 U/L (ref 37–153)
BUN/Creatinine Ratio: 15 (calc) (ref 6–22)
BUN: 22 mg/dL (ref 7–25)
CO2: 27 mmol/L (ref 20–32)
Calcium: 10 mg/dL (ref 8.6–10.4)
Chloride: 103 mmol/L (ref 98–110)
Creat: 1.45 mg/dL — ABNORMAL HIGH (ref 0.60–0.93)
GFR, Est African American: 41 mL/min/{1.73_m2} — ABNORMAL LOW (ref >=60)
GFR, Est Non African American: 35 mL/min/{1.73_m2} — ABNORMAL LOW (ref >=60)
Globulin: 3.7 g/dL (calc) (ref 1.9–3.7)
Glucose, Bld: 93 mg/dL (ref 65–99)
Potassium: 5 mmol/L (ref 3.5–5.3)
Sodium: 139 mmol/L (ref 135–146)
Total Bilirubin: 0.7 mg/dL (ref 0.2–1.2)
Total Protein: 8.2 g/dL — ABNORMAL HIGH (ref 6.1–8.1)

## 2019-07-19 LAB — LIPID PANEL
Cholesterol: 207 mg/dL — ABNORMAL HIGH (ref <200)
HDL: 53 mg/dL (ref >=50)
LDL Cholesterol (Calc): 124 mg/dL (calc) — ABNORMAL HIGH
Non-HDL Cholesterol (Calc): 154 mg/dL (calc) — ABNORMAL HIGH (ref <130)
Total CHOL/HDL Ratio: 3.9 (calc) (ref <5.0)
Triglycerides: 189 mg/dL — ABNORMAL HIGH (ref <150)

## 2019-07-19 LAB — CBC
HCT: 35.2 % (ref 35.0–45.0)
Hemoglobin: 11.3 g/dL — ABNORMAL LOW (ref 11.7–15.5)
MCH: 30 pg (ref 27.0–33.0)
MCHC: 32.1 g/dL (ref 32.0–36.0)
MCV: 93.4 fL (ref 80.0–100.0)
MPV: 11.9 fL (ref 7.5–12.5)
Platelets: 179 10*3/uL (ref 140–400)
RBC: 3.77 10*6/uL — ABNORMAL LOW (ref 3.80–5.10)
RDW: 12.8 % (ref 11.0–15.0)
WBC: 4.1 10*3/uL (ref 3.8–10.8)

## 2019-07-19 LAB — VITAMIN D 25 HYDROXY (VIT D DEFICIENCY, FRACTURES): Vit D, 25-Hydroxy: 20 ng/mL — ABNORMAL LOW (ref 30–100)

## 2019-07-19 LAB — TSH: TSH: 1.93 mIU/L (ref 0.40–4.50)

## 2019-07-28 ENCOUNTER — Other Ambulatory Visit: Payer: Self-pay

## 2019-07-29 ENCOUNTER — Encounter: Payer: Medicare HMO | Admitting: Family Medicine

## 2019-07-29 NOTE — Progress Notes (Signed)
This encounter was created in error - please disregard.

## 2019-07-30 ENCOUNTER — Encounter: Payer: Self-pay | Admitting: Family Medicine

## 2019-09-29 ENCOUNTER — Encounter: Payer: Medicare HMO | Admitting: Family Medicine

## 2019-10-14 ENCOUNTER — Ambulatory Visit (INDEPENDENT_AMBULATORY_CARE_PROVIDER_SITE_OTHER): Payer: Medicare HMO | Admitting: Cardiology

## 2019-10-14 ENCOUNTER — Encounter: Payer: Self-pay | Admitting: Cardiology

## 2019-10-14 VITALS — BP 168/98 | HR 121 | Ht 66.5 in | Wt 172.0 lb

## 2019-10-14 DIAGNOSIS — I10 Essential (primary) hypertension: Secondary | ICD-10-CM

## 2019-10-14 DIAGNOSIS — R011 Cardiac murmur, unspecified: Secondary | ICD-10-CM | POA: Diagnosis not present

## 2019-10-14 NOTE — Patient Instructions (Signed)
Your physician recommends that you schedule a follow-up appointment in: [place], [place]  Your physician recommends that you continue on your current medications as directed. Please refer to the Current Medication list given to you today.  Your physician has requested that you have an echocardiogram. Echocardiography is a painless test that uses sound waves to create images of your heart. It provides your doctor with information about the size and shape of your heart and how well your heart's chambers and valves are working. This procedure takes approximately one hour. There are no restrictions for this procedure.  PLEASE BRING HOME BLOOD PRESSURE READINGS AND BLOOD PRESSURE CUFF TO YOUR ECHO APPOINTMENT   Thank you for choosing [facility]!!

## 2019-10-14 NOTE — Progress Notes (Signed)
Clinical Summary [name] is a 74 y.o.female last seen by Dr Bronson Ing in 02/2016, seen as new patient today  1. HTN - from prior notes history of malignant HTN - adverse effects to norvasc, hydralazine, HCTZ, lisinopril, aldactone.   Normal renal artery duplex 03/06/11   - home bp's daily are 110s-140s/60, HRs 70.  - has compared her home cuff with pcp's, reports accuracy.  - reports no longer taking labetalol, made her bp go too high.      Past Medical History:  Diagnosis Date  . Abnormal electrocardiogram 02/03/2010   Sinus bradycardia  . Anemia   . Hyperlipidemia 11/04/2010  . Hypertension 1985   Never successfully treated.  . Medication intolerance   . Tinnitus of both ears      Allergies  Allergen Reactions  . Chlorthalidone Other (See Comments)    States medication is drunk and dizzy  . Amlodipine     All over cramps  . Hydralazine Other (See Comments)  . Hydrochlorothiazide W-Triamterene     REACTION: cramps  . Lisinopril     Dizzy and leg cramps   . Spironolactone Other (See Comments)    muscle cramps     Current Outpatient Medications  Medication Sig Dispense Refill  . aspirin (ASPIRIN LOW DOSE) 81 MG EC tablet Take 81 mg by mouth daily.      . calcium-vitamin D (OSCAL WITH D) 500-200 MG-UNIT tablet Take 1 tablet by mouth 2 (two) times daily. 60 tablet 11  . cephALEXin (KEFLEX) 500 MG capsule Take 1 capsule (500 mg total) by mouth 3 (three) times daily. 30 capsule 0  . labetalol (NORMODYNE) 100 MG tablet Take 1 tablet (100 mg total) by mouth daily after breakfast. 60 tablet 0  . Multiple Vitamins-Minerals (WOMENS MULTIVITAMIN PLUS) TABS Take 1 tablet by mouth daily.     Marland Kitchen triamterene-hydrochlorothiazide (MAXZIDE-25) 37.5-25 MG tablet Take half tablet by mouth two times daily, 8 am and 8 pm for blood prerssure 30 tablet 3   No current facility-administered medications for this visit.     Past Surgical History:  Procedure Laterality Date  .  ARM SKIN LESION BIOPSY / EXCISION Right    fatty tumor  . CATARACT EXTRACTION  2011   Bilateral  . COLONOSCOPY    . COLONOSCOPY  09/20/2011   Procedure: COLONOSCOPY;  Surgeon: Rogene Houston, MD;  Location: [facility];  Service: Endoscopy;  Laterality: N/A;  930  . EYE SURGERY Right 11/2010   cataract extraction  . EYE SURGERY Left 01/2011   cataract extraction  . HYSTEROSCOPY WITH D & C N/A 05/13/2013   Procedure: DILATATION AND CURETTAGE /HYSTEROSCOPY;  Surgeon: Jonnie Kind, MD;  Location: [facility];  Service: Gynecology;  Laterality: N/A;  . POLYPECTOMY N/A 05/13/2013   Procedure: POLYPECTOMY (removal endometrial polyp);  Surgeon: Jonnie Kind, MD;  Location: [facility];  Service: Gynecology;  Laterality: N/A;  . TUBAL LIGATION  1977     Allergies  Allergen Reactions  . Chlorthalidone Other (See Comments)    States medication is drunk and dizzy  . Amlodipine     All over cramps  . Hydralazine Other (See Comments)  . Hydrochlorothiazide W-Triamterene     REACTION: cramps  . Lisinopril     Dizzy and leg cramps   . Spironolactone Other (See Comments)    muscle cramps      Family History  Problem Relation Age of Onset  . COPD Mother   .  Diabetes Mother   . Pneumonia Father   . Alcohol abuse Father   . Diabetes Brother   . Hypertension Brother   . Hypertension Sister   . Hypertension Brother   . Hypertension Sister   . Hypertension Sister   . Hypertension Brother      Social History [name] reports that she has never smoked. She has never used smokeless tobacco. [name] reports no history of alcohol use.   Review of Systems CONSTITUTIONAL: No weight loss, fever, chills, weakness or fatigue.  HEENT: Eyes: No visual loss, blurred vision, double vision or yellow sclerae.No hearing loss, sneezing, congestion, runny nose or sore throat.  SKIN: No rash or itching.  CARDIOVASCULAR: per hpi RESPIRATORY: No shortness of breath, cough or sputum.    GASTROINTESTINAL: No anorexia, nausea, vomiting or diarrhea. No abdominal pain or blood.  GENITOURINARY: No burning on urination, no polyuria NEUROLOGICAL: No headache, dizziness, syncope, paralysis, ataxia, numbness or tingling in the extremities. No change in bowel or bladder control.  MUSCULOSKELETAL: No muscle, back pain, joint pain or stiffness.  LYMPHATICS: No enlarged nodes. No history of splenectomy.  PSYCHIATRIC: No history of depression or anxiety.  ENDOCRINOLOGIC: No reports of sweating, cold or heat intolerance. No polyuria or polydipsia.  Marland Kitchen   Physical Examination Today's Vitals   10/14/19 1315  BP: (!) 168/98  Pulse: (!) 121  SpO2: 98%  Weight: 172 lb (78 kg)  Height: 5' 6.5" (1.689 m)   Body mass index is 27.35 kg/m.  Gen: resting comfortably, no acute distress HEENT: no scleral icterus, pupils equal round and reactive, no palptable cervical adenopathy,  CV: Regular, tachy, 3/6 systolic murmur rusb, no jvd Resp: Clear to auscultation bilaterally GI: abdomen is soft, non-tender, non-distended, normal bowel sounds, no hepatosplenomegaly MSK: extremities are warm, no edema.  Skin: warm, no rash Neuro:  no focal deficits Psych: appropriate affect     Assessment and Plan  1. HTN  - she reports relatively normal home bp's, reports heart rated and bp'd increase at doctors visits - medical therapy complicated by multiple listed allergies to bp meds, all forms of dizziness, muscle cramps - unclear if truly elevated bp's or primarily white coat HTN. She will bring her home bp log and cuff in with her to compare to ours - for now continue maxide, would retry labetalol if another agent needed. Must be very careful in classifying aditional allergies in the future as she is running out of options  2. Heart murmur  obtain echo  EKG today show sinus tach 120    Arnoldo Lenis, M.D

## 2019-10-28 ENCOUNTER — Ambulatory Visit (INDEPENDENT_AMBULATORY_CARE_PROVIDER_SITE_OTHER): Payer: Medicare HMO

## 2019-10-28 DIAGNOSIS — R011 Cardiac murmur, unspecified: Secondary | ICD-10-CM

## 2019-10-28 LAB — ECHOCARDIOGRAM COMPLETE
Area-P 1/2: 3.63 cm2
Calc EF: 62.2 %
MV M vel: 5.14 m/s
MV Peak grad: 105.5 mmHg
P 1/2 time: 360 msec
S' Lateral: 2.88 cm
Single Plane A2C EF: 65.3 %
Single Plane A4C EF: 63.6 %

## 2019-11-06 ENCOUNTER — Other Ambulatory Visit: Payer: Self-pay

## 2019-11-06 ENCOUNTER — Encounter: Payer: Medicare HMO | Admitting: Family Medicine

## 2019-11-06 ENCOUNTER — Telehealth: Payer: Self-pay | Admitting: *Deleted

## 2019-11-06 DIAGNOSIS — Z888 Allergy status to other drugs, medicaments and biological substances status: Secondary | ICD-10-CM | POA: Diagnosis not present

## 2019-11-06 DIAGNOSIS — Z803 Family history of malignant neoplasm of breast: Secondary | ICD-10-CM | POA: Diagnosis not present

## 2019-11-06 DIAGNOSIS — Z825 Family history of asthma and other chronic lower respiratory diseases: Secondary | ICD-10-CM | POA: Diagnosis not present

## 2019-11-06 DIAGNOSIS — I10 Essential (primary) hypertension: Secondary | ICD-10-CM | POA: Diagnosis not present

## 2019-11-06 DIAGNOSIS — Z8249 Family history of ischemic heart disease and other diseases of the circulatory system: Secondary | ICD-10-CM | POA: Diagnosis not present

## 2019-11-06 MED ORDER — TRIAMTERENE-HCTZ 37.5-25 MG PO TABS: ORAL_TABLET | ORAL | 1 refills | Status: DC

## 2019-11-06 NOTE — Progress Notes (Deleted)
Subjective:   Sara Haynes is a 74 y.o. female who presents for Medicare Annual (Subsequent) preventive examination.  Review of Systems    ***       Objective:    There were no vitals filed for this visit. There is no height or weight on file to calculate BMI.  Advanced Directives 07/25/2018 04/16/2017 07/27/2016 01/13/2016 05/13/2013 05/07/2013 09/20/2011  Does Patient Have a Medical Advance Directive? No No No No Patient does not have advance directive;Patient would not like information Patient does not have advance directive;Patient would not like information Patient does not have advance directive;Patient would not like information  Would patient like information on creating a medical advance directive? No - Patient declined Yes (MAU/Ambulatory/Procedural Areas - Information given) - Yes (MAU/Ambulatory/Procedural Areas - Information given) - - -  Pre-existing out of facility DNR order (yellow form or pink MOST form) - - - - - No -    Current Medications (verified) Outpatient Encounter Medications as of 11/06/2019  Medication Sig  . aspirin (ASPIRIN LOW DOSE) 81 MG EC tablet Take 81 mg by mouth daily.    . calcium-vitamin D (OSCAL WITH D) 500-200 MG-UNIT tablet Take 1 tablet by mouth 2 (two) times daily.  . Multiple Vitamins-Minerals (WOMENS MULTIVITAMIN PLUS) TABS Take 1 tablet by mouth daily.   Marland Kitchen triamterene-hydrochlorothiazide (MAXZIDE-25) 37.5-25 MG tablet Take half tablet by mouth two times daily, 8 am and 8 pm for blood prerssure   No facility-administered encounter medications on file as of 11/06/2019.    Allergies (verified) Chlorthalidone, Amlodipine, Hydralazine, Hydrochlorothiazide w-triamterene, Lisinopril, and Spironolactone   History: Past Medical History:  Diagnosis Date  . Abnormal electrocardiogram 02/03/2010   Sinus bradycardia  . Anemia   . Hyperlipidemia 11/04/2010  . Hypertension 1985   Never successfully treated.  . Medication intolerance   .  Tinnitus of both ears    Past Surgical History:  Procedure Laterality Date  . ARM SKIN LESION BIOPSY / EXCISION Right    fatty tumor  . CATARACT EXTRACTION  2011   Bilateral  . COLONOSCOPY    . COLONOSCOPY  09/20/2011   Procedure: COLONOSCOPY;  Surgeon: Rogene Houston, MD;  [facility];  Service: Endoscopy;  Laterality: N/A;  930  . EYE SURGERY Right 11/2010   cataract extraction  . EYE SURGERY Left 01/2011   cataract extraction  . HYSTEROSCOPY WITH D & C N/A 05/13/2013   Procedure: DILATATION AND CURETTAGE /HYSTEROSCOPY;  Surgeon: Jonnie Kind, MD;  Location: [facility];  Service: Gynecology;  Laterality: N/A;  . POLYPECTOMY N/A 05/13/2013   Procedure: POLYPECTOMY (removal endometrial polyp);  Surgeon: Jonnie Kind, MD;  Location: [facility];  Service: Gynecology;  Laterality: N/A;  . TUBAL LIGATION  1977   Family History  Problem Relation Age of Onset  . COPD Mother   . Diabetes Mother   . Pneumonia Father   . Alcohol abuse Father   . Diabetes Brother   . Hypertension Brother   . Hypertension Sister   . Hypertension Brother   . Hypertension Sister   . Hypertension Sister   . Hypertension Brother    Social History   Socioeconomic History  . Marital status: Married    Spouse name: Not on file  . Number of children: 3  . Years of education: Not on file  . Highest education level: Not on file  Occupational History  . Occupation: Retired  Tobacco Use  . Smoking status: Never Smoker  .  Smokeless tobacco: Never Used  Substance and Sexual Activity  . Alcohol use: No  . Drug use: No  . Sexual activity: Never    Birth control/protection: Post-menopausal  Other Topics Concern  . Not on file  Social History Narrative  . Not on file   Social Determinants of Health   Financial Resource Strain:   . Difficulty of Paying Living Expenses: Not on file  Food Insecurity:   . Worried About Charity fundraiser in the Last Year: Not on file  . Ran Out of Food in the  Last Year: Not on file  Transportation Needs:   . Lack of Transportation (Medical): Not on file  . Lack of Transportation (Non-Medical): Not on file  Physical Activity:   . Days of Exercise per Week: Not on file  . Minutes of Exercise per Session: Not on file  Stress:   . Feeling of Stress : Not on file  Social Connections:   . Frequency of Communication with Friends and Family: Not on file  . Frequency of Social Gatherings with Friends and Family: Not on file  . Attends Religious Services: Not on file  . Active Member of Clubs or Organizations: Not on file  . Attends Archivist Meetings: Not on file  . Marital Status: Not on file    Tobacco Counseling Counseling given: Not Answered   Clinical Intake:                 Diabetic?***         Activities of Daily Living No flowsheet data found.  Patient Care Team: Fayrene Helper, MD as PCP - General Branch, Alphonse Guild, MD as PCP - Cardiology (Cardiology)  Indicate any recent Medical Services you may have received from other than [facility] providers in the past year (date may be approximate).     Assessment:   This is a routine wellness examination for [name].  Hearing/Vision screen No exam data present  Dietary issues and exercise activities discussed:    Goals    .  Exercise 3x per week (30 min per time) (pt-stated)      I would like to start exercising 3 times a week 45 minutes at a time, starting today 01/13/2016.      Depression Screen PHQ 2/9 Scores 07/09/2019 05/02/2019 08/20/2018 07/25/2018 07/23/2018 08/01/2017 04/16/2017  PHQ - 2 Score 0 0 0 0 0 0 0  PHQ- 9 Score - 0 - - - - -    Fall Risk Fall Risk  07/09/2019 05/02/2019 04/08/2019 01/20/2019 08/20/2018  Falls in the past year? 1 0 0 0 0  Number falls in past yr: 1 0 0 0 0  Injury with Fall? 0 0 0 0 0  Risk for fall due to : - History of fall(s) - - -  Follow up - Falls evaluation completed - - -    Any stairs in or around the home?  {YES/NO:21197} If so, are there any without handrails? {YES/NO:21197} Home free of loose throw rugs in walkways, pet beds, electrical cords, etc? {YES/NO:21197} Adequate lighting in your home to reduce risk of falls? {YES/NO:21197}  ASSISTIVE DEVICES UTILIZED TO PREVENT FALLS:  Life alert? {YES/NO:21197} Use of a cane, walker or w/c? {YES/NO:21197} Grab bars in the bathroom? {YES/NO:21197} Shower chair or bench in shower? {YES/NO:21197} Elevated toilet seat or a handicapped toilet? {YES/NO:21197}  TIMED UP AND GO:  Was the test performed? {YES/NO:21197}.  Length of time to ambulate 10 feet: *** sec.   {  Appearance of PIRJ:1884166}  Cognitive Function:     6CIT Screen 07/25/2018 04/16/2017 01/13/2016  What Year? 0 points 0 points 0 points  What month? 0 points 0 points 0 points  What time? 0 points 0 points 0 points  Count back from 20 0 points 0 points 0 points  Months in reverse 2 points 2 points 0 points  Repeat phrase 0 points 2 points 0 points  Total Score 2 4 0    Immunizations Immunization History  Administered Date(s) Administered  . Fluad Quad(high Dose 65+) 10/28/2018  . Influenza Split 11/04/2010, 10/04/2011  . Influenza,inj,Quad PF,6+ Mos 09/27/2012, 10/02/2013, 12/15/2014, 09/13/2015, 08/31/2016  . Moderna SARS-COVID-2 Vaccination 02/16/2019, 03/16/2019  . Pneumococcal Conjugate-13 08/14/2013  . Pneumococcal Polysaccharide-23 12/02/2009  . Td 12/02/2009    {TDAP status:2101805} {Flu Vaccine status:2101806} {Pneumococcal vaccine status:2101807} {Covid-19 vaccine status:2101808}  Qualifies for Shingles Vaccine? {YES/NO:21197}  Zostavax completed {YES/NO:21197}  {Shingrix Completed?:2101804}  Screening Tests Health Maintenance  Topic Date Due  . INFLUENZA VACCINE  08/03/2019  . TETANUS/TDAP  12/03/2019  . MAMMOGRAM  07/31/2020  . COLONOSCOPY  09/19/2021  . DEXA SCAN  Completed  . COVID-19 Vaccine  Completed  . Hepatitis C Screening  Completed  .  PNA vac Low Risk Adult  Completed    Health Maintenance  Health Maintenance Due  Topic Date Due  . INFLUENZA VACCINE  08/03/2019    {Colorectal cancer screening:2101809} {Mammogram status:21018020} {Bone Density status:21018021}  Lung Cancer Screening: (Low Dose CT Chest recommended if Age 69-80 years, 30 pack-year currently smoking OR have quit w/in 15years.) {DOES NOT does:27190::"does not"} qualify.   Lung Cancer Screening Referral: ***  Additional Screening:  Hepatitis C Screening: {DOES NOT does:27190::"does not"} qualify; Completed ***  Vision Screening: Recommended annual ophthalmology exams for early detection of glaucoma and other disorders of the eye. Is the patient up to date with their annual eye exam?  {YES/NO:21197} Who is the provider or what is the name of the office in which the patient attends annual eye exams? *** If pt is not established with a provider, would they like to be referred to a provider to establish care? {YES/NO:21197}.   Dental Screening: Recommended annual dental exams for proper oral hygiene  Community Resource Referral / Chronic Care Management: CRR required this visit?  {YES/NO:21197}  CCM required this visit?  {YES/NO:21197}     Plan:     I have personally reviewed and noted the following in the patient's chart:   . Medical and social history . Use of alcohol, tobacco or illicit drugs  . Current medications and supplements . Functional ability and status . Nutritional status . Physical activity . Advanced directives . List of other physicians . Hospitalizations, surgeries, and ER visits in previous 12 months . Vitals . Screenings to include cognitive, depression, and falls . Referrals and appointments  In addition, I have reviewed and discussed with patient certain preventive protocols, quality metrics, and best practice recommendations. A written personalized care plan for preventive services as well as general preventive  health recommendations were provided to patient.     Laretta Bolster, [place]   06/05/158   Nurse Notes: ***     This encounter was created in error - please disregard.

## 2019-11-06 NOTE — Telephone Encounter (Signed)
Pt aware and requested refills on Maxide - Medication sent to pharmacy. Pt has f/u in January

## 2019-11-06 NOTE — Telephone Encounter (Signed)
-----   Message from Arnoldo Lenis, MD sent at 11/05/2019 11:40 AM EDT ----- BP's reviewed, overall numbers look good, no changes   Zandra Abts MD

## 2019-11-07 NOTE — Progress Notes (Signed)
This encounter was created in error - please disregard.

## 2019-11-13 ENCOUNTER — Telehealth: Payer: Self-pay | Admitting: *Deleted

## 2019-11-13 NOTE — Telephone Encounter (Signed)
-----   Message from Arnoldo Lenis, MD sent at 11/11/2019 10:34 AM EST ----- Echo looks good, normal heart pumping function. One of her hearl valves is a little bit thicker than normal, this happens over time and can create a heart murmur, but overall is working just fine and not considered to be an issue  Zandra Abts MD

## 2019-11-13 NOTE — Telephone Encounter (Signed)
Pt voiced understanding

## 2019-12-04 DIAGNOSIS — I10 Essential (primary) hypertension: Secondary | ICD-10-CM | POA: Diagnosis not present

## 2020-01-07 DIAGNOSIS — Z79899 Other long term (current) drug therapy: Secondary | ICD-10-CM | POA: Diagnosis not present

## 2020-01-07 DIAGNOSIS — Z0000 Encounter for general adult medical examination without abnormal findings: Secondary | ICD-10-CM | POA: Diagnosis not present

## 2020-01-07 DIAGNOSIS — I10 Essential (primary) hypertension: Secondary | ICD-10-CM | POA: Diagnosis not present

## 2020-01-14 NOTE — Progress Notes (Signed)
Cardiology Office Note  Date: 01/15/2020   ID: Aanvi, Voyles 09-21-1945, MRN [id number]  PCP:  [facility]  Cardiologist:  Carlyle Dolly, MD Electrophysiologist:  None   Chief Complaint: Hypertension.  History of Present Illness: Sara Haynes is a 75 y.o. female with a history of HTN, HLD.  Last saw Dr Harl Bowie 10/14/2019: previous hx of malignant HTN: Side effects/alleergies on multiple BP medications including; Norvasc, HCTZ, lisinopril, aldactone. Renal artery scan normal previously. Home BP's 110-140/60 per patient. Had stopped her labetalol due to making her BP elevated. Plan was for her to bring BP log and cuff with her next visit to compare. She was to continue Maxide. If BP remained elevated would retry labetalol. Limited choice for anti-hypertensive medications due to multiple allergies / intolerances. Echocardiogram was ordered for heart murmur. EKG on visit Sinus Tachcardia rate of 120.  Echocardiogram performed 10/28/2019 showed EF of 60 to 65%.  Mild AR.  She is here today for follow-up on her blood pressure.  She brings with her her home monitor.  We scrolled through multiple blood pressures which seem to be normal at home ranging anywhere from the low 100s to low 431V systolic.  Had very few outliers of sustained blood pressures above that range.  She states she stopped taking the Maxide because it caused her cramping in her legs.  She states she stopped that over a week ago.  She states she feels much better since she stopped the medication.  She denies any anginal or exertional symptoms, palpitations or arrhythmias, orthostatic symptoms, CVA or TIA-like symptoms, palpitations or arrhythmias.  She does have a resting tachycardia of 112.  Her heart rates on her blood pressure monitor at home ranged from the 70s to 90s.       Past Medical History:  Diagnosis Date  . Abnormal electrocardiogram 02/03/2010   Sinus bradycardia  . Anemia   .  Hyperlipidemia 11/04/2010  . Hypertension 1985   Never successfully treated.  . Medication intolerance   . Tinnitus of both ears     Past Surgical History:  Procedure Laterality Date  . ARM SKIN LESION BIOPSY / EXCISION Right    fatty tumor  . CATARACT EXTRACTION  2011   Bilateral  . COLONOSCOPY    . COLONOSCOPY  09/20/2011   Procedure: COLONOSCOPY;  Surgeon: Rogene Houston, MD;  [facility];  Service: Endoscopy;  Laterality: N/A;  930  . EYE SURGERY Right 11/2010   cataract extraction  . EYE SURGERY Left 01/2011   cataract extraction  . HYSTEROSCOPY WITH D & C N/A 05/13/2013   Procedure: DILATATION AND CURETTAGE /HYSTEROSCOPY;  Surgeon: Jonnie Kind, MD;  Location: [facility];  Service: Gynecology;  Laterality: N/A;  . POLYPECTOMY N/A 05/13/2013   Procedure: POLYPECTOMY (removal endometrial polyp);  Surgeon: Jonnie Kind, MD;  Location: [facility];  Service: Gynecology;  Laterality: N/A;  . TUBAL LIGATION  1977    Current Outpatient Medications  Medication Sig Dispense Refill  . aspirin 81 MG EC tablet Take 81 mg by mouth daily.    . calcium-vitamin D (OSCAL WITH D) 500-200 MG-UNIT tablet Take 1 tablet by mouth 2 (two) times daily. 60 tablet 11  . Multiple Vitamins-Minerals (WOMENS MULTIVITAMIN PLUS) TABS Take 1 tablet by mouth daily.     No current facility-administered medications for this visit.   Allergies:  Chlorthalidone, Amlodipine, Hydralazine, Hydrochlorothiazide w-triamterene, Lisinopril, and Spironolactone   Social History: The patient  reports that she has never smoked. She has never used smokeless tobacco. She reports that she does not drink alcohol and does not use drugs.   Family History: The patient's family history includes Alcohol abuse in her father; COPD in her mother; Diabetes in her brother and mother; Hypertension in her brother, brother, brother, sister, sister, and sister; Pneumonia in her father.   ROS:  Please see the history of present  illness. Otherwise, complete review of systems is positive for none.  All other systems are reviewed and negative.   Physical Exam: VS:  BP (!) 168/98   Pulse (!) 112   Ht 5' 6.5" (1.689 m)   Wt 170 lb (77.1 kg)   SpO2 98%   BMI 27.03 kg/m , BMI Body mass index is 27.03 kg/m.  Wt Readings from Last 3 Encounters:  01/15/20 170 lb (77.1 kg)  10/14/19 172 lb (78 kg)  07/09/19 179 lb (81.2 kg)    General: Patient appears comfortable at rest. Neck: Supple, no elevated JVP or carotid bruits, no thyromegaly. Lungs: Clear to auscultation, nonlabored breathing at rest. Cardiac: Tachycardic rate and rhythm, no S3 or significant systolic murmur, no pericardial rub. Extremities: No pitting edema, distal pulses 2+. Skin: Warm and dry. Musculoskeletal: No kyphosis. Neuropsychiatric: Alert and oriented x3, affect grossly appropriate.  ECG:    Recent Labwork: 07/18/2019: ALT 18; AST 18; BUN 22; Creat 1.45; Hemoglobin 11.3; Platelets 179; Potassium 5.0; Sodium 139; TSH 1.93     Component Value Date/Time   CHOL 207 (H) 07/18/2019 1327   TRIG 189 (H) 07/18/2019 1327   HDL 53 07/18/2019 1327   CHOLHDL 3.9 07/18/2019 1327   VLDL 24 12/23/2015 1250   LDLCALC 124 (H) 07/18/2019 1327    Other Studies Reviewed Today:  Echocardiogram 10/28/2019  1. Left ventricular ejection fraction, by estimation, is 60 to 65%. The left ventricle has normal function. The left ventricle has no regional wall motion abnormalities. Left ventricular diastolic parameters are indeterminate. 2. Right ventricular systolic function is normal. The right ventricular size is normal. There is normal pulmonary artery systolic pressure. The estimated right ventricular systolic pressure is 37.6 mmHg. 3. The mitral valve is grossly normal. No evidence of mitral valve regurgitation. 4. The aortic valve is tricuspid. Aortic valve regurgitation is mild. Mild aortic valve sclerosis is present, with no evidence of aortic valve  stenosis. 5. The inferior vena cava is normal in size with greater than 50% respiratory variability, suggesting right atrial pressure of 3 mmHg.   Assessment and Plan:  1. Cardiac murmur   2. Essential hypertension    1. Cardiac murmur Recent echo demonstrated mild aortic regurgitation with EF of 60 to 65%  2. Essential hypertension Patient brings with her the home monitor she has been using at home to measure blood pressures.  We scroll through multiple blood pressures ranging from the low 283T to 517O systolic.  There were very few outliers above 130/80.  She states she stopped her Maxide over a week ago due to cramps in her legs.  Suspect maybe she has whitecoat hypertension.  I advised her anytime she has visit with a physician or provider to bring the blood pressure monitor with her to provide evidence that her home blood pressures are usually within normal limits.  She has stopped Maxide and plans not to start it again.  Advised her to continue to monitor blood pressures if sustained blood pressures above 130/80 to call us back.  Medication Adjustments/Labs and Tests Ordered:  Current medicines are reviewed at length with the patient today.  Concerns regarding medicines are outlined above.   Disposition: Follow-up with Dr. Harl Bowie or [facility] 6 months  Signed, Levell July, NP 01/15/2020 3:10 PM    [facility], [place], [place] [address] Phone: [phone]; Fax: [phone]

## 2020-01-15 ENCOUNTER — Ambulatory Visit (INDEPENDENT_AMBULATORY_CARE_PROVIDER_SITE_OTHER): Payer: Medicare HMO | Admitting: Family Medicine

## 2020-01-15 ENCOUNTER — Encounter: Payer: Self-pay | Admitting: Family Medicine

## 2020-01-15 VITALS — BP 168/98 | HR 112 | Ht 66.5 in | Wt 170.0 lb

## 2020-01-15 DIAGNOSIS — R011 Cardiac murmur, unspecified: Secondary | ICD-10-CM | POA: Diagnosis not present

## 2020-01-15 DIAGNOSIS — I10 Essential (primary) hypertension: Secondary | ICD-10-CM | POA: Diagnosis not present

## 2020-01-15 NOTE — Patient Instructions (Addendum)
Medication Instructions:   Stop Maxzide (Triamterene / HCTZ).   Continue all other medications.    Labwork: none  Testing/Procedures:  none  Follow-Up: 6 months   Any Other Special Instructions Will Be Listed Below (If Applicable).  If you need a refill on your cardiac medications before your next appointment, please call your pharmacy.

## 2020-02-04 DIAGNOSIS — R531 Weakness: Secondary | ICD-10-CM | POA: Diagnosis not present

## 2020-02-04 DIAGNOSIS — I10 Essential (primary) hypertension: Secondary | ICD-10-CM | POA: Diagnosis not present

## 2020-02-04 DIAGNOSIS — Z13220 Encounter for screening for lipoid disorders: Secondary | ICD-10-CM | POA: Diagnosis not present

## 2020-02-04 DIAGNOSIS — Z79899 Other long term (current) drug therapy: Secondary | ICD-10-CM | POA: Diagnosis not present

## 2020-02-04 DIAGNOSIS — Z131 Encounter for screening for diabetes mellitus: Secondary | ICD-10-CM | POA: Diagnosis not present

## 2020-02-10 DIAGNOSIS — I10 Essential (primary) hypertension: Secondary | ICD-10-CM | POA: Diagnosis not present

## 2020-02-10 DIAGNOSIS — E559 Vitamin D deficiency, unspecified: Secondary | ICD-10-CM | POA: Diagnosis not present

## 2020-02-10 DIAGNOSIS — E7849 Other hyperlipidemia: Secondary | ICD-10-CM | POA: Diagnosis not present

## 2020-02-10 DIAGNOSIS — R600 Localized edema: Secondary | ICD-10-CM | POA: Diagnosis not present

## 2020-02-25 DIAGNOSIS — R600 Localized edema: Secondary | ICD-10-CM | POA: Diagnosis not present

## 2020-02-25 DIAGNOSIS — I10 Essential (primary) hypertension: Secondary | ICD-10-CM | POA: Diagnosis not present

## 2020-02-25 DIAGNOSIS — M79644 Pain in right finger(s): Secondary | ICD-10-CM | POA: Diagnosis not present

## 2020-03-31 DIAGNOSIS — M79644 Pain in right finger(s): Secondary | ICD-10-CM | POA: Diagnosis not present

## 2020-03-31 DIAGNOSIS — R600 Localized edema: Secondary | ICD-10-CM | POA: Diagnosis not present

## 2020-03-31 DIAGNOSIS — I10 Essential (primary) hypertension: Secondary | ICD-10-CM | POA: Diagnosis not present

## 2020-03-31 DIAGNOSIS — M79645 Pain in left finger(s): Secondary | ICD-10-CM | POA: Diagnosis not present

## 2020-03-31 DIAGNOSIS — M2540 Effusion, unspecified joint: Secondary | ICD-10-CM | POA: Diagnosis not present

## 2020-04-21 DIAGNOSIS — I160 Hypertensive urgency: Secondary | ICD-10-CM | POA: Diagnosis not present

## 2020-06-15 ENCOUNTER — Ambulatory Visit: Payer: Medicare HMO | Admitting: Internal Medicine

## 2020-06-22 DIAGNOSIS — G629 Polyneuropathy, unspecified: Secondary | ICD-10-CM | POA: Diagnosis not present

## 2020-06-22 DIAGNOSIS — Z803 Family history of malignant neoplasm of breast: Secondary | ICD-10-CM | POA: Diagnosis not present

## 2020-06-22 DIAGNOSIS — Z7982 Long term (current) use of aspirin: Secondary | ICD-10-CM | POA: Diagnosis not present

## 2020-06-22 DIAGNOSIS — K08409 Partial loss of teeth, unspecified cause, unspecified class: Secondary | ICD-10-CM | POA: Diagnosis not present

## 2020-06-22 DIAGNOSIS — R609 Edema, unspecified: Secondary | ICD-10-CM | POA: Diagnosis not present

## 2020-06-22 DIAGNOSIS — Z8249 Family history of ischemic heart disease and other diseases of the circulatory system: Secondary | ICD-10-CM | POA: Diagnosis not present

## 2020-06-22 DIAGNOSIS — I10 Essential (primary) hypertension: Secondary | ICD-10-CM | POA: Diagnosis not present

## 2020-06-22 DIAGNOSIS — Z825 Family history of asthma and other chronic lower respiratory diseases: Secondary | ICD-10-CM | POA: Diagnosis not present

## 2020-06-22 DIAGNOSIS — Z833 Family history of diabetes mellitus: Secondary | ICD-10-CM | POA: Diagnosis not present

## 2020-06-22 DIAGNOSIS — Z008 Encounter for other general examination: Secondary | ICD-10-CM | POA: Diagnosis not present

## 2020-06-22 DIAGNOSIS — Z823 Family history of stroke: Secondary | ICD-10-CM | POA: Diagnosis not present

## 2020-07-12 NOTE — Progress Notes (Signed)
Cardiology Office Note  Date: 07/13/2020   ID: Madalyn, Legner 1945-10-16, MRN [id number]  PCP:  [facility]  Cardiologist:  Carlyle Dolly, MD Electrophysiologist:  None   Chief Complaint: Hypertension.  History of Present Illness: ALEYNAH ROCCHIO is a 75 y.o. female with a history of HTN, HLD.  Last saw Dr Harl Bowie 10/14/2019: previous hx of malignant HTN: Side effects/alleergies on multiple BP medications including; Norvasc, HCTZ, lisinopril, aldactone. Renal artery scan normal previously. Home BP's 110-140/60 per patient. Had stopped her labetalol due to making her BP elevated. Plan was for her to bring BP log and cuff with her next visit to compare. She was to continue Maxide. If BP remained elevated would retry labetalol. Limited choice for anti-hypertensive medications due to multiple allergies / intolerances. Echocardiogram was ordered for heart murmur. EKG on visit Sinus Tachcardia rate of 120.  Echocardiogram performed 10/28/2019 showed EF of 60 to 65%.  Mild AR.  She was here at last visit for follow-up on her blood pressure.  She brought her home monitor  We scrolled through multiple blood pressures which seem to be normal at home ranging anywhere from the low 100s to low 852D systolic.  Had very few outliers of sustained blood pressures above that range.  She states she stopped taking the Maxide because it caused  cramping in her legs.  She stated she stopped that over a week prior.  She stated she felt much better since she stopped the medication.  She denied any anginal or exertional symptoms, palpitations or arrhythmias, orthostatic symptoms, CVA or TIA-like symptoms, palpitations or arrhythmias.  She did have a resting tachycardia of 112.  Her heart rates on her blood pressure monitor at home ranged from the 70s to 90s.    She presents today for follow-up today.  Initial blood pressure on arrival was 200/110.  Rechecked her blood pressure again in her  left arm which was even higher at 220/110.  She states this morning her systolic blood pressures ran in the 120s to low 130s over 60s to 70s.  She did not bring her home blood pressure machine with her today.  I am concerned about her apparent labile blood pressures.  She states she has been dealing with this for quite some time with blood pressure being elevated at medical providers offices.  She is currently not on any antihypertensive medication other than torsemide 5 mg daily.  She had previously stopped Maxide due to cramping in her legs.  She continues to be adamant about her blood pressures being well controlled at home.  She denies any anginal or exertional symptoms, orthostatic symptoms, CVA or TIA-like symptoms, PND or orthopnea.  Denies any claudication-like symptoms, DVT or PE-like symptoms.  She had a previous renal ultrasound back in 2013. On 03/16/2011.  Technologist notes state duplex of the imaging of the abdominal aorta with color Doppler revealed it to be normal caliber without focal dilatation.  The kidneys were normal in size bilaterally.  By color and spectral Doppler there was excellent flow through the parenchyma and cortex of both kidneys.  The renal arteries were widely patent with normal velocity flow bilaterally.  Impression was normal caliber aorta, normal bilateral kidney size.  Normal renal arteries bilaterally. Most recent echocardiogram on 10/28/2019 demonstrated EF of 60 to 65%.  No WMA's.  Indeterminate diastolic parameters.  RV normal size with normal PASP.  Mild aortic regurgitation.     Past Medical History:  Diagnosis Date  Abnormal electrocardiogram 02/03/2010   Sinus bradycardia   Anemia    Hyperlipidemia 11/04/2010   Hypertension 1985   Never successfully treated.   Medication intolerance    Tinnitus of both ears     Past Surgical History:  Procedure Laterality Date   ARM SKIN LESION BIOPSY / EXCISION Right    fatty tumor   CATARACT EXTRACTION  2011    Bilateral   COLONOSCOPY     COLONOSCOPY  09/20/2011   Procedure: COLONOSCOPY;  Surgeon: Rogene Houston, MD;  [facility]: [facility];  Service: Endoscopy;  Laterality: N/A;  930   EYE SURGERY Right 11/2010   cataract extraction   EYE SURGERY Left 01/2011   cataract extraction   HYSTEROSCOPY WITH D & C N/A 05/13/2013   Procedure: DILATATION AND CURETTAGE /HYSTEROSCOPY;  Surgeon: Jonnie Kind, MD;  Location: [facility];  Service: Gynecology;  Laterality: N/A;   POLYPECTOMY N/A 05/13/2013   Procedure: POLYPECTOMY (removal endometrial polyp);  Surgeon: Jonnie Kind, MD;  Location: [facility];  Service: Gynecology;  Laterality: N/A;   TUBAL LIGATION  1977    Current Outpatient Medications  Medication Sig Dispense Refill   aspirin 81 MG EC tablet Take 81 mg by mouth daily.     calcium-vitamin D (OSCAL WITH D) 500-200 MG-UNIT tablet Take 1 tablet by mouth 2 (two) times daily. 60 tablet 11   Multiple Vitamins-Minerals (WOMENS MULTIVITAMIN PLUS) TABS Take 1 tablet by mouth daily.     torsemide (DEMADEX) 5 MG tablet Take 5 mg by mouth daily.     No current facility-administered medications for this visit.   Allergies:  Chlorthalidone, Amlodipine, Hydralazine, Hydrochlorothiazide w-triamterene, Lisinopril, and Spironolactone   Social History: The patient  reports that she has never smoked. She has never used smokeless tobacco. She reports that she does not drink alcohol and does not use drugs.   Family History: The patient's family history includes Alcohol abuse in her father; COPD in her mother; Diabetes in her brother and mother; Hypertension in her brother, brother, brother, sister, sister, and sister; Pneumonia in her father.   ROS:  Please see the history of present illness. Otherwise, complete review of systems is positive for none.  All other systems are reviewed and negative.   Physical Exam: VS:  BP (!) 200/110   Pulse 89   Ht 5\' 6"  (1.676 m)   Wt 162 lb (73.5 kg)   SpO2 98%   BMI  26.15 kg/m , BMI Body mass index is 26.15 kg/m.  Wt Readings from Last 3 Encounters:  07/13/20 162 lb (73.5 kg)  01/15/20 170 lb (77.1 kg)  10/14/19 172 lb (78 kg)    General: Patient appears comfortable at rest. Neck: Supple, no elevated JVP or carotid bruits, no thyromegaly. Lungs: Clear to auscultation, nonlabored breathing at rest. Cardiac: Tachycardic rate and rhythm, no S3 or significant systolic murmur, no pericardial rub. Extremities: No pitting edema, distal pulses 2+. Skin: Warm and dry. Musculoskeletal: No kyphosis. Neuropsychiatric: Alert and oriented x3, affect grossly appropriate.  ECG:    Recent Labwork: 07/18/2019: ALT 18; AST 18; BUN 22; Creat 1.45; Hemoglobin 11.3; Platelets 179; Potassium 5.0; Sodium 139; TSH 1.93     Component Value Date/Time   CHOL 207 (H) 07/18/2019 1327   TRIG 189 (H) 07/18/2019 1327   HDL 53 07/18/2019 1327   CHOLHDL 3.9 07/18/2019 1327   VLDL 24 12/23/2015 1250   LDLCALC 124 (H) 07/18/2019 1327    Other Studies Reviewed Today:  Echocardiogram 10/28/2019  1. Left ventricular ejection fraction, by estimation, is 60 to 65%. The left ventricle has normal function. The left ventricle has no regional wall motion abnormalities. Left ventricular diastolic parameters are indeterminate. 2. Right ventricular systolic function is normal. The right ventricular size is normal. There is normal pulmonary artery systolic pressure. The estimated right ventricular systolic pressure is 67.3 mmHg. 3. The mitral valve is grossly normal. No evidence of mitral valve regurgitation. 4. The aortic valve is tricuspid. Aortic valve regurgitation is mild. Mild aortic valve sclerosis is present, with no evidence of aortic valve stenosis. 5. The inferior vena cava is normal in size with greater than 50% respiratory variability, suggesting right atrial pressure of 3 mmHg.   Assessment and Plan:  1. Cardiac murmur   2. Essential hypertension     1.  Cardiac murmur Recent echo demonstrated mild aortic regurgitation with EF of 60 to 65%.  No DOE or SOB.  No dizziness or lightheadedness.   2. Essential hypertension At last visit she brought her home BP monitor to measure blood pressures.  We scrolled through multiple systolic blood pressures ranging from the low 419F to 790W systolic.  There were very few outliers above 130/80.  She stated she stopped her Maxide over a week prior due to cramps in her legs.  She states she has been dealing with the issue of elevated blood pressure when interacting with medical providers for quite some time.  At that time I suspected maybe she had whitecoat hypertension.  I advised her anytime she had a visit with a physician or provider to bring the blood pressure monitor with her to provide evidence that her home blood pressures are usually within normal limits.  She had stopped Maxide due to leg cramps and planned not to start it again.  We did not treat her blood pressure at prior visit due to normal numbers on her home blood pressure monitor.  Advised her to continue to monitor blood pressures and if sustained blood pressures above 130/80 to call us back.  She presents today for follow-up.  She did not bring her home blood pressure monitor with her but states she took her blood pressure twice this morning and both were within normal limits.  She arrives today with a blood pressure of 200/110.  Subsequent check after resting was in the same range.  I am concerned she may be having labile blood pressures at home.  I asked her if she would be willing to go to the specialty hypertension clinic to be checked out further to rule out any other causes of her hypertension.  Previous renal Dopplers in 2013 showed normal renal arteries.  See renal artery duplex under procedures tab in chart review.  Please refer patient to hypertension clinic Dr. Oval Linsey.  Medication Adjustments/Labs and Tests Ordered: Current medicines are  reviewed at length with the patient today.  Concerns regarding medicines are outlined above.   Disposition: Follow-up with Dr. Harl Bowie or [facility] 6 months  Signed, Levell July, NP 07/13/2020 3:16 PM    [facility] [place], [place], [place] [address] Phone: [phone]; Fax: [phone]

## 2020-07-13 ENCOUNTER — Ambulatory Visit (INDEPENDENT_AMBULATORY_CARE_PROVIDER_SITE_OTHER): Payer: Medicare HMO | Admitting: Family Medicine

## 2020-07-13 ENCOUNTER — Encounter: Payer: Self-pay | Admitting: Family Medicine

## 2020-07-13 VITALS — BP 200/110 | HR 89 | Ht 66.0 in | Wt 162.0 lb

## 2020-07-13 DIAGNOSIS — R011 Cardiac murmur, unspecified: Secondary | ICD-10-CM | POA: Diagnosis not present

## 2020-07-13 DIAGNOSIS — I10 Essential (primary) hypertension: Secondary | ICD-10-CM

## 2020-07-13 NOTE — Patient Instructions (Signed)
Medication Instructions:  Continue all current medications.  Labwork: none  Testing/Procedures: none  Follow-Up: 6 months   Any Other Special Instructions Will Be Listed Below (If Applicable). You have been referred to:  Hypertension Clinic   If you need a refill on your cardiac medications before your next appointment, please call your pharmacy.

## 2020-07-19 NOTE — Progress Notes (Signed)
Office Visit Note  Patient: Sara Haynes             Date of Birth: [birth year]           MRN: [id number]             PCP: [facility] Referring: Denyce Robert, FNP Visit Date: 07/20/2020 Occupation: Retired, formerly Insurance underwriter for [employer]:  New Patient (Initial Visit) (Patient notices increased symptoms while taking blood pressure medication- swelling, stiffness, and generalized joint and muscle pain and aches. Patient notices occasional bilateral foot swelling. Patient currently complains of RIF and LMF stiffness. )   History of Present Illness: Sara Haynes is a 75 y.o. female with a history of malignant hypertension here for joint swelling involving both hands.  She reports a history of episodic joint pain and swelling in her feet 1 or the other at a time with symptoms lasting up to 2 or 3 weeks.  More recently she experienced pain and swelling with decreased range of motion involving her right index finger and left middle finger.  This improved with resolution of the swelling although she continues to report a decreased range of movement in the left index finger.  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  She reports a longstanding issues with her antihypertensive medications stated that when she takes all of them leads her to develop joint pains and cramping especially in her feet and ankles and the symptoms improve when coming off of some of these medication.  This issue she reports has been going off and on for decades.  Activities of Daily Living:  Patient reports morning stiffness for 0 minutes.   Patient Denies nocturnal pain.  Difficulty dressing/grooming: Denies Difficulty climbing stairs: Denies Difficulty getting out of chair: Denies Difficulty using hands for taps, buttons, cutlery, and/or writing: Denies  Review of Systems  Constitutional:   Negative for fatigue.  HENT:  Positive for mouth dryness. Negative for mouth sores and nose dryness.   Eyes:  Negative for pain, itching, visual disturbance and dryness.  Respiratory:  Negative for cough, hemoptysis, shortness of breath and difficulty breathing.   Cardiovascular:  Negative for chest pain, palpitations and swelling in legs/feet.  Gastrointestinal:  Negative for abdominal pain, blood in stool, constipation and diarrhea.  Endocrine: Negative for increased urination.  Genitourinary:  Negative for painful urination.  Musculoskeletal:  Negative for joint pain, joint pain, joint swelling, myalgias, muscle weakness, morning stiffness, muscle tenderness and myalgias.  Skin:  Negative for color change, rash and redness.  Allergic/Immunologic: Negative for susceptible to infections.  Neurological:  Negative for dizziness, numbness, headaches, memory loss and weakness.  Hematological:  Negative for swollen glands.  Psychiatric/Behavioral:  Negative for confusion and sleep disturbance.    PMFS History:  Patient Active Problem List   Diagnosis Date Noted   Joint swelling 07/20/2020   Osteoarthritis of both hands 07/20/2020   Cellulitis of right foot 07/09/2019   IGT (impaired glucose tolerance) 62/83/6629   Metabolic syndrome 47/65/4650   Cystocele with uterine descensus 04/28/2013   Personal history of noncompliance with medical treatment, presenting hazards to health 11/29/2012   Medication intolerance    Hyperlipidemia LDL goal <100 11/04/2010   ABNORMAL ELECTROCARDIOGRAM 02/03/2010   Malignant hypertension 12/02/2009    Past Medical History:  Diagnosis Date   Abnormal electrocardiogram 02/03/2010   Sinus bradycardia   Anemia  Hyperlipidemia 11/04/2010   Hypertension 1985   Never successfully treated.   Medication intolerance    Tinnitus of both ears     Family History  Problem Relation Age of Onset   COPD Mother    Diabetes Mother    Pneumonia Father    Alcohol  abuse Father    Hypertension Sister    Hypertension Sister    Breast cancer Sister    Hypertension Sister    Hypertension Brother    Diabetes Brother    Hypertension Brother    Hypertension Brother    Past Surgical History:  Procedure Laterality Date   ARM SKIN LESION BIOPSY / EXCISION Right    fatty tumor   CATARACT EXTRACTION  01/02/2009   Bilateral   COLONOSCOPY     COLONOSCOPY  09/20/2011   Procedure: COLONOSCOPY;  Surgeon: Rogene Houston, MD;  [facility];  Service: Endoscopy;  Laterality: N/A;  930   EYE SURGERY Right 11/03/2010   cataract extraction   EYE SURGERY Left 01/03/2011   cataract extraction   HYSTEROSCOPY WITH D & C N/A 05/13/2013   Procedure: DILATATION AND CURETTAGE /HYSTEROSCOPY;  Surgeon: Jonnie Kind, MD;  Location: [facility];  Service: Gynecology;  Laterality: N/A;   POLYPECTOMY N/A 05/13/2013   Procedure: POLYPECTOMY (removal endometrial polyp);  Surgeon: Jonnie Kind, MD;  Location: [facility];  Service: Gynecology;  Laterality: N/A;   TOOTH EXTRACTION  2021   TUBAL LIGATION  01/03/1975   Social History   Social History Narrative   Not on file   Immunization History  Administered Date(s) Administered   Fluad Quad(high Dose 65+) 10/28/2018   Influenza Split 11/04/2010, 10/04/2011   Influenza,inj,Quad PF,6+ Mos 09/27/2012, 10/02/2013, 12/15/2014, 09/13/2015, 08/31/2016   Moderna Sars-Covid-2 Vaccination 02/16/2019, 03/16/2019   Pneumococcal Conjugate-13 08/14/2013   Pneumococcal Polysaccharide-23 12/02/2009   Td 12/02/2009     Objective: Vital Signs: BP (!) 227/121 (BP Location: Left Arm, Patient Position: Sitting, Cuff Size: Normal)   Pulse 87   Ht 5' 4.5" (1.638 m)   Wt 164 lb (74.4 kg)   BMI 27.72 kg/m    Physical Exam HENT:     Mouth/Throat:     Mouth: Mucous membranes are moist.     Pharynx: Oropharynx is clear.  Eyes:     Conjunctiva/sclera: Conjunctivae normal.  Cardiovascular:     Rate and Rhythm: Normal rate and  regular rhythm.     Comments: Systolic murmur Pulmonary:     Effort: Pulmonary effort is normal.     Breath sounds: Normal breath sounds.  Skin:    General: Skin is warm and dry.     Findings: No rash.  Neurological:     General: No focal deficit present.     Mental Status: She is alert.     Deep Tendon Reflexes: Reflexes normal.  Psychiatric:        Mood and Affect: Mood normal.     Musculoskeletal Exam:  Shoulders full ROM no tenderness or swelling Elbows full ROM no tenderness or swelling Wrists full ROM no tenderness or swelling Fingers Bony enlargement of second PIP with decreased flexion and extension range of motion other digits appear normal with full range of motion Knees full ROM no tenderness or swelling Ankles full ROM no tenderness or swelling   Investigation: No additional findings.  Imaging: XR Hand 2 View Left  Result Date: 07/20/2020 X-ray left hand 2 views Radiocarpal joint space appears normal.  Carpal joint spaces are intact  mild degenerative arthritis of the first [facility] joint.  MCP PIP and DIP joint spaces appear fairly preserved.  Possible very early lateral osteophyte formation starting in second and third digits distally.  No erosions or focal bone demineralization are seen. Impression Mild degenerative arthritis with no erosive inflammatory arthritis changes seen  XR Hand 2 View Right  Result Date: 07/20/2020 X-ray right hand 2 views Radiocarpal joint space appears normal.  Few cystic changes in carpal bones and mild degenerative arthritis of first CMC joint.  There is some slight subluxation of the first MCP joint.  Of the MCP joints appear normal.  Narrowing and lateral osteophyte formation of second PIP others appear normal.  Mild lateral joint space narrowing at DIPs.  No erosions or focal bone demineralization are seen. Impression Mild degenerative arthritis mostly of the thumb and second digit no obvious inflammatory arthritis changes seen   Recent  Labs: Lab Results  Component Value Date   WBC 4.1 07/18/2019   HGB 11.3 (L) 07/18/2019   PLT 179 07/18/2019   NA 139 07/18/2019   K 5.0 07/18/2019   CL 103 07/18/2019   CO2 27 07/18/2019   GLUCOSE 93 07/18/2019   BUN 22 07/18/2019   CREATININE 1.45 (H) 07/18/2019   BILITOT 0.7 07/18/2019   ALKPHOS 72 07/22/2016   AST 18 07/18/2019   ALT 18 07/18/2019   PROT 8.2 (H) 07/18/2019   ALBUMIN 4.1 07/22/2016   CALCIUM 10.0 07/18/2019   GFRAA 41 (L) 07/18/2019    Speciality Comments: No specialty comments available.  Procedures:  No procedures performed Allergies: Chlorthalidone, Amlodipine, Hydralazine, Hydrochlorothiazide w-triamterene, Lisinopril, and Spironolactone   Assessment / Plan:     Visit Diagnoses: Joint swelling - Plan: Sedimentation rate, Rheumatoid factor, Cyclic citrul peptide antibody, IgG, Uric acid  He reports episodic inflammatory arthritis but improved at this time not on any specific treatment.  Checking lab markers including sedimentation rate, rheumatoid factor, CCP, and uric acid.  She has a history of moderate renal function decrease and cardiac medications.  If no specific findings may benefit from as needed follow-up if another flare develops.  Osteoarthritis of both hands, unspecified osteoarthritis type - Plan: XR Hand 2 View Right, XR Hand 2 View Left  Exam of finger joints today demonstrates bony changes consistent with osteoarthritis checking bilateral hand x-rays for evaluation and rule out erosive disease underlying these changes.  Malignant hypertension  Severely uncontrolled hypertension with no symptoms in the clinic today.  She states this has been the case for many years both when taking her medications and when intermittently stopping her medications.  She has future appointment scheduled for cardiology. I recommend continuing her antihypertensives and more immediate follow-up in at least primary care clinic.  Orders: Orders Placed This  Encounter  Procedures   XR Hand 2 View Right   XR Hand 2 View Left   Sedimentation rate   Rheumatoid factor   Cyclic citrul peptide antibody, IgG   Uric acid    No orders of the defined types were placed in this encounter.    Follow-Up Instructions: No follow-ups on file.   Collier Salina, MD  Note - This record has been created using [employer].  Chart creation errors have been sought, but may not always  have been located. Such creation errors do not reflect on  the standard of medical care.

## 2020-07-20 ENCOUNTER — Encounter: Payer: Self-pay | Admitting: Internal Medicine

## 2020-07-20 ENCOUNTER — Ambulatory Visit: Payer: Self-pay

## 2020-07-20 ENCOUNTER — Ambulatory Visit (INDEPENDENT_AMBULATORY_CARE_PROVIDER_SITE_OTHER): Payer: Medicare HMO | Admitting: Internal Medicine

## 2020-07-20 ENCOUNTER — Other Ambulatory Visit: Payer: Self-pay

## 2020-07-20 VITALS — BP 227/121 | HR 87 | Ht 64.5 in | Wt 164.0 lb

## 2020-07-20 DIAGNOSIS — I10 Essential (primary) hypertension: Secondary | ICD-10-CM | POA: Diagnosis not present

## 2020-07-20 DIAGNOSIS — M2540 Effusion, unspecified joint: Secondary | ICD-10-CM

## 2020-07-20 DIAGNOSIS — M19042 Primary osteoarthritis, left hand: Secondary | ICD-10-CM | POA: Diagnosis not present

## 2020-07-20 DIAGNOSIS — M19041 Primary osteoarthritis, right hand: Secondary | ICD-10-CM | POA: Insufficient documentation

## 2020-07-20 NOTE — Patient Instructions (Addendum)
Rheumatoid Factor Test Why am I having this test? The rheumatoid factor test is used to help diagnose certain autoimmune diseases. Normally, your body makes protective proteins called antibodies (IgM, IgG, and IgA) to help fight off infections. If you have an autoimmune disease, your body may make a collection of antibodies that do not function correctly (autoantibodies). They attack tissues that are wrongly identified as foreign. In some autoimmunediseases, these autoantibodies are known as the rheumatoid factor (RF). You may have this test if your health care provider suspects that you have an autoimmune disease, such as: Rheumatoid arthritis (RA). Systemic lupus erythematosus (SLE). Sjgren's syndrome. Mixed connective tissue disease. A majority of people who have rheumatoid arthritis have a positive rheumatoid factor. Raised levels of these autoantibodies can also sometimes be a sign of other autoimmune diseases. However, it is also possible for the RF test to be negative even when a disease is present. Likewise, a small number of people may have a positive RF test when an autoimmune disease is not actually present.Other tests may be needed to help make a diagnosis. What is being tested? This test checks your blood for the RF autoantibodies. What kind of sample is taken?  A blood sample is required for this test. It is usually collected by insertinga needle into a blood vessel or by sticking a finger with a small needle. How are the results reported? Your test result will be reported as either positive or negative for RFautoantibodies. What do the results mean? A negative result means that no RF or only a small amount was found in yourblood. This means that it is unlikely that you have an autoimmune disease. A positive result means that a larger amount of RF autoantibodies was found in your blood. This may indicate that you have RA or another autoimmune disease. Your health care provider will  talk to you about doing more tests to confirmyour results. Talk with your health care provider about what your results mean. Questions to ask your health care provider Ask your health care provider, or the department that is doing the test: When will my results be ready? How will I get my results? What are my treatment options? What other tests do I need? What are my next steps? Summary The rheumatoid factor test is used to help diagnose certain autoimmune diseases. In some autoimmune diseases, the body makes autoantibodies known as the rheumatoid factor (RF), which attack tissues that are wrongly identified as foreign. A negative result means that no RF or only a small amount was found in your blood. A positive result means that a larger amount of RF autoantibodies was found in your blood. Talk with your health care provider about what your results mean.  Anticyclic-Citrullinated Peptide Antibody Test Why am I having this test? You may have the anticyclic-citrullinated peptide antibody test done to help: Diagnose rheumatoid arthritis (RA). RA is a long-term (chronic) disease that causes inflammation in the joints. Determine the severity of your RA, including how much worse it is getting (progression). This test may be done if you have unexplained joint inflammation and have previously tested negative for rheumatoid factor. It may also be done if you have been diagnosed with undifferentiated arthritis and your health careprovider suspects rheumatoid arthritis. What is being tested? This test checks your blood for the presence of anticyclic-citrullinated peptide antibodies. Antibodies are cells that are part of the body's disease-fighting (immune) system. These antibodies appear early in the course of RA and are thought tobe directly  involved in the progression of the disease. What kind of sample is taken?  A blood sample is required for this test. It is usually collected by insertinga  needle into a blood vessel. How are the results reported? Your test results will be reported as either positive or negative. A result is considered negative if there is less than 20 units of the antibody per mL ofblood. What do the results mean? A positive blood test may mean that you have RA. A negative blood test means that it is less likely that you have RA. However, a negative test does not completely rule out rheumatoid arthritis. Talk with your health care provider about what your results mean. Questions to ask your health care provider Ask your health care provider, or the department that is doing the test: When will my results be ready? How will I get my results? What are my treatment options? What other tests do I need? What are my next steps? Summary The anticyclic-citrullinated peptide antibody blood test may be done to help your health care provider diagnose rheumatoid arthritis (RA). This test checks your blood for the presence of anticyclic-citrullinated peptide antibodies. These antibodies appear early in the course of RA. A positive blood test may mean that you have RA.   Uric Acid Test Why am I having this test? Uric acid is a chemical that gets released when certain substances in the body (purines) break down. Normally, uric acid is filtered out of the blood by the kidneys, and then it leaves the body through urine. If your body makes too much uric acid, or if your kidneys are not removing enough of it, uric acid can start to form crystals that build up in your joints or kidneys. Uric acid crystals in your joints can cause a type of arthritis (gout). Crystals in your urine can form kidney stones. You may have a uric acid test: If you have joint pain or swelling that may be caused by gout. If you frequently have kidney stones. To help diagnose or monitor treatment of gout. To monitor radiation or chemotherapy treatment. To monitor kidney function or diagnose kidney  disorders. What is being tested? This test measures the amount of uric acid in your blood or urine. What kind of sample is taken? This test may be done with a blood sample or a urine sample. A blood sample is usually collected by inserting a needle into a blood vessel. You may have a blood sample taken if: You are receiving treatment that increases purines in your body, such as radiation or chemotherapy. You have joint pain that may be caused by gout. You may have a urine sample taken if you have kidney stones. You may be asked to collect urine samples at home over a period of 24 hours. How do I collect samples at home? When collecting a urine sample at home, make sure you: Use supplies and instructions that you received from the lab. Collect urine only in the germ-free (sterile) cup that you received from the lab. Do not let any toilet paper or stool (feces) get into the cup. Refrigerate the sample until you can return it to the lab. Return the sample(s) to the lab as instructed. How do I prepare for this test? Follow instructions from your health care provider about: Eating or drinking restrictions. You may need to stop eating and drinking everything except water starting 4 hours before the test. Changing or stopping your regular medicines. Some medicines can affect uric  acid levels. Tell a health care provider about: Recent intense exercise. If you have recently exercised a lot, this may affect your test results. Any allergies you have. All medicines you are taking, including vitamins, herbs, eye drops, creams, and over-the-counter medicines. Any blood disorders you have. Any surgeries you have had. Any medical conditions you have. Whether you are pregnant or may be pregnant. How are the results reported? Your results will be reported as a value that indicates how much uric acid is in your blood or urine. Your health care provider will compare your results to normal ranges that were  established after testing a large group of people (reference ranges). Reference ranges may vary among labs and hospitals. For this test, common reference ranges are: Blood test: Adult female: 4.0-8.5 mg/dL or 0.24-0.51 mmol/L. Adult female: 2.7-7.3 mg/dL or 0.16-0.43 mmol/L. Child: 2.5-5.5 mg/dL or 0.12-0.32 mmol/L. Newborn: 2.0-6.2 mg/dL. Urine test: 250-750 mg/24 hr or 1.48-4.43 mmol/day (SI units). What do the results mean? Results within your reference range are considered normal, meaning that youhave a normal amount of uric acid in your body. Results that are higher than your reference range may mean that: Your body is making too much uric acid. Your kidneys are not removing enough uric acid. Your gout treatment plan needs to be adjusted, if applicable. You may need more tests to determine what is causing high uric acid levels. Possible causes include: Gout. Kidney disease. Cancer or cancer treatment. A diet high in purines. Alcohol abuse. Diabetes (diabetes mellitus). Results that are below your reference range mean that you have too little uric acid in your body. This is usually caused by certain medicines and is usuallynot serious. Talk with your health care provider about what your results mean. Questions to ask your health care provider Ask your health care provider, or the department that is doing the test: When will my results be ready? How will I get my results? What are my treatment options? What other tests do I need? What are my next steps? Summary Uric acid is a chemical that gets released when certain substances in the body (purines) break down. If your body makes too much uric acid, or if your kidneys are not removing enough of it, uric acid can start to form crystals that build up in your joints or kidneys. Uric acid crystals in your joints can cause a type of arthritis (gout). Crystals in your urine can form kidney stones. This test measures the amount of uric acid  in your blood or urine.

## 2020-07-21 LAB — URIC ACID: Uric Acid, Serum: 9.6 mg/dL — ABNORMAL HIGH (ref 2.5–7.0)

## 2020-07-21 LAB — CYCLIC CITRUL PEPTIDE ANTIBODY, IGG: Cyclic Citrullin Peptide Ab: <16 UNITS

## 2020-07-21 LAB — SEDIMENTATION RATE: Sed Rate: 11 mm/h (ref 0–30)

## 2020-07-21 LAB — RHEUMATOID FACTOR: Rheumatoid fact SerPl-aCnc: <14 IU/mL (ref <14)

## 2020-07-23 NOTE — Progress Notes (Signed)
Lab tests are negative for rheumatoid arthritis but her uric acid is high, this can cause joint pain and swelling due to gout. Otherwise her xrays just show osteoarthritis changes. I don't think she needs to start new medicine at this time, but if she experiences more flare ups or attack of joint inflammation to see her back at that time.

## 2020-08-18 DIAGNOSIS — I10 Essential (primary) hypertension: Secondary | ICD-10-CM | POA: Diagnosis not present

## 2020-08-18 DIAGNOSIS — E7849 Other hyperlipidemia: Secondary | ICD-10-CM | POA: Diagnosis not present

## 2020-08-24 DIAGNOSIS — E7849 Other hyperlipidemia: Secondary | ICD-10-CM | POA: Diagnosis not present

## 2020-08-24 DIAGNOSIS — I10 Essential (primary) hypertension: Secondary | ICD-10-CM | POA: Diagnosis not present

## 2020-08-24 DIAGNOSIS — Z9119 Patient's noncompliance with other medical treatment and regimen: Secondary | ICD-10-CM | POA: Diagnosis not present

## 2020-09-17 NOTE — Progress Notes (Signed)
[facility] Initial Assessment:    Date:  09/20/2020   ID:  Sara [place], Sara [place] May 10, 1945, MRN [id number]  PCP:  Sara [facility]  Cardiologist:  Carlyle Dolly, MD  Nephrologist:  Referring MD: Verta Ellen., NP   CC: Hypertension  History of Present Illness:    Sara [facility] is a 75 y.o. female with a hx of anemia, hyperlipidemia, and hypertension, here to establish care in the [facility]. She saw Levell July, NP 07/13/2020 and her blood pressure was 200/110. Her home blood pressures were mostly controlled on her monitor. She has struggled with intolerance with many medications. At her last visit with Levell July, NP she decided to restart Maxide. She had renal artery dopplers 03/2011 that were normal. She had an Echo 10//2021 wit [id number] [id number] and diastolic function was indeterminate.  Today, she is accompanied by her daughter. [name] has been dx with hypertension for 40+ years. At home, her blood pressure has been as low as 115/95, but averages 0000000 systolic. She notes that her blood pressure is typically higher in [place]. In a typical day she does housework mostly. For exercise, she does aerobics at home and walks 2-3 times a week. She does cook at home but orders out frequently, and she has noticed this affecting her blood pressure. She enjoys fried foods including fish and chicken. At this time she is no longer drinking soda, and she does not like drinking coffee. She may have green tea and takes waxseed oil supplements. Does not consume any alcohol and has never been a smoker. On previous antihypertensives she experienced side effects of bilateral LE muscle cramps, soreness, and edema. These symptoms improve when not on antihypertensives. She no longer is taking torsemide. Her cholesterol has not been managed by medication. She does not believe she snores and will usually wake up feeling well-rested. She denies any  palpitations, chest pain, or shortness of breath. No lightheadedness, headaches, syncope, orthopnea, or PND. Also has no exertional symptoms. Her 10-year risk score is 16.6%. She plans on taking a trip that will require her first flight on an airplane.   Previous antihypertensives: Amlodipine HCTZ Lisinopril Spironolactone Labetalol Hydralazine   Past Medical History:  Diagnosis Date   Abnormal electrocardiogram 02/03/2010   Sinus bradycardia   Anemia    Hyperlipidemia 11/04/2010   Hypertension 1985   Never successfully treated.   Medication intolerance    Tinnitus of both ears     Past Surgical History:  Procedure Laterality Date   ARM SKIN LESION BIOPSY / EXCISION Right    fatty tumor   CATARACT EXTRACTION  01/02/2009   Bilateral   COLONOSCOPY     COLONOSCOPY  09/20/2011   Procedure: COLONOSCOPY;  Surgeon: Rogene Houston, MD;  Location: [facility];  Service: Endoscopy;  Laterality: N/A;  930   EYE SURGERY Right 11/03/2010   cataract extraction   EYE SURGERY Left 01/03/2011   cataract extraction   HYSTEROSCOPY WITH D & C N/A 05/13/2013   Procedure: DILATATION AND CURETTAGE /HYSTEROSCOPY;  Surgeon: Jonnie Kind, MD;  Location: [facility];  Service: Gynecology;  Laterality: N/A;   POLYPECTOMY N/A 05/13/2013   Procedure: POLYPECTOMY (removal endometrial polyp);  Surgeon: Jonnie Kind, MD;  Location: [facility];  Service: Gynecology;  Laterality: N/A;   TOOTH EXTRACTION  2021   TUBAL LIGATION  01/03/1975    Current Medications: Current Meds  Medication Sig   aspirin 81 MG EC tablet  Take 81 mg by mouth daily.   calcium-vitamin D (OSCAL WITH D) 500-200 MG-UNIT tablet Take 1 tablet by mouth 2 (two) times daily. (Patient taking differently: Take 1 tablet by mouth daily.)   Multiple Vitamins-Minerals (WOMENS MULTIVITAMIN PLUS) TABS Take 1 tablet by mouth daily.   [DISCONTINUED] torsemide (DEMADEX) 5 MG tablet Take 5 mg by mouth daily.     Allergies:   Chlorthalidone,  Amlodipine, Hydralazine, Hydrochlorothiazide w-triamterene, Lisinopril, and Spironolactone   Social History   Socioeconomic History   Marital status: Married    Spouse name: Not on file   Number of children: 3   Years of education: Not on file   Highest education level: Not on file  Occupational History   Occupation: Retired  Tobacco Use   Smoking status: Never   Smokeless tobacco: Never  Vaping Use   Vaping Use: Never used  Substance and Sexual Activity   Alcohol use: No   Drug use: No   Sexual activity: Never    Birth control/protection: Post-menopausal  Other Topics Concern   Not on file  Social History Narrative   Not on file   Social Determinants of Health   Financial Resource Strain: Low Risk    Difficulty of Paying Living Expenses: Not hard at all  Food Insecurity: No Food Insecurity   Worried About Charity fundraiser in the Last Year: Never true   [place] in the Last Year: Never true  Transportation Needs: No Transportation Needs   Lack of Transportation (Medical): No   Lack of Transportation (Non-Medical): No  Physical Activity: Inactive   Days of Exercise per Week: 0 days   Minutes of Exercise per Session: 0 min  Stress: Not on file  Social Connections: Not on file     Family History: The patient's family history includes Alcohol abuse in her father; Breast cancer in her sister; COPD in her mother; Diabetes in her brother and mother; Hypertension in her brother, brother, brother, sister, sister, and sister; Pneumonia in her father.  ROS:   Please see the history of present illness.    All other systems reviewed and are negative.  EKGs/Labs/Other Studies Reviewed:    Echo 10/28/2019:  1. Left ventricular ejection fraction, by estimation, is 60 to 65%. The  left ventricle has normal function. The left ventricle has no regional  wall motion abnormalities. Left ventricular diastolic parameters are  indeterminate.   2. Right ventricular systolic  function is normal. The right ventricular  size is normal. There is normal pulmonary artery systolic pressure. The  estimated right ventricular systolic pressure is 99991111 mmHg.   3. The mitral valve is grossly normal. No evidence of mitral valve  regurgitation.   4. The aortic valve is tricuspid. Aortic valve regurgitation is mild.  Mild aortic valve sclerosis is present, with no evidence of aortic valve  stenosis.   5. The inferior vena cava is normal in size with greater than 50%  respiratory variability, suggesting right atrial pressure of 3 mmHg.   EKG:   09/20/2020: Sinus rhythm. Rate 91 bpm. LVH.  Recent Labs: No results found for requested labs within last 8760 hours.   Recent Lipid Panel    Component Value Date/Time   CHOL 207 (H) 07/18/2019 1327   TRIG 189 (H) 07/18/2019 1327   HDL 53 07/18/2019 1327   CHOLHDL 3.9 07/18/2019 1327   VLDL 24 12/23/2015 1250   LDLCALC 124 (H) 07/18/2019 1327    Physical Exam:  VS:  BP (!) 246/140 (BP Location: Left Arm, Patient Position: Sitting, Cuff Size: Normal)   Pulse 91   Ht '5\' 6"'$  (1.676 m)   Wt 164 lb 8 oz (74.6 kg)   BMI 26.55 kg/m  , BMI Body mass index is 26.55 kg/m. GENERAL:  Well appearing HEENT: Pupils equal round and reactive, fundi not visualized, oral mucosa unremarkable NECK:  No jugular venous distention, waveform within normal limits, carotid upstroke brisk and symmetric, no bruits, no thyromegaly LUNGS:  Clear to auscultation bilaterally HEART:  RRR.  PMI not displaced or sustained,S1 and S2 within normal limits, no S3, no S4, no clicks, no rubs, no murmurs ABD:  Flat, positive bowel sounds normal in frequency in pitch, no bruits, no rebound, no guarding, no midline pulsatile mass, no hepatomegaly, no splenomegaly EXT:  2 plus pulses throughout, no edema, no cyanosis no clubbing SKIN:  No rashes no nodules NEURO:  Cranial nerves II through XII grossly intact, motor grossly intact throughout PSYCH:  Cognitively  intact, oriented to person place and time   ASSESSMENT/PLAN:    Malignant hypertension Blood pressure is extremely elevated in [place] but seems to be much lower at home.  She reports bring her machine to the office and it was accurate.  She has been intolerant of many medications.  We will have her wear a 24-hour ambulatory blood pressure monitor to get a better sense of her true blood pressure.  She struggles with diuretics due to cramping.  Taking that she has not tolerated amlodipine, lisinopril, or labetalol.  Would consider doxazosin or clonidine should she truly have essential hypertension.  Hyperlipidemia LDL goal <100 Lipids are poorly controlled.  Her ASCVD 10-year risk is 16.6%.  She is active, though she does not get much formal exercise.  It seems that her diet has room for improvement.  We discussed limiting her fried foods, fatty foods, red meat, and cheese.  She is going to work on not eating out so much.  We will repeat fasting lipids and a CMP in 3 months.   Screening for Secondary Hypertension:  Causes 09/20/2020  Drugs/Herbals Screened     - Comments salt  Sleep Apnea Screened  Thyroid Disease Screened  Hyperparathyroidism N/A  Coarctation of the Aorta Screened    Relevant Labs/Studies: Basic Labs Latest Ref Rng & Units 07/18/2019 03/18/2019 07/23/2018  Sodium 135 - 146 mmol/L 139 142 141  Potassium 3.5 - 5.3 mmol/L 5.0 4.0 3.9  Creatinine 0.60 - 0.93 mg/dL 1.45(H) 1.17(H) 0.83    Thyroid  Latest Ref Rng & Units 07/18/2019 07/23/2018  TSH 0.40 - 4.50 mIU/L 1.93 1.85                 Disposition:    FU with PharmD in 1 month.   FU with Bergen Melle C. Oval Linsey, MD, [facility] in 4 months.   Medication Adjustments/Labs and Tests Ordered: Current medicines are reviewed at length with the patient today.  Concerns regarding medicines are outlined above.   Orders Placed This Encounter  Procedures   24 hour blood pressure monitor   EKG 12-Lead    No orders of the defined  types were placed in this encounter.   I,Mathew Stumpf,acting as a Education administrator for Skeet Latch, MD.,have documented all relevant documentation on the behalf of Skeet Latch, MD,as directed by  Skeet Latch, MD while in the presence of Skeet Latch, MD.  I, [place] Oval Linsey, MD have reviewed all documentation for this visit.  The documentation of  the exam, diagnosis, procedures, and orders on 09/20/2020 are all accurate and complete.   Waynetta Pean  09/20/2020 9:32 AM    [facility]

## 2020-09-20 ENCOUNTER — Other Ambulatory Visit: Payer: Self-pay

## 2020-09-20 ENCOUNTER — Encounter (HOSPITAL_BASED_OUTPATIENT_CLINIC_OR_DEPARTMENT_OTHER): Payer: Self-pay | Admitting: Cardiovascular Disease

## 2020-09-20 ENCOUNTER — Ambulatory Visit (HOSPITAL_BASED_OUTPATIENT_CLINIC_OR_DEPARTMENT_OTHER): Payer: Medicare HMO | Admitting: Cardiovascular Disease

## 2020-09-20 DIAGNOSIS — I10 Essential (primary) hypertension: Secondary | ICD-10-CM | POA: Diagnosis not present

## 2020-09-20 DIAGNOSIS — E785 Hyperlipidemia, unspecified: Secondary | ICD-10-CM | POA: Diagnosis not present

## 2020-09-20 NOTE — Assessment & Plan Note (Signed)
Blood pressure is extremely elevated in clinic but seems to be much lower at home.  She reports bring her machine to the office and it was accurate.  She has been intolerant of many medications.  We will have her wear a 24-hour ambulatory blood pressure monitor to get a better sense of her true blood pressure.  She struggles with diuretics due to cramping.  Taking that she has not tolerated amlodipine, lisinopril, or labetalol.  Would consider doxazosin or clonidine should she truly have essential hypertension.

## 2020-09-20 NOTE — Patient Instructions (Addendum)
Medication Instructions:  Your physician recommends that you continue on your current medications as directed. Please refer to the Current Medication list given to you today.    Labwork: NONE   Testing/Procedures: 24 HOUR HOME BLOOD PRESSURE MONITOR     Follow-Up: 10/20/2020 11:30 AM WITH PHARM D AT [facility] OFFICE   Special Instructions:   LIMIT YOUR FRIED/FATTY FOODS, CHEESE, AND RED MEAT   MONITOR YOUR BLOOD PRESSURE TWICE A DAY, LOG IN THE BOOK PROVIDED. BRING THE BOOK AND YOUR BLOOD PRESSURE MACHINE TO YOUR FOLLOW UP IN 1 MONTH    DASH Eating Plan DASH stands for "Dietary Approaches to Stop Hypertension." The DASH eating plan is a healthy eating plan that has been shown to reduce high blood pressure (hypertension). It may also reduce your risk for type 2 diabetes, heart disease, and stroke. The DASH eating plan may also help with weight loss. What are tips for following this plan?  General guidelines Avoid eating more than 2,300 mg (milligrams) of salt (sodium) a day. If you have hypertension, you may need to reduce your sodium intake to 1,500 mg a day. Limit alcohol intake to no more than 1 drink a day for nonpregnant women and 2 drinks a day for men. One drink equals 12 oz of beer, 5 oz of wine, or 1 oz of hard liquor. Work with your health care provider to maintain a healthy body weight or to lose weight. Ask what an ideal weight is for you. Get at least 30 minutes of exercise that causes your heart to beat faster (aerobic exercise) most days of the week. Activities may include walking, swimming, or biking. Work with your health care provider or diet and nutrition specialist (dietitian) to adjust your eating plan to your individual calorie needs. Reading food labels  Check food labels for the amount of sodium per serving. Choose foods with less than 5 percent of the Daily Value of sodium. Generally, foods with less than 300 mg of sodium per serving fit into this eating  plan. To find whole grains, look for the word "whole" as the first word in the ingredient list. Shopping Buy products labeled as "low-sodium" or "no salt added." Buy fresh foods. Avoid canned foods and premade or frozen meals. Cooking Avoid adding salt when cooking. Use salt-free seasonings or herbs instead of table salt or sea salt. Check with your health care provider or pharmacist before using salt substitutes. Do not fry foods. Cook foods using healthy methods such as baking, boiling, grilling, and broiling instead. Cook with heart-healthy oils, such as olive, canola, soybean, or sunflower oil. Meal planning Eat a balanced diet that includes: 5 or more servings of fruits and vegetables each day. At each meal, try to fill half of your plate with fruits and vegetables. Up to 6-8 servings of whole grains each day. Less than 6 oz of lean meat, poultry, or fish each day. A 3-oz serving of meat is about the same size as a deck of cards. One egg equals 1 oz. 2 servings of low-fat dairy each day. A serving of nuts, seeds, or beans 5 times each week. Heart-healthy fats. Healthy fats called Omega-3 fatty acids are found in foods such as flaxseeds and coldwater fish, like sardines, salmon, and mackerel. Limit how much you eat of the following: Canned or prepackaged foods. Food that is high in trans fat, such as fried foods. Food that is high in saturated fat, such as fatty meat. Sweets, desserts, sugary drinks, and other  foods with added sugar. Full-fat dairy products. Do not salt foods before eating. Try to eat at least 2 vegetarian meals each week. Eat more home-cooked food and less restaurant, buffet, and fast food. When eating at a restaurant, ask that your food be prepared with less salt or no salt, if possible. What foods are recommended? The items listed may not be a complete list. Talk with your dietitian about what dietary choices are best for you. Grains Whole-grain or whole-wheat  bread. Whole-grain or whole-wheat pasta. Brown rice. [place]row. Bulgur. Whole-grain and low-sodium cereals. Pita bread. Low-fat, low-sodium crackers. Whole-wheat flour tortillas. Vegetables Fresh or frozen vegetables (raw, steamed, roasted, or grilled). Low-sodium or reduced-sodium tomato and vegetable juice. Low-sodium or reduced-sodium tomato sauce and tomato paste. Low-sodium or reduced-sodium canned vegetables. Fruits All fresh, dried, or frozen fruit. Canned fruit in natural juice (without added sugar). Meat and other protein foods Skinless chicken or [place]. Ground chicken or [place]. Pork with fat trimmed off. Fish and seafood. Egg whites. Dried beans, peas, or lentils. Unsalted nuts, nut butters, and seeds. Unsalted canned beans. Lean cuts of beef with fat trimmed off. Low-sodium, lean deli meat. Dairy Low-fat (1%) or fat-free (skim) milk. Fat-free, low-fat, or reduced-fat cheeses. Nonfat, low-sodium ricotta or cottage cheese. Low-fat or nonfat yogurt. Low-fat, low-sodium cheese. Fats and oils Soft margarine without trans fats. Vegetable oil. Low-fat, reduced-fat, or light mayonnaise and salad dressings (reduced-sodium). Canola, safflower, olive, soybean, and sunflower oils. Avocado. Seasoning and other foods Herbs. Spices. Seasoning mixes without salt. Unsalted popcorn and pretzels. Fat-free sweets. What foods are not recommended? The items listed may not be a complete list. Talk with your dietitian about what dietary choices are best for you. Grains Baked goods made with fat, such as croissants, muffins, or some breads. Dry pasta or rice meal packs. Vegetables Creamed or fried vegetables. Vegetables in a cheese sauce. Regular canned vegetables (not low-sodium or reduced-sodium). Regular canned tomato sauce and paste (not low-sodium or reduced-sodium). Regular tomato and vegetable juice (not low-sodium or reduced-sodium). Angie Fava. Olives. Fruits Canned fruit in a light or heavy  syrup. Fried fruit. Fruit in cream or butter sauce. Meat and other protein foods Fatty cuts of meat. Ribs. Fried meat. Berniece Salines. Sausage. Bologna and other processed lunch meats. Salami. Fatback. Hotdogs. Bratwurst. Salted nuts and seeds. Canned beans with added salt. Canned or smoked fish. Whole eggs or egg yolks. Chicken or [place] with skin. Dairy Whole or 2% milk, cream, and half-and-half. Whole or full-fat cream cheese. Whole-fat or sweetened yogurt. Full-fat cheese. Nondairy creamers. Whipped toppings. Processed cheese and cheese spreads. Fats and oils Butter. Stick margarine. Lard. Shortening. Ghee. Bacon fat. Tropical oils, such as coconut, palm kernel, or palm oil. Seasoning and other foods Salted popcorn and pretzels. Onion salt, garlic salt, seasoned salt, table salt, and sea salt. Worcestershire sauce. Tartar sauce. Barbecue sauce. Teriyaki sauce. Soy sauce, including reduced-sodium. Steak sauce. Canned and packaged gravies. Fish sauce. Oyster sauce. Cocktail sauce. Horseradish that you find on the shelf. Ketchup. Mustard. Meat flavorings and tenderizers. Bouillon cubes. Hot sauce and Tabasco sauce. Premade or packaged marinades. Premade or packaged taco seasonings. Relishes. Regular salad dressings. Where to find more information: National Heart, Lung, and [place]: https://wilson-eaton.com/ American Heart Association: www.heart.org Summary The DASH eating plan is a healthy eating plan that has been shown to reduce high blood pressure (hypertension). It may also reduce your risk for type 2 diabetes, heart disease, and stroke. With the DASH eating plan, you should limit salt (sodium)  intake to 2,300 mg a day. If you have hypertension, you may need to reduce your sodium intake to 1,500 mg a day. When on the DASH eating plan, aim to eat more fresh fruits and vegetables, whole grains, lean proteins, low-fat dairy, and heart-healthy fats. Work with your health care provider or diet and nutrition  specialist (dietitian) to adjust your eating plan to your individual calorie needs. This information is not intended to replace advice given to you by your health care provider. Make sure you discuss any questions you have with your health care provider. Document Released: 12/08/2010 Document Revised: 12/01/2016 Document Reviewed: 12/13/2015 Elsevier Patient Education  2020 [employer].

## 2020-09-20 NOTE — Assessment & Plan Note (Signed)
Lipids are poorly controlled.  Her ASCVD 10-year risk is 16.6%.  She is active, though she does not get much formal exercise.  It seems that her diet has room for improvement.  We discussed limiting her fried foods, fatty foods, red meat, and cheese.  She is going to work on not eating out so much.  We will repeat fasting lipids and a CMP in 3 months.

## 2020-10-20 ENCOUNTER — Ambulatory Visit: Payer: Medicare HMO

## 2020-10-20 NOTE — Progress Notes (Deleted)
Patient ID: ADAN BEAL                 DOB: 1945-03-12                      MRN: [id number]     HPI: ANNLEE GLANDON is a 75 y.o. female referred by Dr. Oval Linsey to HTN clinic. PMH is significant for anemia, malignant HTN, and HLD  Current HTN meds: N/A  Previously tried:  Chlorthalidone Amlodipine Hydralazine Maxzide Lisinopril Spironolactone HCTZ Benazapril Carvedilol Clonidine Losartan Olmesartan Verapamil  BP goal:   Family History:   Social History:   Diet:   Exercise:   Home BP readings:   Wt Readings from Last 3 Encounters:  09/20/20 164 lb 8 oz (74.6 kg)  07/20/20 164 lb (74.4 kg)  07/13/20 162 lb (73.5 kg)   BP Readings from Last 3 Encounters:  09/20/20 (!) 246/140  07/20/20 (!) 227/121  07/13/20 (!) 200/110   Pulse Readings from Last 3 Encounters:  09/20/20 91  07/20/20 87  07/13/20 89    Renal function: CrCl cannot be calculated (Patient's most recent lab result is older than the maximum 21 days allowed.).  Past Medical History:  Diagnosis Date   Abnormal electrocardiogram 02/03/2010   Sinus bradycardia   Anemia    Hyperlipidemia 11/04/2010   Hypertension 1985   Never successfully treated.   Medication intolerance    Tinnitus of both ears     Current Outpatient Medications on File Prior to Visit  Medication Sig Dispense Refill   aspirin 81 MG EC tablet Take 81 mg by mouth daily.     calcium-vitamin D (OSCAL WITH D) 500-200 MG-UNIT tablet Take 1 tablet by mouth 2 (two) times daily. (Patient taking differently: Take 1 tablet by mouth daily.) 60 tablet 11   Multiple Vitamins-Minerals (WOMENS MULTIVITAMIN PLUS) TABS Take 1 tablet by mouth daily.     No current facility-administered medications on file prior to visit.    Allergies  Allergen Reactions   Chlorthalidone Other (See Comments)    States medication is drunk and dizzy   Amlodipine     All over cramps   Hydralazine Other (See Comments)   Hydrochlorothiazide  W-Triamterene     REACTION: cramps   Lisinopril     Dizzy and leg cramps    Spironolactone Other (See Comments)    muscle cramps     Assessment/Plan:  1. Hypertension -

## 2020-10-28 ENCOUNTER — Telehealth (HOSPITAL_BASED_OUTPATIENT_CLINIC_OR_DEPARTMENT_OTHER): Payer: Self-pay | Admitting: *Deleted

## 2020-10-28 NOTE — Telephone Encounter (Signed)
-----   Message from Rollen Sox, [facility] sent at 10/22/2020  3:30 PM EDT ----- Sara Haynes, was patient scheduled for BP monitor on 10/26.  Called and canceled appt. Did not want to reschedule at this time

## 2020-11-09 ENCOUNTER — Ambulatory Visit: Payer: Medicare HMO

## 2020-11-09 ENCOUNTER — Telehealth: Payer: Self-pay

## 2020-11-09 NOTE — Progress Notes (Deleted)
     11/09/2020 Sara Haynes February 05, 1945 [id number]   HPI:  Sara Haynes is a 75 y.o. female patient of Dr ***, with a PMH below who presents today for hypertension clinic evaluation.  Past Medical History:                   Blood Pressure Goal:  130/80  Current Medications:  Family Hx:  Social Hx:   Diet:   Exercise:   Home BP readings:   Intolerances:   Labs:    Wt Readings from Last 3 Encounters:  09/20/20 164 lb 8 oz (74.6 kg)  07/20/20 164 lb (74.4 kg)  07/13/20 162 lb (73.5 kg)   BP Readings from Last 3 Encounters:  09/20/20 (!) 246/140  07/20/20 (!) 227/121  07/13/20 (!) 200/110   Pulse Readings from Last 3 Encounters:  09/20/20 91  07/20/20 87  07/13/20 89    Current Outpatient Medications  Medication Sig Dispense Refill   aspirin 81 MG EC tablet Take 81 mg by mouth daily.     calcium-vitamin D (OSCAL WITH D) 500-200 MG-UNIT tablet Take 1 tablet by mouth 2 (two) times daily. (Patient taking differently: Take 1 tablet by mouth daily.) 60 tablet 11   Multiple Vitamins-Minerals (WOMENS MULTIVITAMIN PLUS) TABS Take 1 tablet by mouth daily.     No current facility-administered medications for this visit.    Allergies  Allergen Reactions   Chlorthalidone Other (See Comments)    States medication is drunk and dizzy   Amlodipine     All over cramps   Hydralazine Other (See Comments)   Hydrochlorothiazide W-Triamterene     REACTION: cramps   Lisinopril     Dizzy and leg cramps    Spironolactone Other (See Comments)    muscle cramps    Past Medical History:  Diagnosis Date   Abnormal electrocardiogram 02/03/2010   Sinus bradycardia   Anemia    Hyperlipidemia 11/04/2010   Hypertension 1985   Never successfully treated.   Medication intolerance    Tinnitus of both ears     There were no vitals taken for this visit.  No problem-specific Assessment & Plan notes found for this encounter.   Tommy Medal PharmD CPP  [facility] [address] [place], [place] [address] [phone]

## 2020-11-09 NOTE — Telephone Encounter (Signed)
Unable to lmom to r/s missed appt as her mail box wasn't set up

## 2021-01-25 DIAGNOSIS — Z6825 Body mass index (BMI) 25.0-25.9, adult: Secondary | ICD-10-CM | POA: Diagnosis not present

## 2021-01-25 DIAGNOSIS — Z9114 Patient's other noncompliance with medication regimen: Secondary | ICD-10-CM | POA: Diagnosis not present

## 2021-01-25 DIAGNOSIS — Z0001 Encounter for general adult medical examination with abnormal findings: Secondary | ICD-10-CM | POA: Diagnosis not present

## 2021-01-25 DIAGNOSIS — E7849 Other hyperlipidemia: Secondary | ICD-10-CM | POA: Diagnosis not present

## 2021-01-25 DIAGNOSIS — I160 Hypertensive urgency: Secondary | ICD-10-CM | POA: Diagnosis not present

## 2021-01-25 DIAGNOSIS — E663 Overweight: Secondary | ICD-10-CM | POA: Diagnosis not present

## 2021-01-25 DIAGNOSIS — Z713 Dietary counseling and surveillance: Secondary | ICD-10-CM | POA: Diagnosis not present

## 2021-02-28 DIAGNOSIS — E7849 Other hyperlipidemia: Secondary | ICD-10-CM | POA: Diagnosis not present

## 2021-02-28 DIAGNOSIS — I160 Hypertensive urgency: Secondary | ICD-10-CM | POA: Diagnosis not present

## 2021-02-28 DIAGNOSIS — Z130 Encounter for screening for diseases of the blood and blood-forming organs and certain disorders involving the immune mechanism: Secondary | ICD-10-CM | POA: Diagnosis not present

## 2021-02-28 DIAGNOSIS — E663 Overweight: Secondary | ICD-10-CM | POA: Diagnosis not present

## 2021-02-28 DIAGNOSIS — E559 Vitamin D deficiency, unspecified: Secondary | ICD-10-CM | POA: Diagnosis not present

## 2021-03-08 DIAGNOSIS — D649 Anemia, unspecified: Secondary | ICD-10-CM | POA: Diagnosis not present

## 2021-03-08 DIAGNOSIS — E559 Vitamin D deficiency, unspecified: Secondary | ICD-10-CM | POA: Diagnosis not present

## 2021-03-08 DIAGNOSIS — N1831 Chronic kidney disease, stage 3a: Secondary | ICD-10-CM | POA: Diagnosis not present

## 2021-03-08 DIAGNOSIS — I129 Hypertensive chronic kidney disease with stage 1 through stage 4 chronic kidney disease, or unspecified chronic kidney disease: Secondary | ICD-10-CM | POA: Diagnosis not present

## 2021-03-08 IMAGING — MG DIGITAL SCREENING BILATERAL MAMMOGRAM WITH TOMO AND CAD
8 of 14 series · 8 of 40 positions shown · non-contrast
Comparison: Previous exam(s).

CLINICAL DATA: Screening.

EXAM:
DIGITAL SCREENING BILATERAL MAMMOGRAM WITH TOMO AND CAD

[R MLO synth-2D]
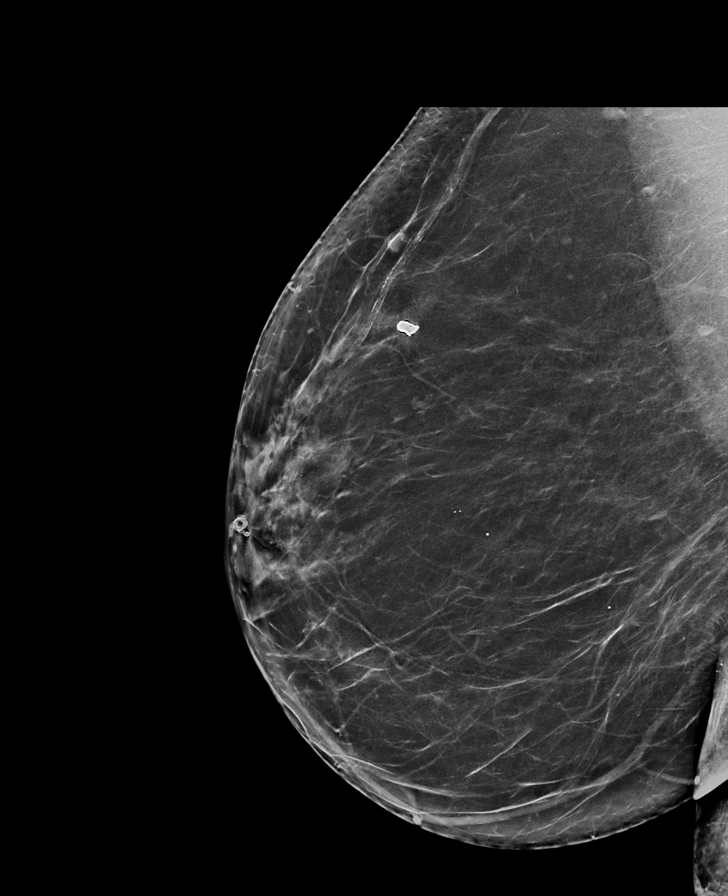

[L XCCL synth-2D]
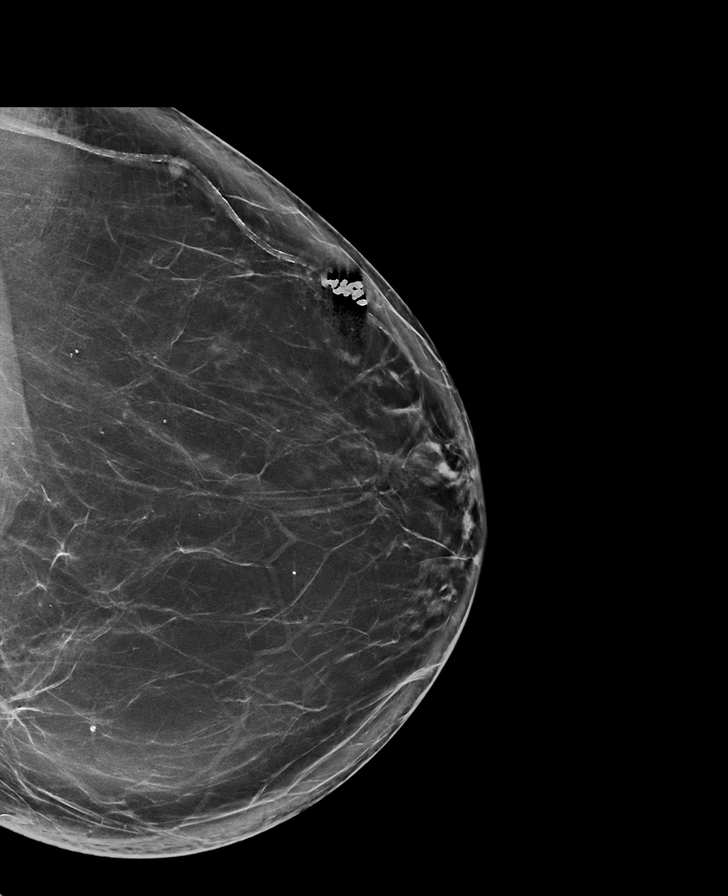

[R CC synth-2D (1 of 2)]
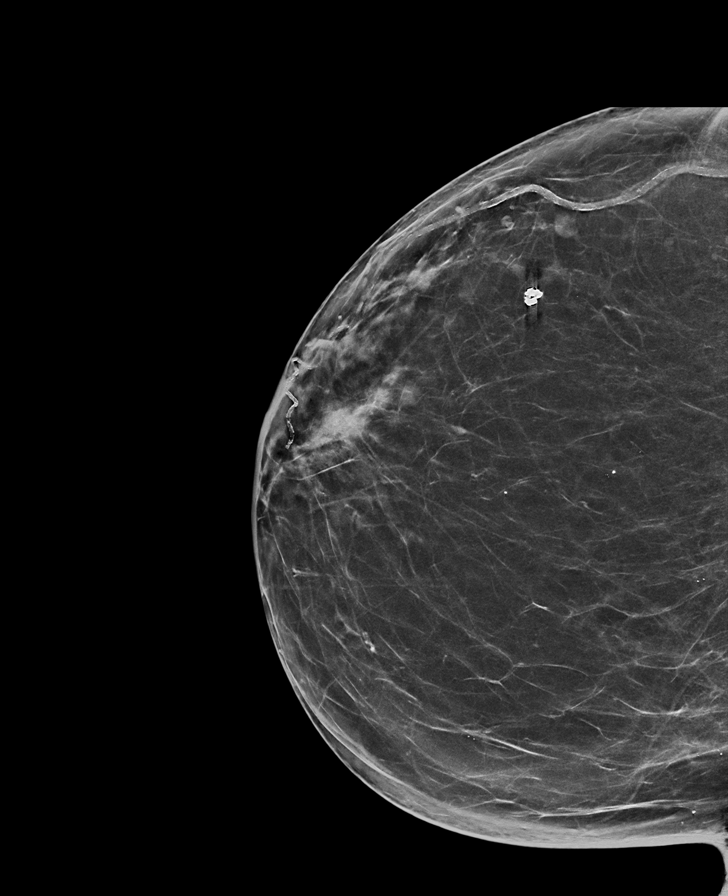

[R CC synth-2D (2 of 2)]
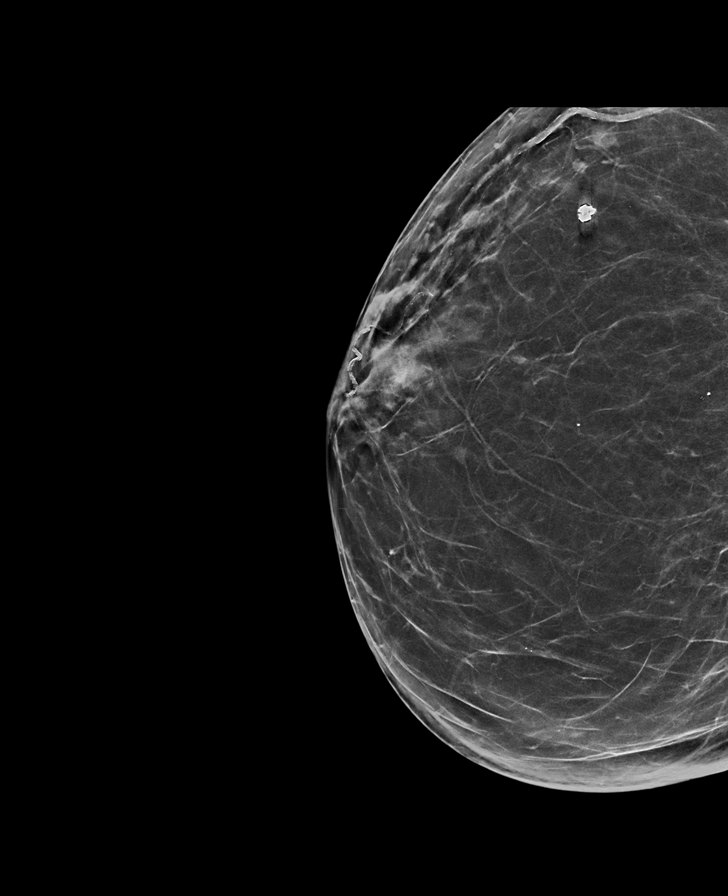

[L MLO synth-2D]
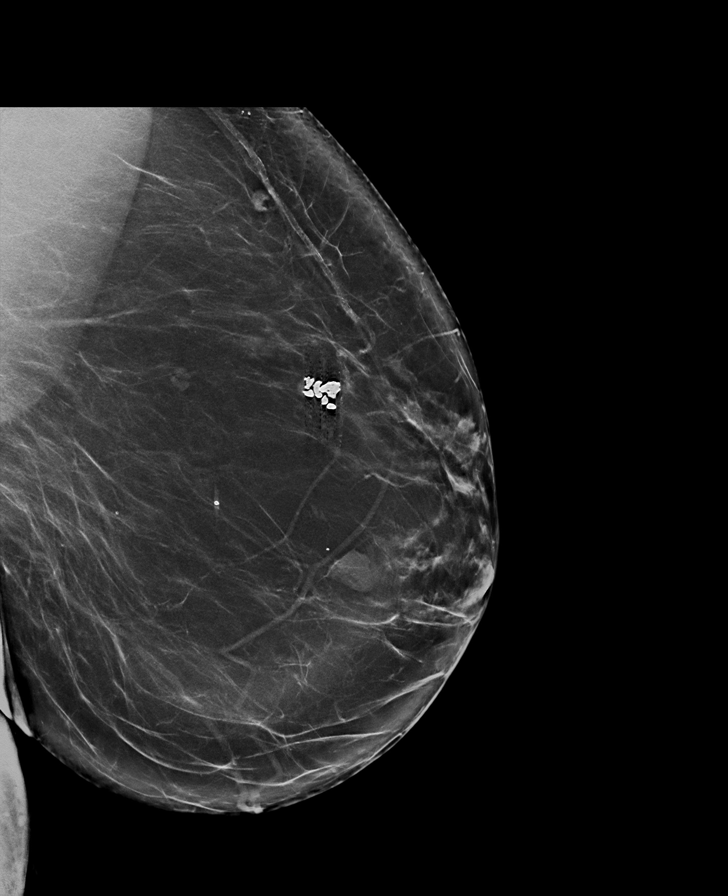

[L CC synth-2D (1 of 2)]
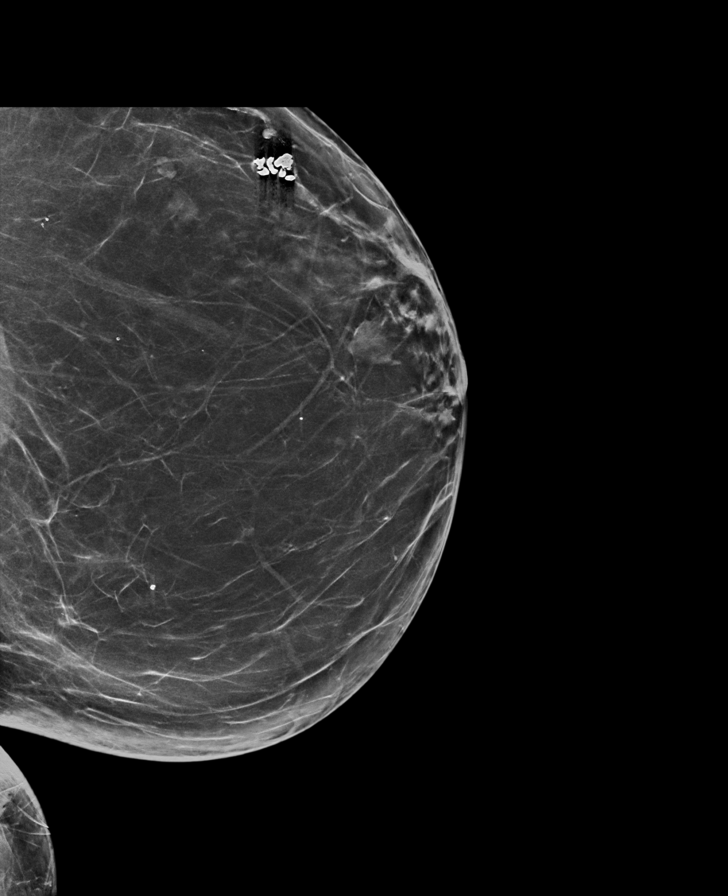

[L CC synth-2D (2 of 2)]
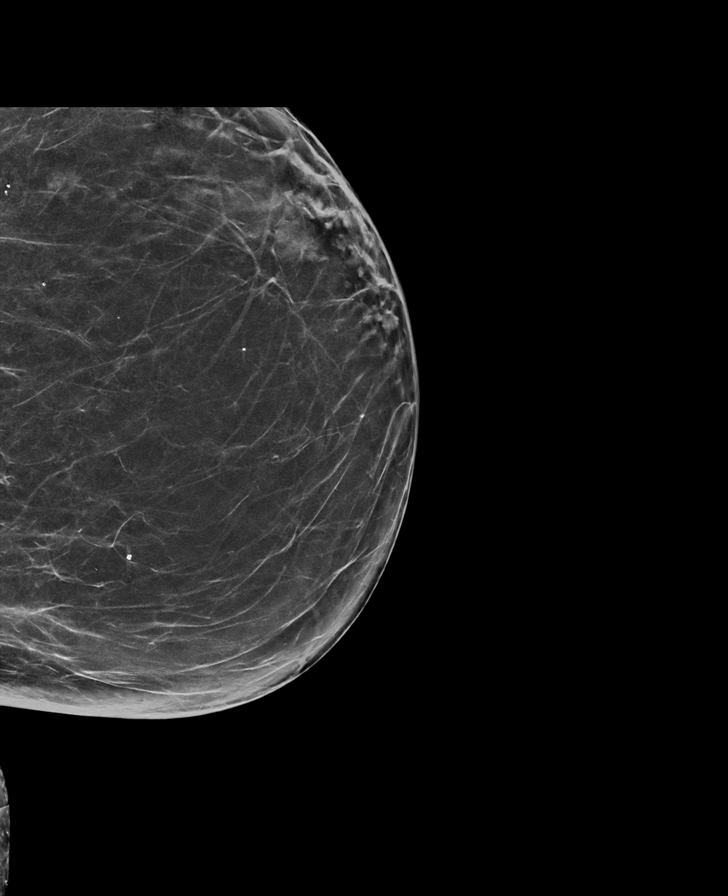

[L XCCL tomo · tomo slice 45/90.0]
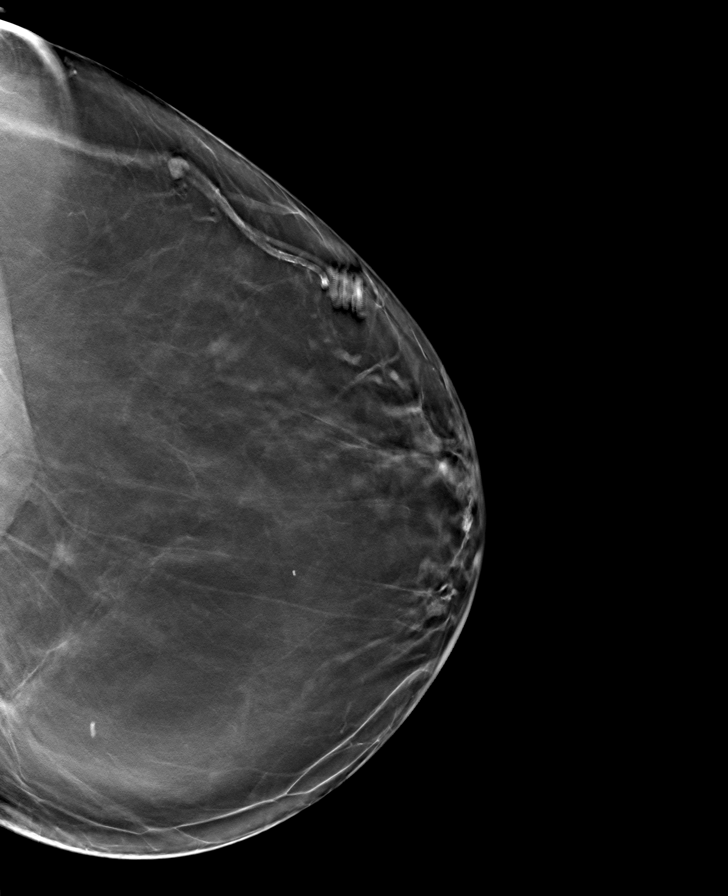

[8 of 40 positions shown; findings below may reference images not displayed]

ACR Breast Density Category b: There are scattered areas of
fibroglandular density.
FINDINGS: There are no findings suspicious for malignancy. Images were
processed with CAD.
IMPRESSION: No mammographic evidence of malignancy. A result letter of this
screening mammogram will be mailed directly to the patient.

RECOMMENDATION:
Screening mammogram in one year. (Code:[id number]-775)

BI-RADS CATEGORY  1: Negative.

## 2021-04-15 DIAGNOSIS — Z825 Family history of asthma and other chronic lower respiratory diseases: Secondary | ICD-10-CM | POA: Diagnosis not present

## 2021-04-15 DIAGNOSIS — Z803 Family history of malignant neoplasm of breast: Secondary | ICD-10-CM | POA: Diagnosis not present

## 2021-04-15 DIAGNOSIS — Z8249 Family history of ischemic heart disease and other diseases of the circulatory system: Secondary | ICD-10-CM | POA: Diagnosis not present

## 2021-04-15 DIAGNOSIS — Z6828 Body mass index (BMI) 28.0-28.9, adult: Secondary | ICD-10-CM | POA: Diagnosis not present

## 2021-04-15 DIAGNOSIS — Z008 Encounter for other general examination: Secondary | ICD-10-CM | POA: Diagnosis not present

## 2021-04-15 DIAGNOSIS — I10 Essential (primary) hypertension: Secondary | ICD-10-CM | POA: Diagnosis not present

## 2021-04-15 DIAGNOSIS — Z833 Family history of diabetes mellitus: Secondary | ICD-10-CM | POA: Diagnosis not present

## 2021-04-15 DIAGNOSIS — E663 Overweight: Secondary | ICD-10-CM | POA: Diagnosis not present

## 2021-08-26 ENCOUNTER — Encounter (INDEPENDENT_AMBULATORY_CARE_PROVIDER_SITE_OTHER): Payer: Self-pay | Admitting: *Deleted

## 2021-09-14 DIAGNOSIS — R7301 Impaired fasting glucose: Secondary | ICD-10-CM | POA: Diagnosis not present

## 2021-09-14 DIAGNOSIS — Z23 Encounter for immunization: Secondary | ICD-10-CM | POA: Diagnosis not present

## 2021-09-14 DIAGNOSIS — I10 Essential (primary) hypertension: Secondary | ICD-10-CM | POA: Diagnosis not present

## 2021-09-14 DIAGNOSIS — N183 Chronic kidney disease, stage 3 unspecified: Secondary | ICD-10-CM | POA: Diagnosis not present

## 2021-09-14 DIAGNOSIS — E663 Overweight: Secondary | ICD-10-CM | POA: Diagnosis not present

## 2021-09-14 DIAGNOSIS — Z0189 Encounter for other specified special examinations: Secondary | ICD-10-CM | POA: Diagnosis not present

## 2021-09-14 DIAGNOSIS — N814 Uterovaginal prolapse, unspecified: Secondary | ICD-10-CM | POA: Diagnosis not present

## 2021-09-14 DIAGNOSIS — Z6826 Body mass index (BMI) 26.0-26.9, adult: Secondary | ICD-10-CM | POA: Diagnosis not present

## 2021-09-28 DIAGNOSIS — I10 Essential (primary) hypertension: Secondary | ICD-10-CM | POA: Diagnosis not present

## 2021-10-04 ENCOUNTER — Other Ambulatory Visit (HOSPITAL_COMMUNITY): Payer: Self-pay | Admitting: Family Medicine

## 2021-10-04 DIAGNOSIS — I10 Essential (primary) hypertension: Secondary | ICD-10-CM | POA: Diagnosis not present

## 2021-10-04 DIAGNOSIS — E663 Overweight: Secondary | ICD-10-CM | POA: Diagnosis not present

## 2021-10-04 DIAGNOSIS — D649 Anemia, unspecified: Secondary | ICD-10-CM | POA: Diagnosis not present

## 2021-10-04 DIAGNOSIS — Z1231 Encounter for screening mammogram for malignant neoplasm of breast: Secondary | ICD-10-CM | POA: Diagnosis not present

## 2021-10-04 DIAGNOSIS — N814 Uterovaginal prolapse, unspecified: Secondary | ICD-10-CM | POA: Diagnosis not present

## 2021-10-04 DIAGNOSIS — R7301 Impaired fasting glucose: Secondary | ICD-10-CM | POA: Diagnosis not present

## 2021-10-04 DIAGNOSIS — D696 Thrombocytopenia, unspecified: Secondary | ICD-10-CM | POA: Diagnosis not present

## 2021-10-04 DIAGNOSIS — Z13820 Encounter for screening for osteoporosis: Secondary | ICD-10-CM | POA: Diagnosis not present

## 2021-10-04 DIAGNOSIS — Z1211 Encounter for screening for malignant neoplasm of colon: Secondary | ICD-10-CM | POA: Diagnosis not present

## 2021-10-04 DIAGNOSIS — N183 Chronic kidney disease, stage 3 unspecified: Secondary | ICD-10-CM | POA: Diagnosis not present

## 2021-10-04 DIAGNOSIS — Z6825 Body mass index (BMI) 25.0-25.9, adult: Secondary | ICD-10-CM | POA: Diagnosis not present

## 2021-10-06 ENCOUNTER — Ambulatory Visit (INDEPENDENT_AMBULATORY_CARE_PROVIDER_SITE_OTHER): Payer: Medicare HMO | Admitting: Obstetrics & Gynecology

## 2021-10-06 ENCOUNTER — Encounter: Payer: Self-pay | Admitting: Obstetrics & Gynecology

## 2021-10-06 VITALS — BP 223/104 | HR 91 | Ht 66.0 in | Wt 158.0 lb

## 2021-10-06 DIAGNOSIS — N952 Postmenopausal atrophic vaginitis: Secondary | ICD-10-CM

## 2021-10-06 DIAGNOSIS — I10 Essential (primary) hypertension: Secondary | ICD-10-CM | POA: Diagnosis not present

## 2021-10-06 DIAGNOSIS — N8110 Cystocele, unspecified: Secondary | ICD-10-CM | POA: Diagnosis not present

## 2021-10-06 NOTE — Progress Notes (Signed)
GYN VISIT Patient name: Sara Haynes MRN [id number]  Date of birth: 06-14-1945 Chief Complaint:   Bladder Prolapse  History of Present Illness:   Sara Haynes is a 76 y.o. [phone] female being seen today for the following concerns:  Prolapse: She notes that this has been present for about 4yr and feels like she is sitting on a ball.  It has become very uncomfortable and worse.  Denies vaginal bleeding, itching or irritation Not sexually active.  Denies urinary frequency, occasional incomplete voiding.  Denies nocturia.  +Urge incontinence on occasion if she holds her urine for too long, denies stress incontinence.  No other acute complaints.  BP elevated today- she reports this is "normal" for her everytime she goes in to the doctor.  Records reviewed BP 200/120 at her PCP visit. She reports that it goes back down when she is not in the office.  No LMP recorded. Patient is postmenopausal.     07/09/2019    1:12 PM 05/02/2019   11:09 AM 08/20/2018    1:57 PM 07/25/2018   10:43 AM 07/23/2018    9:19 AM  Depression screen PHQ 2/9  Decreased Interest 0 0 0 0 0  Down, Depressed, Hopeless 0 0 0 0 0  PHQ - 2 Score 0 0 0 0 0  Altered sleeping  0     Tired, decreased energy  0     Change in appetite  0     Feeling bad or failure about yourself   0     Trouble concentrating  0     Moving slowly or fidgety/restless  0     Suicidal thoughts  0     PHQ-9 Score  0     Difficult doing work/chores  Not difficult at all        Review of Systems:   Pertinent items are noted in HPI Denies fever/chills, dizziness, headaches, visual disturbances, fatigue, shortness of breath, chest pain, abdominal pain, vomiting, no problems with bowel movements, urination, or intercourse unless otherwise stated above.  Pertinent History Reviewed:  Reviewed past medical,surgical, social, obstetrical and family history.  Reviewed problem list, medications and allergies. Physical Assessment:    Vitals:   10/06/21 1034  BP: (!) 223/104  Pulse: 91  Weight: 158 lb (71.7 kg)  Height: '5\' 6"'$  (1.676 m)  Body mass index is 25.5 kg/m.       Physical Examination:   General appearance: alert, well appearing, and in no distress  Psych: mood appropriate, normal affect  Skin: warm & dry   Cardiovascular: normal heart rate noted  Respiratory: normal respiratory effort, no distress  Abdomen: soft, non-tender, no rebound, no guarding  Pelvic: visible cystocele extending past introitus on initial exam- easily reducible.  Normal external genitalia, vagina- pink flat mucosa- no abnormalities noted.  Cervix difficult to visualize due to large cystocele.  On bimanual exam- no masses or tenderness appreciated  Extremities: no edema, no calf tenderness bilaterally  Chaperone: ACelene Squibb   PESSARY FITTING -Initially pt fitted with Milex ring #3, it immediately fell out when she voided -Pt fitted with Gelhorn due to degree of prolapse -fitting completed without issues- pt denies pain, discomfort or slippage -sample pessary taken out and fitted with Gelhorn#4 without difficulty -pt tolerated above without issues  Assessment & Plan:  1) Stage 3 cystocele -reviewed findings on exam and discussed management options from pessary to surgical intervention -pessary fitting completed today without difficulty -Gelhorn #4 placed and  given today -f/u in 4 wks  2) Elevated BP -pt followed by PCP and as stated above reports this being a normal occurrence for her -currently asymptomatic   Return in about 4 weeks (around 11/03/2021) for Pessary follow up- Coretha Creswell.   Janyth Pupa, DO Attending [place], [facility] for [employer], [place]

## 2021-10-10 DIAGNOSIS — Z1211 Encounter for screening for malignant neoplasm of colon: Secondary | ICD-10-CM | POA: Diagnosis not present

## 2021-10-12 ENCOUNTER — Ambulatory Visit (HOSPITAL_COMMUNITY)
Admission: RE | Admit: 2021-10-12 | Discharge: 2021-10-12 | Disposition: A | Discharge status: Home / Self Care | Payer: Medicare HMO | Source: Ambulatory Visit | Attending: Family Medicine | Admitting: Family Medicine

## 2021-10-12 DIAGNOSIS — Z1231 Encounter for screening mammogram for malignant neoplasm of breast: Secondary | ICD-10-CM | POA: Diagnosis not present

## 2021-10-25 ENCOUNTER — Encounter: Payer: Self-pay | Admitting: Internal Medicine

## 2021-10-25 ENCOUNTER — Ambulatory Visit: Payer: Medicare HMO | Attending: Internal Medicine | Admitting: Internal Medicine

## 2021-10-25 VITALS — BP 240/110 | HR 78 | Ht 66.0 in | Wt 160.0 lb

## 2021-10-25 DIAGNOSIS — I10 Essential (primary) hypertension: Secondary | ICD-10-CM | POA: Diagnosis not present

## 2021-10-25 MED ORDER — ATORVASTATIN CALCIUM 20 MG PO TABS: 20.0000 mg | ORAL_TABLET | Freq: Every day | ORAL | 3 refills | Status: DC

## 2021-10-25 MED ORDER — ATORVASTATIN CALCIUM 20 MG PO TABS: 20.0000 mg | ORAL_TABLET | Freq: Every day | ORAL | 3 refills | Status: AC

## 2021-10-25 NOTE — Progress Notes (Signed)
ekg 

## 2021-10-25 NOTE — Patient Instructions (Signed)
Medication Instructions:  Your physician has recommended you make the following change in your medication:  -Start Atorvastatin 20 mg tablets once daily   Labwork: None  Testing/Procedures: None  Follow-Up: Follow up with Dr. Dellia Cloud in 1 year.   Any Other Special Instructions Will Be Listed Below (If Applicable).     If you need a refill on your cardiac medications before your next appointment, please call your pharmacy.

## 2021-10-25 NOTE — Progress Notes (Signed)
Cardiology Office Note  Date: 10/25/2021   ID: Sara Haynes, Sara Haynes September 03, 1945, MRN [id number]  PCP:  [facility]  Cardiologist:  Carlyle Dolly, MD Electrophysiologist:  None   Reason for Office Visit: HTN follow-up   History of Present Illness: Sara Haynes is a 76 y.o. female known to have HTN, HLD presented to the cardiology clinic for follow-up visit. No interval ER visits or hospitalizations. Patient denied any rest or exertional chest discomfort, tightness, heaviness or pressure, rest or exertional dyspnea, palpitations, light-headedness, syncope and LE swelling. Compliant with medications and no side-effects. No bleeding complications. Denied smoking cigarettes.  Patient has elevated blood pressure readings in the clinic/hospital ranging in 200s SBP.  However at home, her blood pressure readings range < 130 SBP.   Past Medical History:  Diagnosis Date   Abnormal electrocardiogram 02/03/2010   Sinus bradycardia   Anemia    Hyperlipidemia 11/04/2010   Hypertension 1985   Never successfully treated.   Medication intolerance    Tinnitus of both ears     Past Surgical History:  Procedure Laterality Date   ARM SKIN LESION BIOPSY / EXCISION Right    fatty tumor   CATARACT EXTRACTION  01/02/2009   Bilateral   COLONOSCOPY     COLONOSCOPY  09/20/2011   Procedure: COLONOSCOPY;  Surgeon: Rogene Houston, MD;  Location: [facility];  Service: Endoscopy;  Laterality: N/A;  930   EYE SURGERY Right 11/03/2010   cataract extraction   EYE SURGERY Left 01/03/2011   cataract extraction   HYSTEROSCOPY WITH D & C N/A 05/13/2013   Procedure: DILATATION AND CURETTAGE /HYSTEROSCOPY;  Surgeon: Jonnie Kind, MD;  Location: [facility];  Service: Gynecology;  Laterality: N/A;   POLYPECTOMY N/A 05/13/2013   Procedure: POLYPECTOMY (removal endometrial polyp);  Surgeon: Jonnie Kind, MD;  Location: [facility];  Service: Gynecology;  Laterality: N/A;   TOOTH EXTRACTION   2021   TUBAL LIGATION  01/03/1975    Current Outpatient Medications  Medication Sig Dispense Refill   atorvastatin (LIPITOR) 20 MG tablet Take 1 tablet (20 mg total) by mouth daily. 90 tablet 3   calcium-vitamin D (OSCAL WITH D) 500-200 MG-UNIT tablet Take 1 tablet by mouth 2 (two) times daily. 60 tablet 11   Multiple Vitamins-Minerals (WOMENS MULTIVITAMIN PLUS) TABS Take 1 tablet by mouth daily.     torsemide (DEMADEX) 5 MG tablet Take 5 mg by mouth daily.     aspirin 81 MG EC tablet Take 81 mg by mouth daily. (Patient not taking: Reported on 10/06/2021)     No current facility-administered medications for this visit.   Allergies:  Chlorthalidone, Amlodipine, Hydralazine, Hydrochlorothiazide w-triamterene, Lisinopril, and Spironolactone   Social History: The patient  reports that she has never smoked. She has never used smokeless tobacco. She reports that she does not drink alcohol and does not use drugs.   Family History: The patient's family history includes Alcohol abuse in her father; Breast cancer in her sister; COPD in her mother; Diabetes in her brother and mother; Hypertension in her brother, brother, brother, sister, sister, and sister; Pneumonia in her father.   ROS:  Please see the history of present illness. Otherwise, complete review of systems is positive for none.  All other systems are reviewed and negative.   Physical Exam: VS:  BP (!) 240/110   Pulse 78   Ht '5\' 6"'$  (1.676 m)   Wt 160 lb (72.6 kg)   SpO2 97%  BMI 25.82 kg/m , BMI Body mass index is 25.82 kg/m.  Wt Readings from Last 3 Encounters:  10/25/21 160 lb (72.6 kg)  10/06/21 158 lb (71.7 kg)  09/20/20 164 lb 8 oz (74.6 kg)    General: Patient appears comfortable at rest. HEENT: Conjunctiva and lids normal, oropharynx clear with moist mucosa. Neck: Supple, no elevated JVP or carotid bruits, no thyromegaly. Lungs: Clear to auscultation, nonlabored breathing at rest. Cardiac: Regular rate and rhythm, no  S3 or significant systolic murmur, no pericardial rub. Abdomen: Soft, nontender, no hepatomegaly, bowel sounds present, no guarding or rebound. Extremities: No pitting edema, distal pulses 2+. Skin: Warm and dry. Musculoskeletal: No kyphosis. Neuropsychiatric: Alert and oriented x3, affect grossly appropriate.  ECG:  An ECG dated 10/25/21 was personally reviewed today and demonstrated:  NSR  Recent Labwork: No results found for requested labs within last 365 days.     Component Value Date/Time   CHOL 207 (H) 07/18/2019 1327   TRIG 189 (H) 07/18/2019 1327   HDL 53 07/18/2019 1327   CHOLHDL 3.9 07/18/2019 1327   VLDL 24 12/23/2015 1250   LDLCALC 124 (H) 07/18/2019 1327    Other Studies Reviewed Today: Echo in 10/2019  1. Left ventricular ejection fraction, by estimation, is 60 to 65%. The  left ventricle has normal function. The left ventricle has no regional  wall motion abnormalities. Left ventricular diastolic parameters are  indeterminate.   2. Right ventricular systolic function is normal. The right ventricular  size is normal. There is normal pulmonary artery systolic pressure. The  estimated right ventricular systolic pressure is 84.1 mmHg.   3. The mitral valve is grossly normal. No evidence of mitral valve  regurgitation.   4. The aortic valve is tricuspid. Aortic valve regurgitation is mild.  Mild aortic valve sclerosis is present, with no evidence of aortic valve  stenosis.   5. The inferior vena cava is normal in size with greater than 50%  respiratory variability, suggesting right atrial pressure of 3 mmHg.   Assessment and Plan: Patient is a 76 year old F known to have HTN, HLD presented to cardiology clinic for a follow-up visit.  #HTN, currently at goal #Whitecoat hypertension Plan -Continue torsemide 5 mg once daily -Patient has elevated blood pressure readings in the clinic/hospital however her blood pressure readings at home were ranging from 110-130  SBP.  #HLD with goal less than 100 Plan -Start atorvastatin 20 mg nightly  I have spent a total of 32 minutes with patient reviewing chart, EKGs, labs and examining patient as well as establishing an assessment and plan that was discussed with the patient.  > 50% of time was spent in direct patient care.     Medication Adjustments/Labs and Tests Ordered: Current medicines are reviewed at length with the patient today.  Concerns regarding medicines are outlined above.   Tests Ordered: Orders Placed This Encounter  Procedures   EKG 12-Lead    Medication Changes: Meds ordered this encounter  Medications   atorvastatin (LIPITOR) 20 MG tablet    Sig: Take 1 tablet (20 mg total) by mouth daily.    Dispense:  90 tablet    Refill:  3    Disposition:  Follow up  one year  Signed, Shamina Etheridge Fidel Levy, MD, 10/25/2021 2:42 PM    [facility] [address] [place], [place] [address]

## 2021-11-02 ENCOUNTER — Encounter: Payer: Self-pay | Admitting: Obstetrics & Gynecology

## 2021-11-02 ENCOUNTER — Ambulatory Visit (INDEPENDENT_AMBULATORY_CARE_PROVIDER_SITE_OTHER): Payer: Medicare HMO | Admitting: Obstetrics & Gynecology

## 2021-11-02 VITALS — BP 232/105 | HR 88 | Ht 66.0 in | Wt 161.2 lb

## 2021-11-02 DIAGNOSIS — Z4689 Encounter for fitting and adjustment of other specified devices: Secondary | ICD-10-CM

## 2021-11-02 DIAGNOSIS — N8110 Cystocele, unspecified: Secondary | ICD-10-CM

## 2021-11-02 DIAGNOSIS — I10 Essential (primary) hypertension: Secondary | ICD-10-CM

## 2021-11-02 NOTE — Progress Notes (Signed)
     GYN VISIT Patient name: Sara Haynes MRN [id number]  Date of birth: 12-04-1945 Chief Complaint:    Chief Complaint  Patient presents with   Pessary Check   History of Present Illness:   Sara Haynes is a 76 y.o. G3P3003 PM female being seen today for pessary maintenance.  Last visit Oct 5- she was fitted with a Gelhorn.  So far, she has not had any problems.  She feels like it is working well and helping to hold things in place. Voiding improved- less incomplete emptying.  Urge incontinence also improved.    She uses a Tax adviser.   She reports little vaginal discharge and no vaginal bleeding   Likert scale(1 not bothersome -5 very bothersome)  :  1    Review of Systems:   Pertinent items are noted in HPI Denies fever/chills, dizziness, headaches, visual disturbances, fatigue, shortness of breath, chest pain, abdominal pain, vomiting, no bowel movements, urination, or intercourse unless otherwise stated above.  Pertinent History Reviewed:  Reviewed past medical,surgical, social, obstetrical and family history.  Reviewed problem list, medications and allergies. Physical Assessment:   Vitals:   11/02/21 1142 11/02/21 1148  BP: (!) 224/111 (!) 232/105  Pulse: 87 88  Weight: 161 lb 3.2 oz (73.1 kg)   Height: '5\' 6"'$  (1.676 m)   Body mass index is 26.02 kg/m.       Physical Examination:   General appearance: alert, well appearing, and in no distress  Psych: mood appropriate, normal affect  Skin: warm & dry   Cardiovascular: normal heart rate noted  Respiratory: normal respiratory effort, no distress  Abdomen: soft, non-tender   GU: normal external genitalia.   The pessary is removed. Vagina: Exam reveals no undue vaginal mucosal pressure of breakdown, no discharge and no vaginal bleeding.  Vaginal Epithelial Abnormality Classification System:   0 0    No abnormalities 1    Epithelial erythema 2    Granulation tissue 3    Epithelial break or erosion, 1 cm  or less 4    Epithelial break or erosion, 1 cm or greater   Irrigation completed- small area of spotting right at the introitus due to removal.  No other abnormalities noted.  Extremities: no edema   Chaperone: Celene Squibb    Assessment & Plan:     ICD-10-CM   1. Encounter for pessary maintenance  Z46.89     2. Pelvic organ prolapse quantification stage 3 cystocele  N81.10     3. Elevated blood pressure reading in office with white coat syndrome, with diagnosis of hypertension  I10       -doing well with pessary -f/u in 3 mos -again reports long-standing h/o white coat syndrome.  Reports that once she leaves the office it will go back down   Return in about 3 months (around 02/02/2022) for pessary maintenance (Obediah Welles or [employer]).   Janyth Pupa, DO Attending [place], [facility] for [employer], [place]

## 2022-01-03 DIAGNOSIS — I10 Essential (primary) hypertension: Secondary | ICD-10-CM | POA: Diagnosis not present

## 2022-01-03 DIAGNOSIS — R7301 Impaired fasting glucose: Secondary | ICD-10-CM | POA: Diagnosis not present

## 2022-01-12 DIAGNOSIS — N183 Chronic kidney disease, stage 3 unspecified: Secondary | ICD-10-CM | POA: Diagnosis not present

## 2022-01-12 DIAGNOSIS — E663 Overweight: Secondary | ICD-10-CM | POA: Diagnosis not present

## 2022-01-12 DIAGNOSIS — E785 Hyperlipidemia, unspecified: Secondary | ICD-10-CM | POA: Diagnosis not present

## 2022-01-12 DIAGNOSIS — Z6826 Body mass index (BMI) 26.0-26.9, adult: Secondary | ICD-10-CM | POA: Diagnosis not present

## 2022-01-12 DIAGNOSIS — R7301 Impaired fasting glucose: Secondary | ICD-10-CM | POA: Diagnosis not present

## 2022-01-12 DIAGNOSIS — D649 Anemia, unspecified: Secondary | ICD-10-CM | POA: Diagnosis not present

## 2022-01-12 DIAGNOSIS — I10 Essential (primary) hypertension: Secondary | ICD-10-CM | POA: Diagnosis not present

## 2022-01-12 DIAGNOSIS — Z79899 Other long term (current) drug therapy: Secondary | ICD-10-CM | POA: Diagnosis not present

## 2022-01-12 DIAGNOSIS — D696 Thrombocytopenia, unspecified: Secondary | ICD-10-CM | POA: Diagnosis not present

## 2022-01-12 DIAGNOSIS — N814 Uterovaginal prolapse, unspecified: Secondary | ICD-10-CM | POA: Diagnosis not present

## 2022-01-12 DIAGNOSIS — I129 Hypertensive chronic kidney disease with stage 1 through stage 4 chronic kidney disease, or unspecified chronic kidney disease: Secondary | ICD-10-CM | POA: Diagnosis not present

## 2022-01-12 DIAGNOSIS — Z0000 Encounter for general adult medical examination without abnormal findings: Secondary | ICD-10-CM | POA: Diagnosis not present

## 2022-01-18 DIAGNOSIS — I129 Hypertensive chronic kidney disease with stage 1 through stage 4 chronic kidney disease, or unspecified chronic kidney disease: Secondary | ICD-10-CM | POA: Diagnosis not present

## 2022-01-18 DIAGNOSIS — Z833 Family history of diabetes mellitus: Secondary | ICD-10-CM | POA: Diagnosis not present

## 2022-01-18 DIAGNOSIS — Z803 Family history of malignant neoplasm of breast: Secondary | ICD-10-CM | POA: Diagnosis not present

## 2022-01-18 DIAGNOSIS — Z8249 Family history of ischemic heart disease and other diseases of the circulatory system: Secondary | ICD-10-CM | POA: Diagnosis not present

## 2022-01-18 DIAGNOSIS — E785 Hyperlipidemia, unspecified: Secondary | ICD-10-CM | POA: Diagnosis not present

## 2022-01-18 DIAGNOSIS — N1832 Chronic kidney disease, stage 3b: Secondary | ICD-10-CM | POA: Diagnosis not present

## 2022-01-18 DIAGNOSIS — R32 Unspecified urinary incontinence: Secondary | ICD-10-CM | POA: Diagnosis not present

## 2022-01-18 DIAGNOSIS — R69 Illness, unspecified: Secondary | ICD-10-CM | POA: Diagnosis not present

## 2022-01-18 DIAGNOSIS — Z818 Family history of other mental and behavioral disorders: Secondary | ICD-10-CM | POA: Diagnosis not present

## 2022-01-18 DIAGNOSIS — Z811 Family history of alcohol abuse and dependence: Secondary | ICD-10-CM | POA: Diagnosis not present

## 2022-01-19 DIAGNOSIS — Z0100 Encounter for examination of eyes and vision without abnormal findings: Secondary | ICD-10-CM | POA: Diagnosis not present

## 2022-01-19 DIAGNOSIS — H524 Presbyopia: Secondary | ICD-10-CM | POA: Diagnosis not present

## 2022-02-02 ENCOUNTER — Ambulatory Visit (INDEPENDENT_AMBULATORY_CARE_PROVIDER_SITE_OTHER): Payer: Medicare HMO | Admitting: Obstetrics & Gynecology

## 2022-02-02 ENCOUNTER — Encounter: Payer: Self-pay | Admitting: Obstetrics & Gynecology

## 2022-02-02 VITALS — BP 220/100 | HR 77 | Ht 66.0 in | Wt 163.0 lb

## 2022-02-02 DIAGNOSIS — I10 Essential (primary) hypertension: Secondary | ICD-10-CM

## 2022-02-02 DIAGNOSIS — Z4689 Encounter for fitting and adjustment of other specified devices: Secondary | ICD-10-CM | POA: Diagnosis not present

## 2022-02-02 DIAGNOSIS — N8110 Cystocele, unspecified: Secondary | ICD-10-CM

## 2022-02-02 NOTE — Progress Notes (Signed)
Chief Complaint  Patient presents with   Pessary Check    Blood pressure (!) 220/100, pulse 77, height '5\' 6"'$  (1.676 m), weight 163 lb (73.9 kg).  Sara Haynes presents today for routine follow up related to her pessary.   She uses a Gelhorn 2 1/2 inch She reports no vaginal discharge and no vaginal bleeding   Likert scale(1 not bothersome -5 very bothersome)  :  1  Exam reveals no undue vaginal mucosal pressure of breakdown, no discharge and no vaginal bleeding.  Vaginal Epithelial Abnormality Classification System:   0 0    No abnormalities 1    Epithelial erythema 2    Granulation tissue 3    Epithelial break or erosion, 1 cm or less 4    Epithelial break or erosion, 1 cm or greater  The pessary is removed, cleaned and replaced without difficulty.      ICD-10-CM   1. Pessary maintenance: Gelhorn 2 1/2 inch placed 10/26/21  Z46.89     2. Pelvic organ prolapse quantification stage 3 cystocele  N81.10     3. White coat syndrome with diagnosis of hypertension  I10    patient states her BP at home will normalize, that it is always like this in docotr's visits, her PCP is aware       AMAI CAPPIELLO will be sen back in 4 months for continued follow up.  Florian Buff, MD  02/02/2022 11:27 AM

## 2022-04-19 DIAGNOSIS — R7301 Impaired fasting glucose: Secondary | ICD-10-CM | POA: Diagnosis not present

## 2022-04-19 DIAGNOSIS — I10 Essential (primary) hypertension: Secondary | ICD-10-CM | POA: Diagnosis not present

## 2022-04-25 DIAGNOSIS — G720 Drug-induced myopathy: Secondary | ICD-10-CM | POA: Diagnosis not present

## 2022-04-25 DIAGNOSIS — I129 Hypertensive chronic kidney disease with stage 1 through stage 4 chronic kidney disease, or unspecified chronic kidney disease: Secondary | ICD-10-CM | POA: Diagnosis not present

## 2022-04-25 DIAGNOSIS — E785 Hyperlipidemia, unspecified: Secondary | ICD-10-CM | POA: Diagnosis not present

## 2022-04-25 DIAGNOSIS — D649 Anemia, unspecified: Secondary | ICD-10-CM | POA: Diagnosis not present

## 2022-04-25 DIAGNOSIS — I10 Essential (primary) hypertension: Secondary | ICD-10-CM | POA: Diagnosis not present

## 2022-04-25 DIAGNOSIS — R7301 Impaired fasting glucose: Secondary | ICD-10-CM | POA: Diagnosis not present

## 2022-04-25 DIAGNOSIS — N814 Uterovaginal prolapse, unspecified: Secondary | ICD-10-CM | POA: Diagnosis not present

## 2022-04-25 DIAGNOSIS — D696 Thrombocytopenia, unspecified: Secondary | ICD-10-CM | POA: Diagnosis not present

## 2022-04-25 DIAGNOSIS — D72819 Decreased white blood cell count, unspecified: Secondary | ICD-10-CM | POA: Diagnosis not present

## 2022-04-25 DIAGNOSIS — Z6826 Body mass index (BMI) 26.0-26.9, adult: Secondary | ICD-10-CM | POA: Diagnosis not present

## 2022-04-25 DIAGNOSIS — E663 Overweight: Secondary | ICD-10-CM | POA: Diagnosis not present

## 2022-04-25 DIAGNOSIS — N183 Chronic kidney disease, stage 3 unspecified: Secondary | ICD-10-CM | POA: Diagnosis not present

## 2022-05-23 DIAGNOSIS — D61818 Other pancytopenia: Secondary | ICD-10-CM | POA: Diagnosis not present

## 2022-06-05 ENCOUNTER — Inpatient Hospital Stay: Payer: Medicare HMO | Attending: Hematology | Admitting: Hematology

## 2022-06-05 ENCOUNTER — Ambulatory Visit: Payer: Medicare HMO | Admitting: Obstetrics & Gynecology

## 2022-06-05 VITALS — BP 230/121 | HR 87 | Temp 96.6°F | Resp 20

## 2022-06-05 DIAGNOSIS — D631 Anemia in chronic kidney disease: Secondary | ICD-10-CM | POA: Insufficient documentation

## 2022-06-05 DIAGNOSIS — N189 Chronic kidney disease, unspecified: Secondary | ICD-10-CM | POA: Diagnosis not present

## 2022-06-05 DIAGNOSIS — D509 Iron deficiency anemia, unspecified: Secondary | ICD-10-CM | POA: Diagnosis not present

## 2022-06-05 DIAGNOSIS — Z803 Family history of malignant neoplasm of breast: Secondary | ICD-10-CM | POA: Insufficient documentation

## 2022-06-05 DIAGNOSIS — Z800 Family history of malignant neoplasm of digestive organs: Secondary | ICD-10-CM | POA: Diagnosis not present

## 2022-06-05 DIAGNOSIS — D61818 Other pancytopenia: Secondary | ICD-10-CM | POA: Diagnosis not present

## 2022-06-05 NOTE — Progress Notes (Signed)
[facility] [address] [place] [address]   Clinic Day:  06/05/2022  Referring physician: Lupita Raider, NP  Patient Care Team: Lupita Raider, NP as PCP - General (Family Medicine) Wyline Mood Dorothe Pea, MD as PCP - Cardiology (Cardiology)   ASSESSMENT & PLAN:   Assessment:  1.  Pancytopenia: - Patient seen at the request of Dr. Margo Aye. - 04/19/2022: WBC-2.9 (43 N, 44 L, 10 M, 3E), Hb-10.5, PLT-146, MCV-91, U04-540, folic acid-20, creatinine-1.22 - 01/04/2022: WBC-3, Hb-10.6, PLT-166 - 09/28/2021: WBC-3.1, Hb-10.4, PLT-143 - 03/02/2021: WBC-3.3, Hb-10.7, PLT-146 - Denies recurrent infections.  No B symptoms.  Intentional weight loss 40 pounds in the last 2 to 3 years, stopped drinking soda, eating candy and bread.  2.  Social/family history: - Lives by herself at home and is independent of ADLs and IADLs.  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  Non-smoker.  No chemical exposure. - Sister had breast cancer.  Brother had colon cancer.  No family history of leukemia.   Plan:  1.  Normocytic anemia: - Combination anemia from CKD, functional iron deficiency. - Will rule out other nutritional deficiencies.  Will also check for bone marrow infiltrative process and hemolysis. - RTC 3 to 4 weeks to discuss results and plan.  2.  Mild leukopenia and thrombocytopenia: - We will do blood work for nutritional deficiencies, connective tissue disorders, infectious etiologies and bone marrow infiltrative process. - Will also check ultrasound of the spleen for sequestration.   Orders Placed This Encounter  Procedures   US SPLEEN (ABDOMEN LIMITED)    Standing Status:   Future    Standing Expiration Date:   06/05/2023    Order Specific Question:   Reason for Exam (SYMPTOM  OR DIAGNOSIS REQUIRED)    Answer:   pancytopenia    Order Specific Question:   Preferred imaging location?    Answer:   [facility]    Order Specific Question:   Release to patient     Answer:   Immediate   Immature Platelet Fraction    Standing Status:   Future    Standing Expiration Date:   06/05/2023   Hepatitis B surface antigen    Standing Status:   Future    Standing Expiration Date:   06/05/2023   Hepatitis B surface antibody    Standing Status:   Future    Standing Expiration Date:   06/05/2023   Hepatitis B core antibody, total    Standing Status:   Future    Standing Expiration Date:   06/05/2023   Hepatitis C Antibody    Standing Status:   Future    Standing Expiration Date:   06/05/2023   CBC with Differential/Platelet    Standing Status:   Future    Standing Expiration Date:   06/05/2023    Order Specific Question:   Release to patient    Answer:   Immediate   Lactate dehydrogenase    Standing Status:   Future    Standing Expiration Date:   06/05/2023    Order Specific Question:   Release to patient    Answer:   Immediate   Reticulocytes    Standing Status:   Future    Standing Expiration Date:   06/05/2023   Ferritin    Standing Status:   Future    Standing Expiration Date:   06/05/2023    Order Specific Question:   Release to patient    Answer:  Immediate   Iron and TIBC    Standing Status:   Future    Standing Expiration Date:   06/05/2023    Order Specific Question:   Release to patient    Answer:   Immediate   Methylmalonic acid, serum    Standing Status:   Future    Standing Expiration Date:   06/05/2023   Copper, serum    Standing Status:   Future    Standing Expiration Date:   06/05/2023   Protein electrophoresis, serum    Standing Status:   Future    Standing Expiration Date:   06/05/2023    Order Specific Question:   Release to patient    Answer:   Immediate   Kappa/lambda light chains    Standing Status:   Future    Standing Expiration Date:   06/05/2023   Immunofixation electrophoresis    Standing Status:   Future    Standing Expiration Date:   06/05/2023    Order Specific Question:   Release to patient    Answer:   Immediate   Haptoglobin     Standing Status:   Future    Standing Expiration Date:   06/05/2023   ANA, IFA (with reflex)    Standing Status:   Future    Standing Expiration Date:   06/05/2023   Rheumatoid factor    Standing Status:   Future    Standing Expiration Date:   06/05/2023   Sedimentation rate    Standing Status:   Future    Standing Expiration Date:   06/05/2023   C-reactive protein    Standing Status:   Future    Standing Expiration Date:   06/05/2023      I,Katie Daubenspeck,acting as a scribe for Doreatha Massed, MD.,have documented all relevant documentation on the behalf of Doreatha Massed, MD,as directed by  Doreatha Massed, MD while in the presence of Doreatha Massed, MD.   I, Doreatha Massed MD, have reviewed the above documentation for accuracy and completeness, and I agree with the above.   Doreatha Massed, MD   6/3/20244:39 PM  CHIEF COMPLAINT/PURPOSE OF CONSULT:   Diagnosis: pancytopenia  Current Therapy: Under workup  HISTORY OF PRESENT ILLNESS:   Sara Haynes is a 77 y.o. female presenting to clinic today for evaluation of pancytopenia at the request of Lupita Raider, DNP.  She has a history of abnormal CBC since at least 09/2021. Her Hgb has been stable in the 10-range, platelets borderline low between 140k - 170k, and slightly low WBC, RBC, and HCT. Her most recent CBC from 05/05/22 showed: WBC 3.1, RBC 3.56, Hgb 10.7, HCT 33, platelet 139k. An iron panel was obtained on 05/24/22 was WNL showing: iron 43, sat 16%, TIBC 263. Vitamin B12 and folate were also obtained the same day and were both also WNL. She endorses taking oral iron and vitamin B supplements.  She denies recent chest pain on exertion, shortness of breath on minimal exertion, pre-syncopal episodes, or palpitations. She had not noticed any recent bleeding such as epistaxis, hematuria or hematochezia The patient denies over the counter NSAID ingestion. She is not  on antiplatelets agents. Of note, she  also had Cologuard test done on 10/12/21, which was negative. She had no prior history or diagnosis of cancer. She denies any family history of cancer.  Her age appropriate screening programs are up-to-date. She denies any pica and eats a variety of diet. She never donated blood or received blood transfusion.  Today, she states  that she is doing well overall. Her appetite level is at 100%. Her energy level is at 80%.  PAST MEDICAL HISTORY:   Past Medical History: Past Medical History:  Diagnosis Date   Abnormal electrocardiogram 02/03/2010   Sinus bradycardia   Anemia    Hyperlipidemia 11/04/2010   Hypertension 1985   Never successfully treated.   Medication intolerance    Tinnitus of both ears     Surgical History: Past Surgical History:  Procedure Laterality Date   ARM SKIN LESION BIOPSY / EXCISION Right    fatty tumor   CATARACT EXTRACTION  01/02/2009   Bilateral   COLONOSCOPY     COLONOSCOPY  09/20/2011   Procedure: COLONOSCOPY;  Surgeon: Malissa Hippo, MD;  Location: [facility];  Service: Endoscopy;  Laterality: N/A;  930   EYE SURGERY Right 11/03/2010   cataract extraction   EYE SURGERY Left 01/03/2011   cataract extraction   HYSTEROSCOPY WITH D & C N/A 05/13/2013   Procedure: DILATATION AND CURETTAGE /HYSTEROSCOPY;  Surgeon: Tilda Burrow, MD;  Location: [facility];  Service: Gynecology;  Laterality: N/A;   POLYPECTOMY N/A 05/13/2013   Procedure: POLYPECTOMY (removal endometrial polyp);  Surgeon: Tilda Burrow, MD;  Location: [facility];  Service: Gynecology;  Laterality: N/A;   TOOTH EXTRACTION  2021   TUBAL LIGATION  01/03/1975    Social History: Social History   Socioeconomic History   Marital status: Married    Spouse name: Not on file   Number of children: 3   Years of education: Not on file   Highest education level: Not on file  Occupational History   Occupation: Retired  Tobacco Use   Smoking status: Never   Smokeless tobacco: Never  Vaping Use    Vaping Use: Never used  Substance and Sexual Activity   Alcohol use: No   Drug use: No   Sexual activity: Not Currently    Birth control/protection: Post-menopausal  Other Topics Concern   Not on file  Social History Narrative   Not on file   Social Determinants of Health   Financial Resource Strain: Medium Risk (10/06/2021)   Overall Financial Resource Strain (CARDIA)    Difficulty of Paying Living Expenses: Somewhat hard  Food Insecurity: No Food Insecurity (10/06/2021)   Hunger Vital Sign    Worried About Running Out of Food in the Last Year: Never true    Ran Out of Food in the Last Year: Never true  Transportation Needs: No Transportation Needs (10/06/2021)   PRAPARE - Administrator, Civil Service (Medical): No    Lack of Transportation (Non-Medical): No  Physical Activity: Sufficiently Active (10/06/2021)   Exercise Vital Sign    Days of Exercise per Week: 6 days    Minutes of Exercise per Session: 30 min  Stress: No Stress Concern Present (10/06/2021)   [employer] of Occupational Health - Occupational Stress Questionnaire    Feeling of Stress : Only a little  Social Connections: Moderately Integrated (10/06/2021)   Social Connection and Isolation Panel [NHANES]    Frequency of Communication with Friends and Family: Twice a week    Frequency of Social Gatherings with Friends and Family: Twice a week    Attends Religious Services: More than 4 times per year    Active Member of [employer] or Organizations: Yes    Attends Banker Meetings: More than 4 times per year    Marital Status: Widowed  Intimate Partner Violence: Not At Risk (  10/06/2021)   Humiliation, Afraid, Rape, and Kick questionnaire    Fear of Current or Ex-Partner: No    Emotionally Abused: No    Physically Abused: No    Sexually Abused: No    Family History: Family History  Problem Relation Age of Onset   COPD Mother    Diabetes Mother    Pneumonia Father    Alcohol abuse  Father    Hypertension Sister    Hypertension Sister    Breast cancer Sister    Hypertension Sister    Hypertension Brother    Diabetes Brother    Hypertension Brother    Hypertension Brother     Current Medications:  Current Outpatient Medications:    aspirin 81 MG EC tablet, Take 81 mg by mouth daily., Disp: , Rfl:    atorvastatin (LIPITOR) 20 MG tablet, Take 1 tablet (20 mg total) by mouth at bedtime., Disp: 90 tablet, Rfl: 3   calcium-vitamin D (OSCAL WITH D) 500-200 MG-UNIT tablet, Take 1 tablet by mouth 2 (two) times daily., Disp: 60 tablet, Rfl: 11   Multiple Vitamins-Minerals (WOMENS MULTIVITAMIN PLUS) TABS, Take 1 tablet by mouth daily., Disp: , Rfl:    torsemide (DEMADEX) 5 MG tablet, Take 5 mg by mouth daily., Disp: , Rfl:    Allergies: Allergies  Allergen Reactions   Chlorthalidone Other (See Comments)    States medication is drunk and dizzy   Amlodipine     All over cramps   Hydralazine Other (See Comments)   Hydrochlorothiazide W-Triamterene     REACTION: cramps   Lisinopril     Dizzy and leg cramps    Spironolactone Other (See Comments)    muscle cramps    REVIEW OF SYSTEMS:   Review of Systems  Constitutional:  Negative for chills, fatigue and fever.  HENT:   Negative for lump/mass, mouth sores, nosebleeds, sore throat and trouble swallowing.   Eyes:  Negative for eye problems.  Respiratory:  Negative for cough and shortness of breath.   Cardiovascular:  Negative for chest pain, leg swelling and palpitations.  Gastrointestinal:  Negative for abdominal pain, constipation, diarrhea, nausea and vomiting.  Genitourinary:  Negative for bladder incontinence, difficulty urinating, dysuria, frequency, hematuria and nocturia.   Musculoskeletal:  Negative for arthralgias, back pain, flank pain, myalgias and neck pain.  Skin:  Negative for itching and rash.  Neurological:  Positive for dizziness and numbness. Negative for headaches.  Hematological:  Does not  bruise/bleed easily.  Psychiatric/Behavioral:  Negative for depression, sleep disturbance and suicidal ideas. The patient is not nervous/anxious.   All other systems reviewed and are negative.    VITALS:   Blood pressure (!) 230/121, pulse 87, temperature (!) 96.6 F (35.9 C), temperature source Oral, resp. rate 20, SpO2 100 %.  Wt Readings from Last 3 Encounters:  02/02/22 163 lb (73.9 kg)  11/02/21 161 lb 3.2 oz (73.1 kg)  10/25/21 160 lb (72.6 kg)    There is no height or weight on file to calculate BMI.   PHYSICAL EXAM:   Physical Exam Vitals and nursing note reviewed. Exam conducted with a chaperone present.  Constitutional:      Appearance: Normal appearance.  Cardiovascular:     Rate and Rhythm: Normal rate and regular rhythm.     Pulses: Normal pulses.     Heart sounds: Normal heart sounds.  Pulmonary:     Effort: Pulmonary effort is normal.     Breath sounds: Normal breath sounds.  Abdominal:  Palpations: Abdomen is soft. There is no hepatomegaly, splenomegaly or mass.     Tenderness: There is no abdominal tenderness.  Musculoskeletal:     Right lower leg: No edema.     Left lower leg: No edema.  Lymphadenopathy:     Cervical: No cervical adenopathy.     Right cervical: No superficial, deep or posterior cervical adenopathy.    Left cervical: No superficial, deep or posterior cervical adenopathy.     Upper Body:     Right upper body: No supraclavicular or axillary adenopathy.     Left upper body: No supraclavicular or axillary adenopathy.  Neurological:     General: No focal deficit present.     Mental Status: She is alert and oriented to person, place, and time.  Psychiatric:        Mood and Affect: Mood normal.        Behavior: Behavior normal.     LABS:      Latest Ref Rng & Units 07/18/2019    1:27 PM 07/23/2018   10:20 AM 03/07/2017   10:28 AM  CBC  WBC 3.8 - 10.8 Thousand/uL 4.1  3.7  4.0   Hemoglobin 11.7 - 15.5 g/dL 16.1  09.6  04.5    Hematocrit 35.0 - 45.0 % 35.2  33.4  33.8   Platelets 140 - 400 Thousand/uL 179  178  178       Latest Ref Rng & Units 07/18/2019    1:27 PM 03/18/2019   10:41 AM 07/23/2018   10:20 AM  CMP  Glucose 65 - 99 mg/dL 93  409  811   BUN 7 - 25 mg/dL 22  17  13    Creatinine 0.60 - 0.93 mg/dL 9.14  7.82  9.56   Sodium 135 - 146 mmol/L 139  142  141   Potassium 3.5 - 5.3 mmol/L 5.0  4.0  3.9   Chloride 98 - 110 mmol/L 103  105  108   CO2 20 - 32 mmol/L 27  29  30    Calcium 8.6 - 10.4 mg/dL 21.3  9.3  9.5   Total Protein 6.1 - 8.1 g/dL 8.2   7.7   Total Bilirubin 0.2 - 1.2 mg/dL 0.7   0.8   AST 10 - 35 U/L 18   20   ALT 6 - 29 U/L 18   13      No results found for: "CEA1", "CEA" / No results found for: "CEA1", "CEA" No results found for: "PSA1" No results found for: "YQM578" No results found for: "CAN125"  No results found for: "TOTALPROTELP", "ALBUMINELP", "A1GS", "A2GS", "BETS", "BETA2SER", "GAMS", "MSPIKE", "SPEI" Lab Results  Component Value Date   FERRITIN 166 12/24/2015   FERRITIN 277 06/18/2014   FERRITIN 222 03/22/2013   No results found for: "LDH"   STUDIES:   No results found.

## 2022-06-05 NOTE — Patient Instructions (Signed)
[facility]  Discharge Instructions  You were seen and examined today by Dr. Ellin Saba. Dr. Ellin Saba is a hematologist, meaning that he specializes in blood abnormalities. Dr. Ellin Saba discussed your past medical history, family history of cancers/blood conditions and the events that led to you being here today.  You were referred to Dr. Ellin Saba due to pancytopenia.  Dr. Ellin Saba has recommended additional labs today for further evaluation.  Follow-up as scheduled.    Thank you for choosing  [facility] to provide your oncology and hematology care.   To afford each patient quality time with our provider, please arrive at least 15 minutes before your scheduled appointment time. You may need to reschedule your appointment if you arrive late (10 or more minutes). Arriving late affects you and other patients whose appointments are after yours.  Also, if you miss three or more appointments without notifying the office, you may be dismissed from the clinic at the provider's discretion.    Again, thank you for choosing [facility]  Our hope is that these requests will decrease the amount of time that you wait before being seen by our physicians.   If you have a lab appointment with the Cancer Center - please note that after April 8th, all labs will be drawn in the cancer center.  You do not have to check in or register with the main entrance as you have in the past but will complete your check-in at the cancer center.            _____________________________________________________________  Should you have questions after your visit to [facility], please contact our office at [phone] and follow the prompts.  Our office hours are 8:00 a.m. to 4:30 p.m. Monday - Thursday and 8:00 a.m. to 2:30 p.m. Friday.  Please note that voicemails left after 4:00 p.m. may not be returned until the following business  day.  We are closed weekends and all major holidays.  You do have access to a nurse 24-7, just call the main number to the clinic [phone] and do not press any options, hold on the line and a nurse will answer the phone.    For prescription refill requests, have your pharmacy contact our office and allow 72 hours.    Masks are no longer required in the cancer centers. If you would like for your care team to wear a mask while they are taking care of you, please let them know. You may have one support person who is at least 78 years old accompany you for your appointments.

## 2022-06-06 ENCOUNTER — Inpatient Hospital Stay: Payer: Medicare HMO

## 2022-06-06 DIAGNOSIS — D61818 Other pancytopenia: Secondary | ICD-10-CM

## 2022-06-06 DIAGNOSIS — Z803 Family history of malignant neoplasm of breast: Secondary | ICD-10-CM | POA: Diagnosis not present

## 2022-06-06 DIAGNOSIS — D631 Anemia in chronic kidney disease: Secondary | ICD-10-CM | POA: Diagnosis not present

## 2022-06-06 DIAGNOSIS — D509 Iron deficiency anemia, unspecified: Secondary | ICD-10-CM | POA: Diagnosis not present

## 2022-06-06 DIAGNOSIS — N189 Chronic kidney disease, unspecified: Secondary | ICD-10-CM | POA: Diagnosis not present

## 2022-06-06 DIAGNOSIS — Z800 Family history of malignant neoplasm of digestive organs: Secondary | ICD-10-CM | POA: Diagnosis not present

## 2022-06-06 LAB — CBC WITH DIFFERENTIAL/PLATELET
Abs Immature Granulocytes: 0 10*3/uL (ref 0.00–0.07)
Basophils Absolute: 0 10*3/uL (ref 0.0–0.1)
Basophils Relative: 0 %
Eosinophils Absolute: 0.1 10*3/uL (ref 0.0–0.5)
Eosinophils Relative: 2 %
HCT: 34.5 % — ABNORMAL LOW (ref 36.0–46.0)
Hemoglobin: 11.1 g/dL — ABNORMAL LOW (ref 12.0–15.0)
Immature Granulocytes: 0 %
Lymphocytes Relative: 42 %
Lymphs Abs: 1.2 10*3/uL (ref 0.7–4.0)
MCH: 30 pg (ref 26.0–34.0)
MCHC: 32.2 g/dL (ref 30.0–36.0)
MCV: 93.2 fL (ref 80.0–100.0)
Monocytes Absolute: 0.3 10*3/uL (ref 0.1–1.0)
Monocytes Relative: 9 %
Neutro Abs: 1.4 10*3/uL — ABNORMAL LOW (ref 1.7–7.7)
Neutrophils Relative %: 47 %
Platelets: 167 10*3/uL (ref 150–400)
RBC: 3.7 MIL/uL — ABNORMAL LOW (ref 3.87–5.11)
RDW: 13.2 % (ref 11.5–15.5)
WBC: 3 10*3/uL — ABNORMAL LOW (ref 4.0–10.5)
nRBC: 0 % (ref 0.0–0.2)

## 2022-06-06 LAB — RETICULOCYTES
Immature Retic Fract: 10.9 % (ref 2.3–15.9)
RBC.: 3.67 MIL/uL — ABNORMAL LOW (ref 3.87–5.11)
Retic Count, Absolute: 53.2 10*3/uL (ref 19.0–186.0)
Retic Ct Pct: 1.5 % (ref 0.4–3.1)

## 2022-06-06 LAB — C-REACTIVE PROTEIN: CRP: 0.6 mg/dL (ref <1.0)

## 2022-06-06 LAB — IRON AND TIBC
Iron: 71 ug/dL (ref 28–170)
Saturation Ratios: 22 % (ref 10.4–31.8)
TIBC: 331 ug/dL (ref 250–450)
UIBC: 260 ug/dL

## 2022-06-06 LAB — HEPATITIS B SURFACE ANTIBODY,QUALITATIVE: Hep B S Ab: REACTIVE — AB

## 2022-06-06 LAB — LACTATE DEHYDROGENASE: LDH: 163 U/L (ref 98–192)

## 2022-06-06 LAB — HEPATITIS B SURFACE ANTIGEN: Hepatitis B Surface Ag: NONREACTIVE

## 2022-06-06 LAB — FERRITIN: Ferritin: 186 ng/mL (ref 11–307)

## 2022-06-06 LAB — SEDIMENTATION RATE: Sed Rate: 18 mm/hr (ref 0–22)

## 2022-06-06 LAB — HEPATITIS B CORE ANTIBODY, TOTAL: Hep B Core Total Ab: NONREACTIVE

## 2022-06-06 LAB — IMMATURE PLATELET FRACTION: Immature Platelet Fraction: 6 % (ref 1.2–8.6)

## 2022-06-06 LAB — HEPATITIS C ANTIBODY: HCV Ab: NONREACTIVE

## 2022-06-07 LAB — KAPPA/LAMBDA LIGHT CHAINS
Kappa free light chain: 17.2 mg/L (ref 3.3–19.4)
Kappa, lambda light chain ratio: 1.43 (ref 0.26–1.65)
Lambda free light chains: 12 mg/L (ref 5.7–26.3)

## 2022-06-08 LAB — HAPTOGLOBIN: Haptoglobin: 148 mg/dL (ref 42–346)

## 2022-06-08 LAB — COPPER, SERUM: Copper: 123 ug/dL (ref 80–158)

## 2022-06-08 LAB — RHEUMATOID FACTOR: Rheumatoid fact SerPl-aCnc: <10 IU/mL (ref <14.0)

## 2022-06-09 LAB — PROTEIN ELECTROPHORESIS, SERUM
A/G Ratio: 1.1 (ref 0.7–1.7)
Albumin ELP: 4.2 g/dL (ref 2.9–4.4)
Alpha-1-Globulin: 0.3 g/dL (ref 0.0–0.4)
Alpha-2-Globulin: 0.7 g/dL (ref 0.4–1.0)
Beta Globulin: 1.2 g/dL (ref 0.7–1.3)
Gamma Globulin: 1.7 g/dL (ref 0.4–1.8)
Globulin, Total: 3.8 g/dL (ref 2.2–3.9)
M-Spike, %: 1.2 g/dL — ABNORMAL HIGH
Total Protein ELP: 8 g/dL (ref 6.0–8.5)

## 2022-06-09 LAB — ANTINUCLEAR ANTIBODIES, IFA: ANA Ab, IFA: NEGATIVE

## 2022-06-09 LAB — METHYLMALONIC ACID, SERUM: Methylmalonic Acid, Quantitative: 167 nmol/L (ref 0–378)

## 2022-06-10 LAB — IMMUNOFIXATION ELECTROPHORESIS
IgA: 89 mg/dL (ref 64–422)
IgG (Immunoglobin G), Serum: 1937 mg/dL — ABNORMAL HIGH (ref 586–1602)
IgM (Immunoglobulin M), Srm: 15 mg/dL — ABNORMAL LOW (ref 26–217)
Total Protein ELP: 7.7 g/dL (ref 6.0–8.5)

## 2022-06-13 ENCOUNTER — Ambulatory Visit (INDEPENDENT_AMBULATORY_CARE_PROVIDER_SITE_OTHER): Payer: Medicare HMO | Admitting: Obstetrics & Gynecology

## 2022-06-13 ENCOUNTER — Encounter: Payer: Self-pay | Admitting: Obstetrics & Gynecology

## 2022-06-13 VITALS — BP 231/125 | HR 92 | Ht 66.0 in | Wt 159.0 lb

## 2022-06-13 DIAGNOSIS — I10 Essential (primary) hypertension: Secondary | ICD-10-CM | POA: Diagnosis not present

## 2022-06-13 DIAGNOSIS — N8110 Cystocele, unspecified: Secondary | ICD-10-CM

## 2022-06-13 DIAGNOSIS — Z4689 Encounter for fitting and adjustment of other specified devices: Secondary | ICD-10-CM

## 2022-06-13 NOTE — Progress Notes (Signed)
Chief Complaint  Patient presents with   Pessary Check    Blood pressure (!) 241/126, pulse 94, height 5\' 6"  (1.676 m), weight 159 lb (72.1 kg).  Sara Haynes presents today for routine follow up related to her pessary.   She uses a Gelhorn 2 1/2 inch She reports no vaginal discharge and no vaginal bleeding   Likert scale(1 not bothersome -5 very bothersome)  :  1  Exam reveals no undue vaginal mucosal pressure of breakdown, no discharge and no vaginal bleeding.  Vaginal Epithelial Abnormality Classification System:   0 0    No abnormalities 1    Epithelial erythema 2    Granulation tissue 3    Epithelial break or erosion, 1 cm or less 4    Epithelial break or erosion, 1 cm or greater  The pessary is removed, cleaned and replaced without difficulty.      ICD-10-CM   1. Pessary maintenance: Gelhorn 2 1/2 inch placed 10/26/21  Z46.89     2. Pelvic organ prolapse quantification stage 3 cystocele  N81.10     3. White coat syndrome with diagnosis of hypertension  I10    patient states her BP at home will normalize, that it is always like this in docotr's visits, her PCP is aware       SHALETTA HINOSTROZA will be sen back in 4 months for continued follow up.  Lazaro Arms, MD  06/13/2022 11:09 AM

## 2022-06-26 ENCOUNTER — Ambulatory Visit (HOSPITAL_COMMUNITY)
Admission: RE | Admit: 2022-06-26 | Discharge: 2022-06-26 | Disposition: A | Discharge status: Home / Self Care | Payer: Medicare HMO | Source: Ambulatory Visit | Attending: Hematology | Admitting: Hematology

## 2022-06-26 DIAGNOSIS — D61818 Other pancytopenia: Secondary | ICD-10-CM | POA: Diagnosis not present

## 2022-07-04 ENCOUNTER — Inpatient Hospital Stay: Payer: Medicare HMO | Attending: Hematology | Admitting: Hematology

## 2022-07-04 ENCOUNTER — Inpatient Hospital Stay: Payer: Medicare HMO

## 2022-07-04 ENCOUNTER — Encounter: Payer: Self-pay | Admitting: Hematology

## 2022-07-04 VITALS — BP 223/108 | HR 96 | Temp 98.0°F | Resp 18 | Wt 160.1 lb

## 2022-07-04 DIAGNOSIS — N189 Chronic kidney disease, unspecified: Secondary | ICD-10-CM | POA: Insufficient documentation

## 2022-07-04 DIAGNOSIS — D72819 Decreased white blood cell count, unspecified: Secondary | ICD-10-CM | POA: Insufficient documentation

## 2022-07-04 DIAGNOSIS — D472 Monoclonal gammopathy: Secondary | ICD-10-CM | POA: Insufficient documentation

## 2022-07-04 DIAGNOSIS — D509 Iron deficiency anemia, unspecified: Secondary | ICD-10-CM | POA: Diagnosis not present

## 2022-07-04 DIAGNOSIS — D61818 Other pancytopenia: Secondary | ICD-10-CM | POA: Insufficient documentation

## 2022-07-04 DIAGNOSIS — D631 Anemia in chronic kidney disease: Secondary | ICD-10-CM | POA: Insufficient documentation

## 2022-07-04 DIAGNOSIS — E785 Hyperlipidemia, unspecified: Secondary | ICD-10-CM | POA: Diagnosis not present

## 2022-07-04 LAB — COMPREHENSIVE METABOLIC PANEL
ALT: 16 U/L (ref 0–44)
AST: 21 U/L (ref 15–41)
Albumin: 4.2 g/dL (ref 3.5–5.0)
Alkaline Phosphatase: 87 U/L (ref 38–126)
Anion gap: 8 (ref 5–15)
BUN: 19 mg/dL (ref 8–23)
CO2: 27 mmol/L (ref 22–32)
Calcium: 9 mg/dL (ref 8.9–10.3)
Chloride: 103 mmol/L (ref 98–111)
Creatinine, Ser: 1.02 mg/dL — ABNORMAL HIGH (ref 0.44–1.00)
GFR, Estimated: 57 mL/min — ABNORMAL LOW (ref >60)
Glucose, Bld: 95 mg/dL (ref 70–99)
Potassium: 4.1 mmol/L (ref 3.5–5.1)
Sodium: 138 mmol/L (ref 135–145)
Total Bilirubin: 0.7 mg/dL (ref 0.3–1.2)
Total Protein: 7.9 g/dL (ref 6.5–8.1)

## 2022-07-04 LAB — MAGNESIUM: Magnesium: 2.1 mg/dL (ref 1.7–2.4)

## 2022-07-04 NOTE — Patient Instructions (Addendum)
[facility]  Discharge Instructions  You were seen and examined today by Dr. Ellin Saba.  Your labs revealed a condition known as MGUS (monoclonoal gammopathy of unknown significance). This is considered a pre-cancerous condition, though most people do not transition to cancer or require treatment in their lifetime.  Dr. Ellin Saba will draw additional labs today as well as a 24 hour urine test.  Dr. Ellin Saba has also recommended a skeletal survey X-Ray.  Follow-up as scheduled.  Thank you for choosing [facility] to provide your oncology and hematology care.   To afford each patient quality time with our provider, please arrive at least 15 minutes before your scheduled appointment time. You may need to reschedule your appointment if you arrive late (10 or more minutes). Arriving late affects you and other patients whose appointments are after yours.  Also, if you miss three or more appointments without notifying the office, you may be dismissed from the clinic at the provider's discretion.    Again, thank you for choosing [facility].  Our hope is that these requests will decrease the amount of time that you wait before being seen by our physicians.   If you have a lab appointment with the Cancer Center - please note that after April 8th, all labs will be drawn in the cancer center.  You do not have to check in or register with the main entrance as you have in the past but will complete your check-in at the cancer center.            _____________________________________________________________  Should you have questions after your visit to [facility], please contact our office at [phone] and follow the prompts.  Our office hours are 8:00 a.m. to 4:30 p.m. Monday - Thursday and 8:00 a.m. to 2:30 p.m. Friday.  Please note that voicemails left after 4:00 p.m. may not be returned until the following  business day.  We are closed weekends and all major holidays.  You do have access to a nurse 24-7, just call the main number to the clinic [phone] and do not press any options, hold on the line and a nurse will answer the phone.    For prescription refill requests, have your pharmacy contact our office and allow 72 hours.    Masks are no longer required in the cancer centers. If you would like for your care team to wear a mask while they are taking care of you, please let them know. You may have one support person who is at least 78 years old accompany you for your appointments.

## 2022-07-04 NOTE — Progress Notes (Signed)
[facility] [address] [place] [address]    Clinic Day:  07/04/2022  Referring physician: Lupita Raider, NP  Patient Care Team: Lupita Raider, NP as PCP - General (Family Medicine) Wyline Mood Dorothe Pea, MD as PCP - Cardiology (Cardiology) Doreatha Massed, MD as Medical Oncologist (Hematology)   ASSESSMENT & PLAN:   Assessment: 1.  Pancytopenia: - Patient seen at the request of Dr. Margo Aye. - 04/19/2022: WBC-2.9 (43 N, 44 L, 10 M, 3E), Hb-10.5, PLT-146, MCV-91, U04-540, folic acid-20, creatinine-1.22 - 01/04/2022: WBC-3, Hb-10.6, PLT-166 - 09/28/2021: WBC-3.1, Hb-10.4, PLT-143 - 03/02/2021: WBC-3.3, Hb-10.7, PLT-146 - Denies recurrent infections.  No B symptoms.  Intentional weight loss 40 pounds in the last 2 to 3 years, stopped drinking soda, eating candy and bread.   2.  Social/family history: - Lives by herself at home and is independent of ADLs and IADLs.  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  Non-smoker.  No chemical exposure. - Sister had breast cancer.  Brother had colon cancer.  No family history of leukemia.    Plan: 1.  IgG kappa MGUS: - Workup for pancytopenia showed M spike of 1.2 g.  Immunofixation shows IgG kappa.  Free light chain ratio is normal at 1.43 and kappa light chains of 17.2. - We have discussed the spectrum of plasma cell disorders in general and MGUS in particular. - Recommend further workup with skeletal survey, beta-2 microglobulin, comprehensive metabolic panel.  Will also check 24-hour urine for total protein, UPEP, urine free light chains and immunofixation. - RTC 2 to 3 weeks for follow-up.  2.  Normocytic anemia: - Combination anemia from CKD and functional iron deficiency. - Reviewed blood work from 06/06/2022: Ferritin 186, percent saturation 22.  B12, folic acid, MMA and copper levels were normal.   3.  Mild leukopenia: - Workup for nutritional deficiencies, connective tissue disorders was negative. -  Ultrasound of the spleen was normal in size. 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  No increase in infection risk.  ANC is 1.4.  Will closely monitor.    Orders Placed This Encounter  Procedures   DG Bone Survey Met    Standing Status:   Future    Standing Expiration Date:   07/04/2023    Order Specific Question:   Reason for Exam (SYMPTOM  OR DIAGNOSIS REQUIRED)    Answer:   MGUS    Order Specific Question:   Preferred imaging location?    Answer:   [facility]    Order Specific Question:   Release to patient    Answer:   Immediate   Comprehensive metabolic panel    Standing Status:   Future    Number of Occurrences:   1    Standing Expiration Date:   07/04/2023   Beta 2 microglobuline, serum    Standing Status:   Future    Number of Occurrences:   1    Standing Expiration Date:   07/04/2023   24 hr Ur UPEP/UIFE/Light Chains/TP    Standing Status:   Future    Standing Expiration Date:   07/04/2023    Order Specific Question:   Release to patient    Answer:   Immediate   Magnesium    Standing Status:   Future    Number of Occurrences:   1    Standing Expiration Date:   07/04/2023      I,Katie Daubenspeck,acting as a scribe for Doreatha Massed, MD.,have  documented all relevant documentation on the behalf of Doreatha Massed, MD,as directed by  Doreatha Massed, MD while in the presence of Doreatha Massed, MD.   I, Doreatha Massed MD, have reviewed the above documentation for accuracy and completeness, and I agree with the above.   Doreatha Massed, MD   7/2/20245:44 PM  CHIEF COMPLAINT:   Diagnosis: pancytopenia    Cancer Staging  No matching staging information was found for the patient.   Prior Therapy: none  Current Therapy:  oral iron and vitamin B supplements   HISTORY OF PRESENT ILLNESS:   Oncology History   No history exists.     INTERVAL HISTORY:   Sara Haynes is a 77 y.o. female presenting to clinic today for  follow up of pancytopenia. She was last seen by me on 06/05/22 in consultation.  Since her last visit, she underwent spleen US on 06/26/22 showing normal sized spleen.  Today, she states that she is doing well overall. Her appetite level is at 100%. Her energy level is at 100%.  PAST MEDICAL HISTORY:   Past Medical History: Past Medical History:  Diagnosis Date   Abnormal electrocardiogram 02/03/2010   Sinus bradycardia   Anemia    Hyperlipidemia 11/04/2010   Hypertension 1985   Never successfully treated.   Medication intolerance    Tinnitus of both ears     Surgical History: Past Surgical History:  Procedure Laterality Date   ARM SKIN LESION BIOPSY / EXCISION Right    fatty tumor   CATARACT EXTRACTION  01/02/2009   Bilateral   COLONOSCOPY     COLONOSCOPY  09/20/2011   Procedure: COLONOSCOPY;  Surgeon: Malissa Hippo, MD;  Location: [facility];  Service: Endoscopy;  Laterality: N/A;  930   EYE SURGERY Right 11/03/2010   cataract extraction   EYE SURGERY Left 01/03/2011   cataract extraction   HYSTEROSCOPY WITH D & C N/A 05/13/2013   Procedure: DILATATION AND CURETTAGE /HYSTEROSCOPY;  Surgeon: Tilda Burrow, MD;  Location: [facility];  Service: Gynecology;  Laterality: N/A;   POLYPECTOMY N/A 05/13/2013   Procedure: POLYPECTOMY (removal endometrial polyp);  Surgeon: Tilda Burrow, MD;  Location: [facility];  Service: Gynecology;  Laterality: N/A;   TOOTH EXTRACTION  2021   TUBAL LIGATION  01/03/1975    Social History: Social History   Socioeconomic History   Marital status: Married    Spouse name: Not on file   Number of children: 3   Years of education: Not on file   Highest education level: Not on file  Occupational History   Occupation: Retired  Tobacco Use   Smoking status: Never   Smokeless tobacco: Never  Vaping Use   Vaping Use: Never used  Substance and Sexual Activity   Alcohol use: No   Drug use: No   Sexual activity: Not Currently    Birth  control/protection: Post-menopausal  Other Topics Concern   Not on file  Social History Narrative   Not on file   Social Determinants of Health   Financial Resource Strain: Medium Risk (10/06/2021)   Overall Financial Resource Strain (CARDIA)    Difficulty of Paying Living Expenses: Somewhat hard  Food Insecurity: No Food Insecurity (10/06/2021)   Hunger Vital Sign    Worried About Running Out of Food in the Last Year: Never true    Ran Out of Food in the Last Year: Never true  Transportation Needs: No Transportation Needs (10/06/2021)   PRAPARE - Transportation    Lack of  Transportation (Medical): No    Lack of Transportation (Non-Medical): No  Physical Activity: Sufficiently Active (10/06/2021)   Exercise Vital Sign    Days of Exercise per Week: 6 days    Minutes of Exercise per Session: 30 min  Stress: No Stress Concern Present (10/06/2021)   [employer] of Occupational Health - Occupational Stress Questionnaire    Feeling of Stress : Only a little  Social Connections: Moderately Integrated (10/06/2021)   Social Connection and Isolation Panel [NHANES]    Frequency of Communication with Friends and Family: Twice a week    Frequency of Social Gatherings with Friends and Family: Twice a week    Attends Religious Services: More than 4 times per year    Active Member of [employer] or Organizations: Yes    Attends Banker Meetings: More than 4 times per year    Marital Status: Widowed  Intimate Partner Violence: Not At Risk (10/06/2021)   Humiliation, Afraid, Rape, and Kick questionnaire    Fear of Current or Ex-Partner: No    Emotionally Abused: No    Physically Abused: No    Sexually Abused: No    Family History: Family History  Problem Relation Age of Onset   COPD Mother    Diabetes Mother    Pneumonia Father    Alcohol abuse Father    Hypertension Sister    Hypertension Sister    Breast cancer Sister    Hypertension Sister    Hypertension Brother     Diabetes Brother    Hypertension Brother    Hypertension Brother     Current Medications:  Current Outpatient Medications:    aspirin 81 MG EC tablet, Take 81 mg by mouth daily., Disp: , Rfl:    atorvastatin (LIPITOR) 20 MG tablet, Take 1 tablet (20 mg total) by mouth at bedtime., Disp: 90 tablet, Rfl: 3   calcium-vitamin D (OSCAL WITH D) 500-200 MG-UNIT tablet, Take 1 tablet by mouth 2 (two) times daily., Disp: 60 tablet, Rfl: 11   Multiple Vitamins-Minerals (WOMENS MULTIVITAMIN PLUS) TABS, Take 1 tablet by mouth daily., Disp: , Rfl:    torsemide (DEMADEX) 5 MG tablet, Take 5 mg by mouth daily., Disp: , Rfl:    Allergies: Allergies  Allergen Reactions   Chlorthalidone Other (See Comments)    States medication is drunk and dizzy   Amlodipine     All over cramps   Hydralazine Other (See Comments)   Hydrochlorothiazide W-Triamterene     REACTION: cramps   Lisinopril     Dizzy and leg cramps    Spironolactone Other (See Comments)    muscle cramps    REVIEW OF SYSTEMS:   Review of Systems  Constitutional:  Negative for chills, fatigue and fever.  HENT:   Negative for lump/mass, mouth sores, nosebleeds, sore throat and trouble swallowing.   Eyes:  Negative for eye problems.  Respiratory:  Negative for cough and shortness of breath.   Cardiovascular:  Negative for chest pain, leg swelling and palpitations.  Gastrointestinal:  Negative for abdominal pain, constipation, diarrhea, nausea and vomiting.  Genitourinary:  Negative for bladder incontinence, difficulty urinating, dysuria, frequency, hematuria and nocturia.   Musculoskeletal:  Negative for arthralgias, back pain, flank pain, myalgias and neck pain.  Skin:  Negative for itching and rash.  Neurological:  Negative for dizziness, headaches and numbness.  Hematological:  Does not bruise/bleed easily.  Psychiatric/Behavioral:  Negative for depression, sleep disturbance and suicidal ideas. The patient is not nervous/anxious.  All other systems reviewed and are negative.    VITALS:   Blood pressure (!) 223/108, pulse 96, temperature 98 F (36.7 C), temperature source Oral, resp. rate 18, weight 160 lb 1.6 oz (72.6 kg), SpO2 100 %.  Wt Readings from Last 3 Encounters:  07/04/22 160 lb 1.6 oz (72.6 kg)  06/13/22 159 lb (72.1 kg)  02/02/22 163 lb (73.9 kg)    Body mass index is 25.84 kg/m.  Performance status (ECOG): 1 - Symptomatic but completely ambulatory  PHYSICAL EXAM:   Physical Exam Vitals and nursing note reviewed. Exam conducted with a chaperone present.  Constitutional:      Appearance: Normal appearance.  Cardiovascular:     Rate and Rhythm: Normal rate and regular rhythm.     Pulses: Normal pulses.     Heart sounds: Normal heart sounds.  Pulmonary:     Effort: Pulmonary effort is normal.     Breath sounds: Normal breath sounds.  Abdominal:     Palpations: Abdomen is soft. There is no hepatomegaly, splenomegaly or mass.     Tenderness: There is no abdominal tenderness.  Musculoskeletal:     Right lower leg: No edema.     Left lower leg: No edema.  Lymphadenopathy:     Cervical: No cervical adenopathy.     Right cervical: No superficial, deep or posterior cervical adenopathy.    Left cervical: No superficial, deep or posterior cervical adenopathy.     Upper Body:     Right upper body: No supraclavicular or axillary adenopathy.     Left upper body: No supraclavicular or axillary adenopathy.  Neurological:     General: No focal deficit present.     Mental Status: She is alert and oriented to person, place, and time.  Psychiatric:        Mood and Affect: Mood normal.        Behavior: Behavior normal.     LABS:      Latest Ref Rng & Units 06/06/2022    1:34 PM 07/18/2019    1:27 PM 07/23/2018   10:20 AM  CBC  WBC 4.0 - 10.5 K/uL 3.0  4.1  3.7   Hemoglobin 12.0 - 15.0 g/dL 16.1  09.6  04.5   Hematocrit 36.0 - 46.0 % 34.5  35.2  33.4   Platelets 150 - 400 K/uL 167  179  178        Latest Ref Rng & Units 07/04/2022    3:00 PM 07/18/2019    1:27 PM 03/18/2019   10:41 AM  CMP  Glucose 70 - 99 mg/dL 95  93  409   BUN 8 - 23 mg/dL 19  22  17    Creatinine 0.44 - 1.00 mg/dL 8.11  9.14  7.82   Sodium 135 - 145 mmol/L 138  139  142   Potassium 3.5 - 5.1 mmol/L 4.1  5.0  4.0   Chloride 98 - 111 mmol/L 103  103  105   CO2 22 - 32 mmol/L 27  27  29    Calcium 8.9 - 10.3 mg/dL 9.0  95.6  9.3   Total Protein 6.5 - 8.1 g/dL 7.9  8.2    Total Bilirubin 0.3 - 1.2 mg/dL 0.7  0.7    Alkaline Phos 38 - 126 U/L 87     AST 15 - 41 U/L 21  18    ALT 0 - 44 U/L 16  18       No results found for: "  CEA1", "CEA" / No results found for: "CEA1", "CEA" No results found for: "PSA1" No results found for: "CAN199" No results found for: "CAN125"  Lab Results  Component Value Date   TOTALPROTELP 7.7 06/06/2022   TOTALPROTELP 8.0 06/06/2022   ALBUMINELP 4.2 06/06/2022   A1GS 0.3 06/06/2022   A2GS 0.7 06/06/2022   BETS 1.2 06/06/2022   GAMS 1.7 06/06/2022   MSPIKE 1.2 (H) 06/06/2022   SPEI Comment 06/06/2022   Lab Results  Component Value Date   TIBC 331 06/06/2022   FERRITIN 186 06/06/2022   FERRITIN 166 12/24/2015   FERRITIN 277 06/18/2014   IRONPCTSAT 22 06/06/2022   Lab Results  Component Value Date   LDH 163 06/06/2022     STUDIES:   US SPLEEN (ABDOMEN LIMITED)  Result Date: 06/26/2022 CLINICAL DATA:  Pancytopenia EXAM: ULTRASOUND ABDOMEN LIMITED COMPARISON:  None Available. FINDINGS: The spleen measures 5.2 x 2.5 x 4.5 cm with a volume 30 cc. IMPRESSION: The spleen is normal in size. Electronically Signed   By: Gerome Sam III M.D.   On: 06/26/2022 09:48

## 2022-07-06 LAB — BETA 2 MICROGLOBULIN, SERUM: Beta-2 Microglobulin: 2.2 mg/L (ref 0.6–2.4)

## 2022-07-07 ENCOUNTER — Other Ambulatory Visit: Payer: Self-pay

## 2022-07-07 ENCOUNTER — Ambulatory Visit (HOSPITAL_COMMUNITY)
Admission: RE | Admit: 2022-07-07 | Discharge: 2022-07-07 | Disposition: A | Discharge status: Home / Self Care | Payer: Medicare HMO | Source: Ambulatory Visit | Attending: Hematology | Admitting: Hematology

## 2022-07-07 DIAGNOSIS — M47816 Spondylosis without myelopathy or radiculopathy, lumbar region: Secondary | ICD-10-CM | POA: Diagnosis not present

## 2022-07-07 DIAGNOSIS — D472 Monoclonal gammopathy: Secondary | ICD-10-CM | POA: Diagnosis not present

## 2022-07-07 DIAGNOSIS — M47812 Spondylosis without myelopathy or radiculopathy, cervical region: Secondary | ICD-10-CM | POA: Diagnosis not present

## 2022-07-07 DIAGNOSIS — M160 Bilateral primary osteoarthritis of hip: Secondary | ICD-10-CM | POA: Diagnosis not present

## 2022-07-11 LAB — UPEP/UIFE/LIGHT CHAINS/TP, 24-HR UR
Total Protein, Urine-Ur/day: 250 mg/24 hr — ABNORMAL HIGH (ref 30–150)
Total Protein, Urine: 9.6 mg/dL

## 2022-07-12 ENCOUNTER — Other Ambulatory Visit: Payer: Self-pay | Admitting: *Deleted

## 2022-07-12 DIAGNOSIS — D472 Monoclonal gammopathy: Secondary | ICD-10-CM

## 2022-07-12 LAB — UPEP/UIFE/LIGHT CHAINS/TP, 24-HR UR
% BETA, Urine: 49 %
Total Volume: 2600

## 2022-07-12 NOTE — Progress Notes (Signed)
Notified by lab that specimen was left out for an extended period of time and was not an accurate specimen.  Patient notified that it would need to be recollected.  Will obtain new specimen this week.

## 2022-07-13 LAB — UPEP/UIFE/LIGHT CHAINS/TP, 24-HR UR
Alpha 2, Urine: 3.1 %
GAMMA GLOBULIN URINE: 8.6 %

## 2022-07-14 ENCOUNTER — Other Ambulatory Visit: Payer: Self-pay

## 2022-07-14 DIAGNOSIS — E785 Hyperlipidemia, unspecified: Secondary | ICD-10-CM | POA: Diagnosis not present

## 2022-07-14 DIAGNOSIS — D472 Monoclonal gammopathy: Secondary | ICD-10-CM

## 2022-07-14 DIAGNOSIS — D509 Iron deficiency anemia, unspecified: Secondary | ICD-10-CM | POA: Diagnosis not present

## 2022-07-14 DIAGNOSIS — D72819 Decreased white blood cell count, unspecified: Secondary | ICD-10-CM | POA: Diagnosis not present

## 2022-07-14 DIAGNOSIS — D631 Anemia in chronic kidney disease: Secondary | ICD-10-CM | POA: Diagnosis not present

## 2022-07-14 DIAGNOSIS — N189 Chronic kidney disease, unspecified: Secondary | ICD-10-CM | POA: Diagnosis not present

## 2022-07-14 DIAGNOSIS — D61818 Other pancytopenia: Secondary | ICD-10-CM | POA: Diagnosis not present

## 2022-07-18 ENCOUNTER — Inpatient Hospital Stay: Payer: Medicare HMO | Admitting: Oncology

## 2022-07-18 LAB — UPEP/UIFE/LIGHT CHAINS/TP, 24-HR UR
% BETA, Urine: 0 %
ALPHA 1 URINE: 0 %
Albumin, U: 100 %
Alpha 2, Urine: 0 %
Free Kappa Lt Chains,Ur: 2.27 mg/L (ref 1.17–86.46)
Free Kappa/Lambda Ratio: >3.29 (ref 1.83–14.26)
Free Lambda Lt Chains,Ur: <0.69 mg/L (ref 0.27–15.21)
GAMMA GLOBULIN URINE: 0 %
Total Protein, Urine-Ur/day: 120 mg/24 hr (ref 30–150)
Total Protein, Urine: 4.8 mg/dL
Total Volume: 2500

## 2022-07-18 NOTE — Progress Notes (Deleted)
[facility] [address], [place] [address]    Clinic Day:  07/18/2022  Referring physician: Lupita Raider, NP  Patient Care Team: Lupita Raider, NP as PCP - General (Family Medicine) Wyline Mood Dorothe Pea, MD as PCP - Cardiology (Cardiology) Doreatha Massed, MD as Medical Oncologist (Hematology)   ASSESSMENT & PLAN:   Assessment: 1.  Pancytopenia: - Patient seen at the request of Dr. Margo Aye. - 04/19/2022: WBC-2.9 (43 N, 44 L, 10 M, 3E), Hb-10.5, PLT-146, MCV-91, X91-478, folic acid-20, creatinine-1.22 - 01/04/2022: WBC-3, Hb-10.6, PLT-166 - 09/28/2021: WBC-3.1, Hb-10.4, PLT-143 - 03/02/2021: WBC-3.3, Hb-10.7, PLT-146 - Denies recurrent infections.  No B symptoms.  Intentional weight loss 40 pounds in the last 2 to 3 years, stopped drinking soda, eating candy and bread.   2.  Social/family history: - Lives by herself at home and is independent of ADLs and IADLs.  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  Non-smoker.  No chemical exposure. - Sister had breast cancer.  Brother had colon cancer.  No family history of leukemia.    Plan: 1.  IgG kappa MGUS: - Workup for pancytopenia showed M spike of 1.2 g.  Immunofixation shows IgG kappa.  Free light chain ratio is normal at 1.43 and kappa light chains of 17.2. - We have discussed the spectrum of plasma cell disorders in general and MGUS in particular. - Recommend further workup with skeletal survey, beta-2 microglobulin, comprehensive metabolic panel.  Will also check 24-hour urine for total protein, UPEP, urine free light chains and immunofixation. - RTC 2 to 3 weeks for follow-up.  2.  Normocytic anemia: - Combination anemia from CKD and functional iron deficiency. - Reviewed blood work from 06/06/2022: Ferritin 186, percent saturation 22.  B12, folic acid, MMA and copper levels were normal.   3.  Mild leukopenia: - Workup for nutritional deficiencies, connective tissue disorders was negative. -  Ultrasound of the spleen was normal in size. 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  No increase in infection risk.  ANC is 1.4.  Will closely monitor.    No orders of the defined types were placed in this encounter.     I,Sara Haynes,acting as a Neurosurgeon for [employer], NP.,have documented all relevant documentation on the behalf of Sara Kaufmann, NP,as directed by  Sara Kaufmann, NP while in the presence of Sara Kaufmann, NP.   I, Doreatha Massed MD, have reviewed the above documentation for accuracy and completeness, and I agree with the above.   Sara Kaufmann, NP   7/16/20242:44 PM  CHIEF COMPLAINT:   Diagnosis: pancytopenia    Cancer Staging  No matching staging information was found for the patient.    Prior Therapy: none  Current Therapy:  oral iron and vitamin B supplements   HISTORY OF PRESENT ILLNESS:   Oncology History   No history exists.     INTERVAL HISTORY:   Sara Haynes is a 77 y.o. female presenting to clinic today for follow up of pancytopenia. She was last seen by me on 06/05/22 in consultation.  Since her last visit, she underwent spleen US on 06/26/22 showing normal sized spleen.  Today, she states that she is doing well overall. Her appetite level is at 100%. Her energy level is at 100%.  PAST MEDICAL HISTORY:   Past Medical History: Past Medical History:  Diagnosis Date   Abnormal electrocardiogram 02/03/2010   Sinus bradycardia   Anemia    Hyperlipidemia  11/04/2010   Hypertension 1985   Never successfully treated.   Medication intolerance    Tinnitus of both ears     Surgical History: Past Surgical History:  Procedure Laterality Date   ARM SKIN LESION BIOPSY / EXCISION Right    fatty tumor   CATARACT EXTRACTION  01/02/2009   Bilateral   COLONOSCOPY     COLONOSCOPY  09/20/2011   Procedure: COLONOSCOPY;  Surgeon: Malissa Hippo, MD;  Location: [facility];  Service: Endoscopy;  Laterality:  N/A;  930   EYE SURGERY Right 11/03/2010   cataract extraction   EYE SURGERY Left 01/03/2011   cataract extraction   HYSTEROSCOPY WITH D & C N/A 05/13/2013   Procedure: DILATATION AND CURETTAGE /HYSTEROSCOPY;  Surgeon: Tilda Burrow, MD;  Location: [facility];  Service: Gynecology;  Laterality: N/A;   POLYPECTOMY N/A 05/13/2013   Procedure: POLYPECTOMY (removal endometrial polyp);  Surgeon: Tilda Burrow, MD;  Location: [facility];  Service: Gynecology;  Laterality: N/A;   TOOTH EXTRACTION  2021   TUBAL LIGATION  01/03/1975    Social History: Social History   Socioeconomic History   Marital status: Married    Spouse name: Not on file   Number of children: 3   Years of education: Not on file   Highest education level: Not on file  Occupational History   Occupation: Retired  Tobacco Use   Smoking status: Never   Smokeless tobacco: Never  Vaping Use   Vaping status: Never Used  Substance and Sexual Activity   Alcohol use: No   Drug use: No   Sexual activity: Not Currently    Birth control/protection: Post-menopausal  Other Topics Concern   Not on file  Social History Narrative   Not on file   Social Determinants of Health   Financial Resource Strain: Medium Risk (10/06/2021)   Overall Financial Resource Strain (CARDIA)    Difficulty of Paying Living Expenses: Somewhat hard  Food Insecurity: No Food Insecurity (10/06/2021)   Hunger Vital Sign    Worried About Running Out of Food in the Last Year: Never true    Ran Out of Food in the Last Year: Never true  Transportation Needs: No Transportation Needs (10/06/2021)   PRAPARE - Administrator, Civil Service (Medical): No    Lack of Transportation (Non-Medical): No  Physical Activity: Sufficiently Active (10/06/2021)   Exercise Vital Sign    Days of Exercise per Week: 6 days    Minutes of Exercise per Session: 30 min  Stress: No Stress Concern Present (10/06/2021)   [employer] of Occupational Health -  Occupational Stress Questionnaire    Feeling of Stress : Only a little  Social Connections: Moderately Integrated (10/06/2021)   Social Connection and Isolation Panel [NHANES]    Frequency of Communication with Friends and Family: Twice a week    Frequency of Social Gatherings with Friends and Family: Twice a week    Attends Religious Services: More than 4 times per year    Active Member of [employer] or Organizations: Yes    Attends Banker Meetings: More than 4 times per year    Marital Status: Widowed  Intimate Partner Violence: Not At Risk (10/06/2021)   Humiliation, Afraid, Rape, and Kick questionnaire    Fear of Current or Ex-Partner: No    Emotionally Abused: No    Physically Abused: No    Sexually Abused: No    Family History: Family History  Problem Relation Age of  Onset   COPD Mother    Diabetes Mother    Pneumonia Father    Alcohol abuse Father    Hypertension Sister    Hypertension Sister    Breast cancer Sister    Hypertension Sister    Hypertension Brother    Diabetes Brother    Hypertension Brother    Hypertension Brother     Current Medications:  Current Outpatient Medications:    aspirin 81 MG EC tablet, Take 81 mg by mouth daily., Disp: , Rfl:    atorvastatin (LIPITOR) 20 MG tablet, Take 1 tablet (20 mg total) by mouth at bedtime., Disp: 90 tablet, Rfl: 3   calcium-vitamin D (OSCAL WITH D) 500-200 MG-UNIT tablet, Take 1 tablet by mouth 2 (two) times daily., Disp: 60 tablet, Rfl: 11   Multiple Vitamins-Minerals (WOMENS MULTIVITAMIN PLUS) TABS, Take 1 tablet by mouth daily., Disp: , Rfl:    torsemide (DEMADEX) 5 MG tablet, Take 5 mg by mouth daily., Disp: , Rfl:    Allergies: Allergies  Allergen Reactions   Chlorthalidone Other (See Comments)    States medication is drunk and dizzy   Amlodipine     All over cramps   Hydralazine Other (See Comments)   Hydrochlorothiazide W-Triamterene     REACTION: cramps   Lisinopril     Dizzy and leg  cramps    Spironolactone Other (See Comments)    muscle cramps    REVIEW OF SYSTEMS:   Review of Systems  Constitutional:  Negative for chills, fatigue and fever.  HENT:   Negative for lump/mass, mouth sores, nosebleeds, sore throat and trouble swallowing.   Eyes:  Negative for eye problems.  Respiratory:  Negative for cough and shortness of breath.   Cardiovascular:  Negative for chest pain, leg swelling and palpitations.  Gastrointestinal:  Negative for abdominal pain, constipation, diarrhea, nausea and vomiting.  Genitourinary:  Negative for bladder incontinence, difficulty urinating, dysuria, frequency, hematuria and nocturia.   Musculoskeletal:  Negative for arthralgias, back pain, flank pain, myalgias and neck pain.  Skin:  Negative for itching and rash.  Neurological:  Negative for dizziness, headaches and numbness.  Hematological:  Does not bruise/bleed easily.  Psychiatric/Behavioral:  Negative for depression, sleep disturbance and suicidal ideas. The patient is not nervous/anxious.   All other systems reviewed and are negative.    VITALS:   There were no vitals taken for this visit.  Wt Readings from Last 3 Encounters:  07/04/22 160 lb 1.6 oz (72.6 kg)  06/13/22 159 lb (72.1 kg)  02/02/22 163 lb (73.9 kg)    There is no height or weight on file to calculate BMI.  Performance status (ECOG): 1 - Symptomatic but completely ambulatory  PHYSICAL EXAM:   Physical Exam Vitals and nursing note reviewed. Exam conducted with a chaperone present.  Constitutional:      Appearance: Normal appearance.  Cardiovascular:     Rate and Rhythm: Normal rate and regular rhythm.     Pulses: Normal pulses.     Heart sounds: Normal heart sounds.  Pulmonary:     Effort: Pulmonary effort is normal.     Breath sounds: Normal breath sounds.  Abdominal:     Palpations: Abdomen is soft. There is no hepatomegaly, splenomegaly or mass.     Tenderness: There is no abdominal tenderness.   Musculoskeletal:     Right lower leg: No edema.     Left lower leg: No edema.  Lymphadenopathy:     Cervical: No cervical adenopathy.  Right cervical: No superficial, deep or posterior cervical adenopathy.    Left cervical: No superficial, deep or posterior cervical adenopathy.     Upper Body:     Right upper body: No supraclavicular or axillary adenopathy.     Left upper body: No supraclavicular or axillary adenopathy.  Neurological:     General: No focal deficit present.     Mental Status: She is alert and oriented to person, place, and time.  Psychiatric:        Mood and Affect: Mood normal.        Behavior: Behavior normal.     LABS:      Latest Ref Rng & Units 06/06/2022    1:34 PM 07/18/2019    1:27 PM 07/23/2018   10:20 AM  CBC  WBC 4.0 - 10.5 K/uL 3.0  4.1  3.7   Hemoglobin 12.0 - 15.0 g/dL 30.8  65.7  84.6   Hematocrit 36.0 - 46.0 % 34.5  35.2  33.4   Platelets 150 - 400 K/uL 167  179  178       Latest Ref Rng & Units 07/04/2022    3:00 PM 07/18/2019    1:27 PM 03/18/2019   10:41 AM  CMP  Glucose 70 - 99 mg/dL 95  93  962   BUN 8 - 23 mg/dL 19  22  17    Creatinine 0.44 - 1.00 mg/dL 9.52  8.41  3.24   Sodium 135 - 145 mmol/L 138  139  142   Potassium 3.5 - 5.1 mmol/L 4.1  5.0  4.0   Chloride 98 - 111 mmol/L 103  103  105   CO2 22 - 32 mmol/L 27  27  29    Calcium 8.9 - 10.3 mg/dL 9.0  40.1  9.3   Total Protein 6.5 - 8.1 g/dL 7.9  8.2    Total Bilirubin 0.3 - 1.2 mg/dL 0.7  0.7    Alkaline Phos 38 - 126 U/L 87     AST 15 - 41 U/L 21  18    ALT 0 - 44 U/L 16  18       No results found for: "CEA1", "CEA" / No results found for: "CEA1", "CEA" No results found for: "PSA1" No results found for: "CAN199" No results found for: "CAN125"  Lab Results  Component Value Date   TOTALPROTELP 7.7 06/06/2022   TOTALPROTELP 8.0 06/06/2022   ALBUMINELP 4.2 06/06/2022   A1GS 0.3 06/06/2022   A2GS 0.7 06/06/2022   BETS 1.2 06/06/2022   GAMS 1.7 06/06/2022   MSPIKE  1.2 (H) 06/06/2022   SPEI Comment 06/06/2022   Lab Results  Component Value Date   TIBC 331 06/06/2022   FERRITIN 186 06/06/2022   FERRITIN 166 12/24/2015   FERRITIN 277 06/18/2014   IRONPCTSAT 22 06/06/2022   Lab Results  Component Value Date   LDH 163 06/06/2022     STUDIES:   DG Bone Survey Met  Result Date: 07/12/2022 CLINICAL DATA:  MGUS EXAM: METASTATIC BONE SURVEY COMPARISON:  None Available. FINDINGS: Standard skeletal survey was performed: Skull: Lateral view of the skull demonstrates no acute or destructive bony abnormalities. Upper extremities: Frontal views are obtained from the shoulders through the wrists. No acute or destructive bony abnormalities. Spine: Frontal and lateral views demonstrate no acute or destructive bony abnormalities. Moderate spondylosis from C3 through T1, as well as at the lumbosacral junction. Chest: No acute or destructive bony abnormalities. No acute intrathoracic process. Pelvis: Frontal  view of the pelvis and bilateral hips demonstrates no acute or destructive bony abnormality. Mild symmetrical bilateral hip osteoarthritis. Lower extremities: Frontal views are obtained from the hips through the ankles. No acute or destructive bony abnormalities. IMPRESSION: 1. No acute or destructive bony abnormalities. 2. Degenerative changes of the cervical and lumbar spine. Electronically Signed   By: Sharlet Salina M.D.   On: 07/12/2022 16:47   US SPLEEN (ABDOMEN LIMITED)  Result Date: 06/26/2022 CLINICAL DATA:  Pancytopenia EXAM: ULTRASOUND ABDOMEN LIMITED COMPARISON:  None Available. FINDINGS: The spleen measures 5.2 x 2.5 x 4.5 cm with a volume 30 cc. IMPRESSION: The spleen is normal in size. Electronically Signed   By: Gerome Sam III M.D.   On: 06/26/2022 09:48

## 2022-08-02 ENCOUNTER — Inpatient Hospital Stay (HOSPITAL_BASED_OUTPATIENT_CLINIC_OR_DEPARTMENT_OTHER): Payer: Medicare HMO | Admitting: Oncology

## 2022-08-02 VITALS — BP 235/105 | HR 86 | Temp 98.2°F | Resp 18 | Ht 66.0 in | Wt 162.8 lb

## 2022-08-02 DIAGNOSIS — E785 Hyperlipidemia, unspecified: Secondary | ICD-10-CM | POA: Diagnosis not present

## 2022-08-02 DIAGNOSIS — D72819 Decreased white blood cell count, unspecified: Secondary | ICD-10-CM | POA: Diagnosis not present

## 2022-08-02 DIAGNOSIS — D61818 Other pancytopenia: Secondary | ICD-10-CM

## 2022-08-02 DIAGNOSIS — D631 Anemia in chronic kidney disease: Secondary | ICD-10-CM | POA: Diagnosis not present

## 2022-08-02 DIAGNOSIS — D472 Monoclonal gammopathy: Secondary | ICD-10-CM | POA: Diagnosis not present

## 2022-08-02 DIAGNOSIS — D509 Iron deficiency anemia, unspecified: Secondary | ICD-10-CM | POA: Diagnosis not present

## 2022-08-02 DIAGNOSIS — N189 Chronic kidney disease, unspecified: Secondary | ICD-10-CM | POA: Diagnosis not present

## 2022-08-02 NOTE — Progress Notes (Signed)
[facility] [address] [place] [address]    Clinic Day:  08/02/2022  Referring physician: Lupita Raider, NP  Patient Care Team: Lupita Raider, NP as PCP - General (Family Medicine) Wyline Mood Dorothe Pea, MD as PCP - Cardiology (Cardiology) Doreatha Massed, MD as Medical Oncologist (Hematology)   ASSESSMENT & PLAN:   Assessment: 1.  Pancytopenia: - Patient seen at the request of Dr. Margo Aye. - 04/19/2022: WBC-2.9 (43 N, 44 L, 10 M, 3E), Hb-10.5, PLT-146, MCV-91, U04-540, folic acid-20, creatinine-1.22 - 01/04/2022: WBC-3, Hb-10.6, PLT-166 - 09/28/2021: WBC-3.1, Hb-10.4, PLT-143 - 03/02/2021: WBC-3.3, Hb-10.7, PLT-146 - Denies recurrent infections.  No B symptoms.  Intentional weight loss 40 pounds in the last 2 to 3 years, stopped drinking soda, eating candy and bread.   2.  Social/family history: - Lives by herself at home and is independent of ADLs and IADLs.  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  Non-smoker.  No chemical exposure. - Sister had breast cancer.  Brother had colon cancer.  No family history of leukemia.    Plan: 1.  IgG kappa MGUS: - Workup for pancytopenia on 06/06/2022 revealed M spike of 1.2 g.  Immunofixation shows IgG kappa.  Free light chain ratio is normal at 1.43 and L light chains of 17.2. -Further workup from 07/04/22 shows beta-2 microglobulin WNL at 2.2.  Bone survey showed no acute or destructive bony abnormalities.  Mild degenerative changes of the cervical and lumbar spine. -24-hour urine did show faint IgG light chain specificity without evidence of M spike.  Recommend repeating in 4 to 6 months.  2.  Normocytic anemia: - Combination anemia from CKD and functional iron deficiency. - Reviewed blood work from 06/06/2022: Ferritin 186, percent saturation 22.  B12, folic acid, MMA and copper levels were normal.   3.  Mild leukopenia: - Workup for nutritional deficiencies, connective tissue disorders was negative. -  Ultrasound of the spleen was normal in size. 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  No increase in infection risk.  ANC is 1.4.  Will closely monitor.  PLAN SUMMARY: >> RTC in 6 months for lab work and see NP for a telephone call a few days later.      I spent 20 minutes dedicated to the care of this patient (face-to-face and non-face-to-face) on the date of the encounter to include what is described in the assessment and plan.   No orders of the defined types were placed in this encounter.    Mauro Kaufmann, NP   7/31/20242:33 PM  CHIEF COMPLAINT:   Diagnosis: pancytopenia    Cancer Staging  No matching staging information was found for the patient.    Prior Therapy: none  Current Therapy:  oral iron and vitamin B supplements   HISTORY OF PRESENT ILLNESS:   Oncology History   No history exists.     INTERVAL HISTORY:   Sara Haynes is a 77 y.o. female presenting to clinic today for follow up of pancytopenia. She was last seen by Dr. Ellin Saba on 07/04/22.  Since her last visit, she had a bone survey and additional blood work and she is here to discuss results.  Appetite is 90% energy level is 80%.  Denies any pain.  Has occasional dizziness and anxiety at times but overall feels well.  PAST MEDICAL HISTORY:   Past Medical History: Past Medical History:  Diagnosis Date   Abnormal electrocardiogram 02/03/2010   Sinus bradycardia   Anemia  Hyperlipidemia 11/04/2010   Hypertension 1985   Never successfully treated.   Medication intolerance    Tinnitus of both ears     Surgical History: Past Surgical History:  Procedure Laterality Date   ARM SKIN LESION BIOPSY / EXCISION Right    fatty tumor   CATARACT EXTRACTION  01/02/2009   Bilateral   COLONOSCOPY     COLONOSCOPY  09/20/2011   Procedure: COLONOSCOPY;  Surgeon: Malissa Hippo, MD;  [facility];  Service: Endoscopy;  Laterality: N/A;  930   EYE SURGERY Right 11/03/2010    cataract extraction   EYE SURGERY Left 01/03/2011   cataract extraction   HYSTEROSCOPY WITH D & C N/A 05/13/2013   Procedure: DILATATION AND CURETTAGE /HYSTEROSCOPY;  Surgeon: Tilda Burrow, MD;  Location: [facility];  Service: Gynecology;  Laterality: N/A;   POLYPECTOMY N/A 05/13/2013   Procedure: POLYPECTOMY (removal endometrial polyp);  Surgeon: Tilda Burrow, MD;  Location: [facility];  Service: Gynecology;  Laterality: N/A;   TOOTH EXTRACTION  2021   TUBAL LIGATION  01/03/1975    Social History: Social History   Socioeconomic History   Marital status: Married    Spouse name: Not on file   Number of children: 3   Years of education: Not on file   Highest education level: Not on file  Occupational History   Occupation: Retired  Tobacco Use   Smoking status: Never   Smokeless tobacco: Never  Vaping Use   Vaping status: Never Used  Substance and Sexual Activity   Alcohol use: No   Drug use: No   Sexual activity: Not Currently    Birth control/protection: Post-menopausal  Other Topics Concern   Not on file  Social History Narrative   Not on file   Social Determinants of Health   Financial Resource Strain: Medium Risk (10/06/2021)   Overall Financial Resource Strain (CARDIA)    Difficulty of Paying Living Expenses: Somewhat hard  Food Insecurity: No Food Insecurity (10/06/2021)   Hunger Vital Sign    Worried About Running Out of Food in the Last Year: Never true    Ran Out of Food in the Last Year: Never true  Transportation Needs: No Transportation Needs (10/06/2021)   PRAPARE - Administrator, Civil Service (Medical): No    Lack of Transportation (Non-Medical): No  Physical Activity: Sufficiently Active (10/06/2021)   Exercise Vital Sign    Days of Exercise per Week: 6 days    Minutes of Exercise per Session: 30 min  Stress: No Stress Concern Present (10/06/2021)   [employer] of Occupational Health - Occupational Stress Questionnaire    Feeling of  Stress : Only a little  Social Connections: Moderately Integrated (10/06/2021)   Social Connection and Isolation Panel [NHANES]    Frequency of Communication with Friends and Family: Twice a week    Frequency of Social Gatherings with Friends and Family: Twice a week    Attends Religious Services: More than 4 times per year    Active Member of [employer] or Organizations: Yes    Attends Banker Meetings: More than 4 times per year    Marital Status: Widowed  Intimate Partner Violence: Not At Risk (10/06/2021)   Humiliation, Afraid, Rape, and Kick questionnaire    Fear of Current or Ex-Partner: No    Emotionally Abused: No    Physically Abused: No    Sexually Abused: No    Family History: Family History  Problem Relation Age  of Onset   COPD Mother    Diabetes Mother    Pneumonia Father    Alcohol abuse Father    Hypertension Sister    Hypertension Sister    Breast cancer Sister    Hypertension Sister    Hypertension Brother    Diabetes Brother    Hypertension Brother    Hypertension Brother     Current Medications:  Current Outpatient Medications:    aspirin 81 MG EC tablet, Take 81 mg by mouth daily., Disp: , Rfl:    atorvastatin (LIPITOR) 20 MG tablet, Take 1 tablet (20 mg total) by mouth at bedtime., Disp: 90 tablet, Rfl: 3   calcium-vitamin D (OSCAL WITH D) 500-200 MG-UNIT tablet, Take 1 tablet by mouth 2 (two) times daily., Disp: 60 tablet, Rfl: 11   Multiple Vitamins-Minerals (WOMENS MULTIVITAMIN PLUS) TABS, Take 1 tablet by mouth daily., Disp: , Rfl:    torsemide (DEMADEX) 5 MG tablet, Take 5 mg by mouth daily., Disp: , Rfl:    Allergies: Allergies  Allergen Reactions   Chlorthalidone Other (See Comments)    States medication is drunk and dizzy   Amlodipine     All over cramps   Hydralazine Other (See Comments)   Hydrochlorothiazide W-Triamterene     REACTION: cramps   Lisinopril     Dizzy and leg cramps    Spironolactone Other (See Comments)     muscle cramps    REVIEW OF SYSTEMS:   Review of Systems  Neurological:  Positive for dizziness.  Psychiatric/Behavioral:  The patient is nervous/anxious.      VITALS:   There were no vitals taken for this visit.  Wt Readings from Last 3 Encounters:  07/04/22 160 lb 1.6 oz (72.6 kg)  06/13/22 159 lb (72.1 kg)  02/02/22 163 lb (73.9 kg)    There is no height or weight on file to calculate BMI.  Performance status (ECOG): 1 - Symptomatic but completely ambulatory  PHYSICAL EXAM:   Physical Exam Constitutional:      Appearance: Normal appearance.  Cardiovascular:     Rate and Rhythm: Normal rate and regular rhythm.  Pulmonary:     Effort: Pulmonary effort is normal.     Breath sounds: Normal breath sounds.  Abdominal:     General: Bowel sounds are normal.     Palpations: Abdomen is soft.  Musculoskeletal:        General: No swelling. Normal range of motion.  Neurological:     Mental Status: She is alert and oriented to person, place, and time. Mental status is at baseline.     LABS:      Latest Ref Rng & Units 06/06/2022    1:34 PM 07/18/2019    1:27 PM 07/23/2018   10:20 AM  CBC  WBC 4.0 - 10.5 K/uL 3.0  4.1  3.7   Hemoglobin 12.0 - 15.0 g/dL 16.1  09.6  04.5   Hematocrit 36.0 - 46.0 % 34.5  35.2  33.4   Platelets 150 - 400 K/uL 167  179  178       Latest Ref Rng & Units 07/04/2022    3:00 PM 07/18/2019    1:27 PM 03/18/2019   10:41 AM  CMP  Glucose 70 - 99 mg/dL 95  93  409   BUN 8 - 23 mg/dL 19  22  17    Creatinine 0.44 - 1.00 mg/dL 8.11  9.14  7.82   Sodium 135 - 145 mmol/L 138  139  142   Potassium 3.5 - 5.1 mmol/L 4.1  5.0  4.0   Chloride 98 - 111 mmol/L 103  103  105   CO2 22 - 32 mmol/L 27  27  29    Calcium 8.9 - 10.3 mg/dL 9.0  16.1  9.3   Total Protein 6.5 - 8.1 g/dL 7.9  8.2    Total Bilirubin 0.3 - 1.2 mg/dL 0.7  0.7    Alkaline Phos 38 - 126 U/L 87     AST 15 - 41 U/L 21  18    ALT 0 - 44 U/L 16  18       No results found for: "CEA1",  "CEA" / No results found for: "CEA1", "CEA" No results found for: "PSA1" No results found for: "WRU045" No results found for: "CAN125"  Lab Results  Component Value Date   TOTALPROTELP 7.7 06/06/2022   TOTALPROTELP 8.0 06/06/2022   ALBUMINELP 4.2 06/06/2022   A1GS 0.3 06/06/2022   A2GS 0.7 06/06/2022   BETS 1.2 06/06/2022   GAMS 1.7 06/06/2022   MSPIKE 1.2 (H) 06/06/2022   SPEI Comment 06/06/2022   Lab Results  Component Value Date   TIBC 331 06/06/2022   FERRITIN 186 06/06/2022   FERRITIN 166 12/24/2015   FERRITIN 277 06/18/2014   IRONPCTSAT 22 06/06/2022   Lab Results  Component Value Date   LDH 163 06/06/2022     STUDIES:   DG Bone Survey Met  Result Date: 07/12/2022 CLINICAL DATA:  MGUS EXAM: METASTATIC BONE SURVEY COMPARISON:  None Available. FINDINGS: Standard skeletal survey was performed: Skull: Lateral view of the skull demonstrates no acute or destructive bony abnormalities. Upper extremities: Frontal views are obtained from the shoulders through the wrists. No acute or destructive bony abnormalities. Spine: Frontal and lateral views demonstrate no acute or destructive bony abnormalities. Moderate spondylosis from C3 through T1, as well as at the lumbosacral junction. Chest: No acute or destructive bony abnormalities. No acute intrathoracic process. Pelvis: Frontal view of the pelvis and bilateral hips demonstrates no acute or destructive bony abnormality. Mild symmetrical bilateral hip osteoarthritis. Lower extremities: Frontal views are obtained from the hips through the ankles. No acute or destructive bony abnormalities. IMPRESSION: 1. No acute or destructive bony abnormalities. 2. Degenerative changes of the cervical and lumbar spine. Electronically Signed   By: Sharlet Salina M.D.   On: 07/12/2022 16:47

## 2022-08-22 DIAGNOSIS — I10 Essential (primary) hypertension: Secondary | ICD-10-CM | POA: Diagnosis not present

## 2022-08-22 DIAGNOSIS — R7301 Impaired fasting glucose: Secondary | ICD-10-CM | POA: Diagnosis not present

## 2022-08-28 DIAGNOSIS — E785 Hyperlipidemia, unspecified: Secondary | ICD-10-CM | POA: Diagnosis not present

## 2022-08-28 DIAGNOSIS — N184 Chronic kidney disease, stage 4 (severe): Secondary | ICD-10-CM | POA: Diagnosis not present

## 2022-08-28 DIAGNOSIS — Z6826 Body mass index (BMI) 26.0-26.9, adult: Secondary | ICD-10-CM | POA: Diagnosis not present

## 2022-08-28 DIAGNOSIS — E663 Overweight: Secondary | ICD-10-CM | POA: Diagnosis not present

## 2022-08-28 DIAGNOSIS — I10 Essential (primary) hypertension: Secondary | ICD-10-CM | POA: Diagnosis not present

## 2022-08-28 DIAGNOSIS — N183 Chronic kidney disease, stage 3 unspecified: Secondary | ICD-10-CM | POA: Diagnosis not present

## 2022-08-28 DIAGNOSIS — G720 Drug-induced myopathy: Secondary | ICD-10-CM | POA: Diagnosis not present

## 2022-08-28 DIAGNOSIS — I129 Hypertensive chronic kidney disease with stage 1 through stage 4 chronic kidney disease, or unspecified chronic kidney disease: Secondary | ICD-10-CM | POA: Diagnosis not present

## 2022-08-28 DIAGNOSIS — D631 Anemia in chronic kidney disease: Secondary | ICD-10-CM | POA: Diagnosis not present

## 2022-08-28 DIAGNOSIS — N814 Uterovaginal prolapse, unspecified: Secondary | ICD-10-CM | POA: Diagnosis not present

## 2022-08-28 DIAGNOSIS — R7301 Impaired fasting glucose: Secondary | ICD-10-CM | POA: Diagnosis not present

## 2022-08-28 DIAGNOSIS — D696 Thrombocytopenia, unspecified: Secondary | ICD-10-CM | POA: Diagnosis not present

## 2022-10-16 ENCOUNTER — Encounter: Payer: Self-pay | Admitting: Obstetrics & Gynecology

## 2022-10-16 ENCOUNTER — Ambulatory Visit (INDEPENDENT_AMBULATORY_CARE_PROVIDER_SITE_OTHER): Payer: Medicare HMO | Admitting: Obstetrics & Gynecology

## 2022-10-16 VITALS — BP 225/106 | HR 103 | Ht 66.0 in | Wt 165.0 lb

## 2022-10-16 DIAGNOSIS — N8110 Cystocele, unspecified: Secondary | ICD-10-CM

## 2022-10-16 DIAGNOSIS — Z4689 Encounter for fitting and adjustment of other specified devices: Secondary | ICD-10-CM | POA: Diagnosis not present

## 2022-10-16 DIAGNOSIS — I10 Essential (primary) hypertension: Secondary | ICD-10-CM

## 2022-10-16 NOTE — Progress Notes (Signed)
Chief Complaint  Patient presents with   Pessary Check    Blood pressure (!) 225/106, pulse (!) 103, height 5\' 6"  (1.676 m), weight 165 lb (74.8 kg).  Sara Haynes presents today for routine follow up related to her pessary.   She uses a Gelhorn 2 1/2 inch pessary She reports no vaginal discharge and no vaginal bleeding   Likert scale(1 not bothersome -5 very bothersome)  :  1  Exam reveals no undue vaginal mucosal pressure of breakdown, no discharge and no vaginal bleeding.  Vaginal Epithelial Abnormality Classification System:   0 0    No abnormalities 1    Epithelial erythema 2    Granulation tissue 3    Epithelial break or erosion, 1 cm or less 4    Epithelial break or erosion, 1 cm or greater  The pessary is removed, cleaned and replaced without difficulty.      ICD-10-CM   1. Pessary maintenance: Gelhorn 2 1/2 inch placed 10/26/21  Z46.89     2. Pelvic organ prolapse quantification stage 3 cystocele  N81.10     3. White coat syndrome with diagnosis of hypertension  I10    BP at home prior to visit was 150/70s       Sara Haynes will be sen back in 4 months for continued follow up.  Lazaro Arms, MD  10/16/2022 2:32 PM

## 2022-10-18 ENCOUNTER — Ambulatory Visit: Payer: Medicare HMO | Attending: Internal Medicine | Admitting: Internal Medicine

## 2022-10-18 ENCOUNTER — Encounter: Payer: Self-pay | Admitting: Internal Medicine

## 2022-10-18 NOTE — Progress Notes (Signed)
Erroneous encounter - please disregard.

## 2023-02-07 ENCOUNTER — Other Ambulatory Visit: Payer: Self-pay

## 2023-02-07 DIAGNOSIS — D472 Monoclonal gammopathy: Secondary | ICD-10-CM

## 2023-02-07 DIAGNOSIS — D61818 Other pancytopenia: Secondary | ICD-10-CM

## 2023-02-08 ENCOUNTER — Inpatient Hospital Stay: Payer: Medicare HMO | Attending: Family Medicine

## 2023-02-15 ENCOUNTER — Inpatient Hospital Stay: Payer: Medicare HMO | Admitting: Oncology

## 2023-02-19 ENCOUNTER — Ambulatory Visit: Payer: Medicare HMO | Admitting: Obstetrics & Gynecology

## 2023-02-19 ENCOUNTER — Encounter: Payer: Self-pay | Admitting: Obstetrics & Gynecology

## 2023-02-19 VITALS — BP 240/140 | HR 84 | Ht 66.0 in | Wt 164.0 lb

## 2023-02-19 DIAGNOSIS — N8110 Cystocele, unspecified: Secondary | ICD-10-CM

## 2023-02-19 DIAGNOSIS — I10 Essential (primary) hypertension: Secondary | ICD-10-CM

## 2023-02-19 DIAGNOSIS — Z4689 Encounter for fitting and adjustment of other specified devices: Secondary | ICD-10-CM

## 2023-02-19 NOTE — Progress Notes (Signed)
 Chief Complaint  Patient presents with   pessary maintenance    Blood pressure (!) 227/130, pulse 95, height 5\' 6"  (1.676 m), weight 164 lb (74.4 kg).  Sara Haynes presents today for routine follow up related to her pessary.   She uses a Gelhorn 2 1/2 inch She reports no vaginal discharge and no vaginal bleeding   Likert scale(1 not bothersome -5 very bothersome)  :  1  Exam reveals no undue vaginal mucosal pressure of breakdown, no discharge and no vaginal bleeding.  Vaginal Epithelial Abnormality Classification System:   0 0    No abnormalities 1    Epithelial erythema 2    Granulation tissue 3    Epithelial break or erosion, 1 cm or less 4    Epithelial break or erosion, 1 cm or greater  The pessary is removed, cleaned and replaced without difficulty.      ICD-10-CM   1. Pessary maintenance: Gelhorn 2 1/2 inch placed 10/26/21  Z46.89     2. Pelvic organ prolapse quantification stage 3 cystocele  N81.10     3. White coat syndrome with diagnosis of hypertension: Home BPs 150/70s, PCP is aware  I10        [name] will be sen back in 4 months for continued follow up.  Lazaro Arms, MD  02/19/2023 9:18 AM

## 2023-03-02 DIAGNOSIS — H5201 Hypermetropia, right eye: Secondary | ICD-10-CM | POA: Diagnosis not present

## 2023-06-11 ENCOUNTER — Ambulatory Visit: Admitting: Obstetrics & Gynecology

## 2023-06-11 ENCOUNTER — Encounter: Payer: Self-pay | Admitting: Obstetrics & Gynecology

## 2023-06-11 VITALS — BP 232/102 | Ht 65.0 in | Wt 152.0 lb

## 2023-06-11 DIAGNOSIS — Z960 Presence of urogenital implants: Secondary | ICD-10-CM

## 2023-06-11 DIAGNOSIS — N8110 Cystocele, unspecified: Secondary | ICD-10-CM

## 2023-06-11 DIAGNOSIS — I10 Essential (primary) hypertension: Secondary | ICD-10-CM | POA: Diagnosis not present

## 2023-06-11 DIAGNOSIS — Z4689 Encounter for fitting and adjustment of other specified devices: Secondary | ICD-10-CM

## 2023-06-11 NOTE — Progress Notes (Signed)
 No chief complaint on file.   Blood pressure (!) 232/102, height 5\' 5"  (1.651 m), weight 152 lb (68.9 kg).  Sara Haynes presents today for routine follow up related to her pessary.   She uses a Gelhorn 2 1/2 inch She reports no vaginal discharge and no vaginal bleeding   Likert scale(1 not bothersome -5 very bothersome)  :  1  Exam reveals no undue vaginal mucosal pressure of breakdown, no discharge and no vaginal bleeding.  Vaginal Epithelial Abnormality Classification System:   0 0    No abnormalities 1    Epithelial erythema 2    Granulation tissue 3    Epithelial break or erosion, 1 cm or less 4    Epithelial break or erosion, 1 cm or greater  The pessary is removed, cleaned and replaced without difficulty.      ICD-10-CM   1. Pessary maintenance: Gelhorn 2 1/2 inch placed 10/26/21  Z46.89     2. Pelvic organ prolapse quantification stage 3 cystocele  N81.10     3. White coat syndrome with diagnosis of hypertension: Home BPs 150/70s, PCP is aware  I10        Sara Haynes will be sen back in 4 months for continued follow up.  Wendelyn Halter, MD  06/11/2023 11:50 AM

## 2023-06-20 ENCOUNTER — Ambulatory Visit: Admitting: Obstetrics & Gynecology

## 2023-07-19 ENCOUNTER — Ambulatory Visit: Attending: Internal Medicine | Admitting: Internal Medicine

## 2023-07-19 VITALS — BP 225/136 | HR 83 | Ht 65.0 in | Wt 160.0 lb

## 2023-07-19 DIAGNOSIS — I10 Essential (primary) hypertension: Secondary | ICD-10-CM

## 2023-07-19 MED ORDER — NIFEDIPINE ER OSMOTIC RELEASE 60 MG PO TB24
60.0000 mg | ORAL_TABLET | Freq: Every day | ORAL | 1 refills | Status: AC
Start: 2023-07-19 — End: ?

## 2023-07-19 NOTE — Patient Instructions (Addendum)
 Medication Instructions:  Begin Nifedipine  60mg . Take this table once daily.   *If you need a refill on your cardiac medications before your next appointment, please call your pharmacy*   Lab Work: No labs were ordered during today's visit.  If you have labs (blood work) drawn today and your tests are completely normal, you will receive your results only by: MyChart Message (if you have MyChart) OR A paper copy in the mail If you have any lab test that is abnormal or we need to change your treatment, we will call you to review the results.   Testing/Procedures: No procedures were ordered during today's visit.    Follow-Up: At [facility], you and your health needs are our priority.  As part of our continuing mission to provide you with exceptional heart care, we have created designated Provider Care Teams.  These Care Teams include your primary Cardiologist (physician) and Advanced Practice Providers (APPs -  Physician Assistants and Nurse Practitioners) who all work together to provide you with the care you need, when you need it.  We recommend signing up for the patient portal called MyChart.  Sign up information is provided on this After Visit Summary.  MyChart is used to connect with patients for Virtual Visits (Telemedicine).  Patients are able to view lab/test results, encounter notes, upcoming appointments, etc.  Non-urgent messages can be sent to your provider as well.   To learn more about what you can do with MyChart, go to [email or web address].    Your next appointment:   3 month(s)  Provider:   You will see one of the following Advanced Practice Providers on your designated Care Team:   Laymon Qua, PA-C  [place], [place] Olivia Pavy, [place]       Other Instructions Thank you for choosing [facility]!

## 2023-07-19 NOTE — Progress Notes (Signed)
 Cardiology Office Note  Date: 07/19/2023   ID: Sara Haynes, Sara Haynes Aug 14, 1945, MRN [id number]  PCP:  Hyacinth Honey, NP  Cardiologist:  Alvan Carrier, MD Electrophysiologist:  None   Reason for Office Visit: HTN follow-up   History of Present Illness: Sara Haynes is a 78 y.o. female known to have HTN, HLD presented to the cardiology clinic for follow-up visit.   Home blood pressures range from 140 to 190 mmHg SBP.  Clinic BP 225/136 mm Hg.  Stop taking all the medications.  Stop taking all medications.  Intolerant to multiple antihypertensive medications.  Does not have any symptoms of angina or DOE.  No headaches, blurred vision, dizziness, syncope, palpitations or leg swelling.   Past Medical History:  Diagnosis Date   Abnormal electrocardiogram 02/03/2010   Sinus bradycardia   Anemia    Hyperlipidemia 11/04/2010   Hypertension 1985   Never successfully treated.   Medication intolerance    Tinnitus of both ears     Past Surgical History:  Procedure Laterality Date   ARM SKIN LESION BIOPSY / EXCISION Right    fatty tumor   CATARACT EXTRACTION  01/02/2009   Bilateral   COLONOSCOPY     COLONOSCOPY  09/20/2011   Procedure: COLONOSCOPY;  Surgeon: Claudis RAYMOND Rivet, MD;  [facility];  Service: Endoscopy;  Laterality: N/A;  930   EYE SURGERY Right 11/03/2010   cataract extraction   EYE SURGERY Left 01/03/2011   cataract extraction   HYSTEROSCOPY WITH D & C N/A 05/13/2013   Procedure: DILATATION AND CURETTAGE /HYSTEROSCOPY;  Surgeon: Norleen LULLA Server, MD;  Location: [facility];  Service: Gynecology;  Laterality: N/A;   POLYPECTOMY N/A 05/13/2013   Procedure: POLYPECTOMY (removal endometrial polyp);  Surgeon: Norleen LULLA Server, MD;  Location: [facility];  Service: Gynecology;  Laterality: N/A;   TOOTH EXTRACTION  2021   TUBAL LIGATION  01/03/1975    Current Outpatient Medications  Medication Sig Dispense Refill   aspirin 81 MG EC tablet Take 81 mg by mouth  daily. (Patient not taking: Reported on 06/11/2023)     atorvastatin  (LIPITOR) 20 MG tablet Take 1 tablet (20 mg total) by mouth at bedtime. 90 tablet 3   calcium -vitamin D  (OSCAL WITH D) 500-200 MG-UNIT tablet Take 1 tablet by mouth 2 (two) times daily. 60 tablet 11   Multiple Vitamins-Minerals (WOMENS MULTIVITAMIN PLUS) TABS Take 1 tablet by mouth daily.     torsemide (DEMADEX) 5 MG tablet Take 5 mg by mouth daily. (Patient not taking: Reported on 06/11/2023)     No current facility-administered medications for this visit.   Allergies:  Chlorthalidone , Amlodipine , Hydralazine , Hydrochlorothiazide -triamterene , Lisinopril , and Spironolactone    Social History: The patient  reports that she has never smoked. She has never used smokeless tobacco. She reports that she does not drink alcohol and does not use drugs.   Family History: The patient's family history includes Alcohol abuse in her father; Breast cancer in her sister; COPD in her mother; Diabetes in her brother and mother; Hypertension in her brother, brother, brother, sister, sister, and sister; Pneumonia in her father.   ROS:  Please see the history of present illness. Otherwise, complete review of systems is positive for none.  All other systems are reviewed and negative.   Physical Exam: VS:  There were no vitals taken for this visit., BMI There is no height or weight on file to calculate BMI.  Wt Readings from Last 3 Encounters:  06/11/23 152 lb (68.9  kg)  02/19/23 164 lb (74.4 kg)  10/16/22 165 lb (74.8 kg)    General: Patient appears comfortable at rest. HEENT: Conjunctiva and lids normal, oropharynx clear with moist mucosa. Neck: Supple, no elevated JVP or carotid bruits, no thyromegaly. Lungs: Clear to auscultation, nonlabored breathing at rest. Cardiac: Regular rate and rhythm, no S3 or significant systolic murmur, no pericardial rub. Abdomen: Soft, nontender, no hepatomegaly, bowel sounds present, no guarding or  rebound. Extremities: No pitting edema, distal pulses 2+. Skin: Warm and dry. Musculoskeletal: No kyphosis. Neuropsychiatric: Alert and oriented x3, affect grossly appropriate.  ECG:  An ECG dated 10/25/21 was personally reviewed today and demonstrated:  NSR  Recent Labwork: No results found for requested labs within last 365 days.     Component Value Date/Time   CHOL 207 (H) 07/18/2019 1327   TRIG 189 (H) 07/18/2019 1327   HDL 53 07/18/2019 1327   CHOLHDL 3.9 07/18/2019 1327   VLDL 24 12/23/2015 1250   LDLCALC 124 (H) 07/18/2019 1327    Other Studies Reviewed Today: Echo in 10/2019  1. Left ventricular ejection fraction, by estimation, is 60 to 65%. The  left ventricle has normal function. The left ventricle has no regional  wall motion abnormalities. Left ventricular diastolic parameters are  indeterminate.   2. Right ventricular systolic function is normal. The right ventricular  size is normal. There is normal pulmonary artery systolic pressure. The  estimated right ventricular systolic pressure is 25.3 mmHg.   3. The mitral valve is grossly normal. No evidence of mitral valve  regurgitation.   4. The aortic valve is tricuspid. Aortic valve regurgitation is mild.  Mild aortic valve sclerosis is present, with no evidence of aortic valve  stenosis.   5. The inferior vena cava is normal in size with greater than 50%  respiratory variability, suggesting right atrial pressure of 3 mmHg.   Assessment and Plan:  HTN, poorly controlled Whitecoat HTN - Home blood pressures range from 140 to 190 mmHg SBP.  Clinic BP 225/136 mm Hg.  Stop taking all the medications. - Intolerant to multiple antihypertensive medications in the past - Did not tolerate amlodipine , hydralazine , HCTZ-triamterene , lisinopril , chlorthalidone , spironolactone . - Start nifedipine  60 mg once daily. - Check BP in a.m. and p.m., keep a log and bring it to the next appointment.   I have spent a total of 30  minutes with patient reviewing chart, EKGs, labs and examining patient as well as establishing an assessment and plan that was discussed with the patient.  > 50% of time was spent in direct patient care.     Medication Adjustments/Labs and Tests Ordered: Current medicines are reviewed at length with the patient today.  Concerns regarding medicines are outlined above.   Tests Ordered: No orders of the defined types were placed in this encounter.   Medication Changes: No orders of the defined types were placed in this encounter.   Disposition:  Follow up 3 months with APP  Signed, Ravyn Nikkel Arleta [place], MD, 07/19/2023 8:42 AM    [facility] at [facility] [address] [place], [place] [address]

## 2023-10-10 ENCOUNTER — Ambulatory Visit: Admitting: Obstetrics & Gynecology

## 2023-10-10 ENCOUNTER — Encounter: Payer: Self-pay | Admitting: Obstetrics & Gynecology

## 2023-10-10 VITALS — BP 247/134 | HR 102 | Ht 65.0 in | Wt 163.0 lb

## 2023-10-10 DIAGNOSIS — N8110 Cystocele, unspecified: Secondary | ICD-10-CM

## 2023-10-10 DIAGNOSIS — Z4689 Encounter for fitting and adjustment of other specified devices: Secondary | ICD-10-CM | POA: Diagnosis not present

## 2023-10-10 DIAGNOSIS — I10 Essential (primary) hypertension: Secondary | ICD-10-CM | POA: Diagnosis not present

## 2023-10-10 NOTE — Progress Notes (Signed)
 Chief Complaint  Patient presents with   Pessary Check    Blood pressure (!) 247/134, pulse (!) 102, height 5' 5 (1.651 m), weight 163 lb (73.9 kg).  Sara Haynes presents today for routine follow up related to her pessary.   She uses a Gelhorn 2 1/2 inch She reports no vaginal discharge and no vaginal bleeding   Likert scale(1 not bothersome -5 very bothersome)  :  1  Exam reveals no undue vaginal mucosal pressure of breakdown, no discharge and no vaginal bleeding.  Vaginal Epithelial Abnormality Classification System:   0 0    No abnormalities 1    Epithelial erythema 2    Granulation tissue 3    Epithelial break or erosion, 1 cm or less 4    Epithelial break or erosion, 1 cm or greater  The pessary is removed, cleaned and replaced without difficulty.      ICD-10-CM   1. Pessary maintenance: Gelhorn 2 1/2 inch placed 10/26/21  Z46.89     2. Pelvic organ prolapse quantification stage 3 cystocele  N81.10     3. White coat syndrome with diagnosis of hypertension: Home BPs 150/70s, PCP is aware  I10        Sara Haynes will be sen back in 4 months for continued follow up.  Vonn VEAR Inch, MD  10/10/2023 11:18 AM

## 2023-10-22 NOTE — Progress Notes (Unsigned)
 Cardiology Office Note:  .   Date:  10/23/2023  ID:  Sara Haynes, Sara Haynes October 17, 1945, MRN [id number] PCP: Hyacinth Honey, NP  [facility] Providers Cardiologist:  Diannah SHAUNNA Maywood, MD {  History of Present Illness: .   Sara Haynes is a 78 y.o. female  with PMHx of HTN/white coat HTN, HLD who reports to [place] office for 3 m/o follow up.   Last seen in heartcare OV 07/19/2023 by Dr. Mallipeddi for HTN follow-up. Clinic BP 225/136 versus home SBP of 140-190s.  Discontinued all BP medications.  Started nifedipine  60 mg daily.  Recommended BP log. EKG NSR w/o ischemic changes.   Today, reports nonadherence to medications.  She only takes nifedipine  when SBP > 180.  She has only taken nifedipine  once in the past week in 2-3 times in the past month.  Reports feeling cramps, dizziness, LE edema and feeling like she is drunk when taking Nifedipine .  Per patient, home BP mainly 130-150s/70-80s.  Endorses low-sodium heart healthy diet with recently cutting back on bread, soda and candy.  Notes that she is very active via walking and mowing the yard.  Denies any chest pain, SOB, palpitations, syncope.  ROS: 10 point review of system has been reviewed and considered negative except ones been listed in the HPI.   Studies Reviewed: SABRA   ECHO 2021 IMPRESSIONS   1. Left ventricular ejection fraction, by estimation, is 60 to 65%. The left ventricle has normal function. The left ventricle has no regional wall motion abnormalities. Left ventricular diastolic parameters are indeterminate.   2. Right ventricular systolic function is normal. The right ventricular size is normal. There is normal pulmonary artery systolic pressure. The estimated right ventricular systolic pressure is 25.3 mmHg.   3. The mitral valve is grossly normal. No evidence of mitral valve regurgitation.   4. The aortic valve is tricuspid. Aortic valve regurgitation is mild. Mild aortic valve sclerosis is present, with  no evidence of aortic valve  stenosis.   5. The inferior vena cava is normal in size with greater than 50% respiratory variability, suggesting right atrial pressure of 3 mmHg.   Physical Exam:   VS:  BP (!) 150/90 (BP Location: Left Arm, Cuff Size: Normal)   Pulse 98   Ht 5' 5 (1.651 m)   Wt 163 lb 12.8 oz (74.3 kg)   SpO2 98%   BMI 27.26 kg/m    Wt Readings from Last 3 Encounters:  10/23/23 163 lb 12.8 oz (74.3 kg)  10/10/23 163 lb (73.9 kg)  07/19/23 160 lb (72.6 kg)    GEN: Well nourished, well developed in no acute distress while sitting in chair.  NECK: No JVD; No carotid bruits CARDIAC: RRR, no murmurs, rubs, gallops RESPIRATORY:  Clear to auscultation without rales, wheezing or rhonchi  ABDOMEN: Soft, non-tender, non-distended EXTREMITIES:  No edema; No deformity   ASSESSMENT AND PLAN: .   Essential hypertension White coat syndrome with diagnosis of hypertension Previously seen by HTN clinic, recommended doxazosin or clonidine .  Reports somewhat well controlled Home BP: 130-150s/70-80s. She only takes nifedipine  when SBP > 180 (once in the past week, 2-3x in the past month). Reports cramps, dizziness, LE edema and drunk sensation when taking Nifedipine .  BP this OV remains elevated 150/90 with repeat 160/100's.  Discussed various options including  lower medication dose, switching to alternative medication, referral back to HTN clinic, and renal denervation. However, patient prefer to continue with current plans since BP seem more controlled.  Continue on Nifedipine  60 mg daily. Discussed the importance of daily compliance with medication, but patient plan to continue to take when SBP > 180. Encouraged to contact office if SBP remains elevated > 180 or decreased < 90 consistently.  Continue with home BP monitoring. Bring in physical copy of BP log at next OV.  Intolerant to multiple antihypertensive medications in the past.  Did not tolerate amlodipine , hydralazine ,  HCTZ-triamterene , lisinopril , chlorthalidone , spironolactone . Encourage physical activity for 150 minutes per week and heart healthy low sodium diet. Discussed limiting sodium intake to < 2 grams daily.    Hyperlipidemia LDL goal <100 Per chart review, started on Lipitor 20 mg daily in 10/2021. LDL 143 in 08/2022. Patient is not taking Lipitor due to intolerance. Reviewed LDL. Discussed benefits of statins. Patient is not interested in restarting statins. Patient prefers to continue with lifestyle modifications.  Will plan for repeat FLP in next OV.     Dispo: Follow up with VM in 3 month. Bring physical copy of BP log.   Signed, Lorette CINDERELLA Kapur, PA-C

## 2023-10-23 ENCOUNTER — Encounter: Payer: Self-pay | Admitting: Physician Assistant

## 2023-10-23 ENCOUNTER — Ambulatory Visit: Attending: Student | Admitting: Physician Assistant

## 2023-10-23 VITALS — BP 150/90 | HR 98 | Ht 65.0 in | Wt 163.8 lb

## 2023-10-23 DIAGNOSIS — I10 Essential (primary) hypertension: Secondary | ICD-10-CM

## 2023-10-23 DIAGNOSIS — E785 Hyperlipidemia, unspecified: Secondary | ICD-10-CM | POA: Diagnosis not present

## 2023-10-23 NOTE — Patient Instructions (Signed)
 Medication Instructions:   Continue current regimen. Recommend taking Nifedipine  DAILY.   *If you need a refill on your cardiac medications before your next appointment, please call your pharmacy*  Follow-Up: At [facility], you and your health needs are our priority.  As part of our continuing mission to provide you with exceptional heart care, our providers are all part of one team.  This team includes your primary Cardiologist (physician) and Advanced Practice Providers or APPs (Physician Assistants and Nurse Practitioners) who all work together to provide you with the care you need, when you need it.  Your next appointment:   3 month(s)  Provider:   You may see Vishnu P Mallipeddi, MD or one of the following Advanced Practice Providers on your designated Care Team:   [place], PA-C  [place], [place] Olivia [place], [place]     We recommend signing up for the patient portal called MyChart.  Sign up information is provided on this After Visit Summary.  MyChart is used to connect with patients for Virtual Visits (Telemedicine).  Patients are able to view lab/test results, encounter notes, upcoming appointments, etc.  Non-urgent messages can be sent to your provider as well.   To learn more about what you can do with MyChart, go to [email or web address].

## 2023-12-06 ENCOUNTER — Encounter: Payer: Self-pay | Admitting: *Deleted

## 2023-12-06 NOTE — Progress Notes (Signed)
 Sara Haynes                                          MRN: [id number]   12/06/2023   The [facility] Quality Team Specialist reviewed this patient medical record for the purposes of chart review for care gap closure. The following were reviewed: chart review for care gap closure-controlling blood pressure.    [facility] Quality Team

## 2024-01-15 NOTE — Progress Notes (Signed)
 Sara Haynes                                          MRN: [id number]   01/15/2024   The [facility] Quality Team Specialist reviewed this patient medical record for the purposes of chart review for care gap closure. The following were reviewed: chart review for care gap closure-controlling blood pressure.    [facility] Quality Team

## 2024-01-23 ENCOUNTER — Ambulatory Visit: Admitting: Internal Medicine

## 2024-01-30 ENCOUNTER — Ambulatory Visit: Admitting: Internal Medicine

## 2024-02-11 ENCOUNTER — Ambulatory Visit: Admitting: Obstetrics & Gynecology

## 2024-04-21 ENCOUNTER — Ambulatory Visit: Admitting: Internal Medicine
